# Patient Record
Sex: Female | Born: 1955 | Race: White | Hispanic: No | Marital: Single | Smoking: Current some day smoker
Health system: Southern US, Community
[De-identification: ages and names within clinical notes are randomized; demographics above are authoritative.]

## PROBLEM LIST (undated history)

## (undated) DIAGNOSIS — G4730 Sleep apnea, unspecified: Secondary | ICD-10-CM

## (undated) DIAGNOSIS — G709 Myoneural disorder, unspecified: Secondary | ICD-10-CM

## (undated) DIAGNOSIS — R42 Dizziness and giddiness: Secondary | ICD-10-CM

## (undated) DIAGNOSIS — I10 Essential (primary) hypertension: Secondary | ICD-10-CM

## (undated) DIAGNOSIS — F32A Depression, unspecified: Secondary | ICD-10-CM

## (undated) DIAGNOSIS — T4145XA Adverse effect of unspecified anesthetic, initial encounter: Secondary | ICD-10-CM

## (undated) DIAGNOSIS — G2581 Restless legs syndrome: Secondary | ICD-10-CM

## (undated) DIAGNOSIS — E119 Type 2 diabetes mellitus without complications: Secondary | ICD-10-CM

## (undated) DIAGNOSIS — T8859XA Other complications of anesthesia, initial encounter: Secondary | ICD-10-CM

## (undated) DIAGNOSIS — F329 Major depressive disorder, single episode, unspecified: Secondary | ICD-10-CM

## (undated) DIAGNOSIS — M1990 Unspecified osteoarthritis, unspecified site: Secondary | ICD-10-CM

## (undated) HISTORY — PX: DILATION AND CURETTAGE OF UTERUS: SHX78

## (undated) HISTORY — PX: TONSILLECTOMY: SUR1361

## (undated) HISTORY — PX: BREAST EXCISIONAL BIOPSY: SUR124

## (undated) HISTORY — DX: Myoneural disorder, unspecified: G70.9

## (undated) HISTORY — DX: Dizziness and giddiness: R42

---

## 2014-05-17 HISTORY — PX: OTHER SURGICAL HISTORY: SHX169

## 2015-09-15 ENCOUNTER — Other Ambulatory Visit (HOSPITAL_COMMUNITY): Payer: Self-pay | Admitting: Orthopedic Surgery

## 2015-09-15 DIAGNOSIS — M25561 Pain in right knee: Secondary | ICD-10-CM

## 2015-09-19 ENCOUNTER — Encounter (HOSPITAL_COMMUNITY)
Admission: RE | Admit: 2015-09-19 | Discharge: 2015-09-19 | Disposition: A | Discharge status: Home / Self Care | Payer: Worker's Compensation | Source: Ambulatory Visit | Attending: Orthopedic Surgery | Admitting: Orthopedic Surgery

## 2015-09-19 DIAGNOSIS — M25561 Pain in right knee: Secondary | ICD-10-CM

## 2015-09-19 MED ORDER — TECHNETIUM TC 99M MEDRONATE IV KIT
26.5000 | PACK | Freq: Once | INTRAVENOUS | Status: AC | PRN
  Administered 2015-09-19: 26.5 via INTRAVENOUS

## 2016-11-26 ENCOUNTER — Other Ambulatory Visit (HOSPITAL_COMMUNITY): Payer: Self-pay | Admitting: Emergency Medicine

## 2016-11-26 NOTE — Progress Notes (Signed)
LOV Dr Eloise Harman 09-14-16 on chart/epic

## 2016-11-26 NOTE — Patient Instructions (Addendum)
Fallen A Jarboe  11/26/2016   Your procedure is scheduled on: 12-06-16  Report to [facility] Main  Entrance   Report to admitting at 515AM   Call this number if you have problems the morning of surgery  [phone]   Remember: ONLY 1 PERSON MAY GO WITH YOU TO SHORT STAY TO GET  READY MORNING OF YOUR SURGERY.   Do not eat food or drink liquids After Midnight.      Take these medicines the morning of surgery with A SIP OF WATER: amlodipine(norvasc), citalopram(celexa), gabapentin     Remember to bring your CPAP mask and tubing to the hospital . The device will be provided for you!                               You may not have any metal on your body including hair pins and              piercings  Do not wear jewelry, make-up, lotions, powders or perfumes, deodorant             Do not wear nail polish.  Do not shave  48 hours prior to surgery.           Do not bring valuables to the hospital. [place] IS NOT             RESPONSIBLE   FOR VALUABLES.  Contacts, dentures or bridgework may not be worn into surgery.  Leave suitcase in the car. After surgery it may be brought to your room.                Please read over the following fact sheets you were given: _____________________________________________________________________   _____________________________________________________________________  How to Manage Your Diabetes Before and After Surgery  Why is it important to control my blood sugar before and after surgery? . Improving blood sugar levels before and after surgery helps healing and can limit problems. . A way of improving blood sugar control is eating a healthy diet by: o  Eating less sugar and carbohydrates o  Increasing activity/exercise o  Talking with your doctor about reaching your blood sugar goals . High blood sugars (greater than 180 mg/dL) can raise your risk of infections and slow your recovery, so you will need to focus on  controlling your diabetes during the weeks before surgery. . Make sure that the doctor who takes care of your diabetes knows about your planned surgery including the date and location.  How do I manage my blood sugar before surgery? . Check your blood sugar at least 4 times a day, starting 2 days before surgery, to make sure that the level is not too high or low. o Check your blood sugar the morning of your surgery when you wake up and every 2 hours until you get to the Short Stay unit. . If your blood sugar is less than 70 mg/dL, you will need to treat for low blood sugar: o Do not take insulin. o Treat a low blood sugar (less than 70 mg/dL) with  cup of clear juice (cranberry or apple), 4 glucose tablets, OR glucose gel. o Recheck blood sugar in 15 minutes after treatment (to make sure it is greater than 70 mg/dL). If your blood sugar is not greater than 70 mg/dL on recheck, call [phone] for  further instructions. . Report your blood sugar to the short stay nurse when you get to Short Stay.  . If you are admitted to the hospital after surgery: o Your blood sugar will be checked by the staff and you will probably be given insulin after surgery (instead of oral diabetes medicines) to make sure you have good blood sugar levels. o The goal for blood sugar control after surgery is 80-180 mg/dL.   WHAT DO I DO ABOUT MY DIABETES MEDICATION?   . THE NIGHT BEFORE SURGERY, take only 5 units of TRESIBA insulin at bedtime.       Patient Signature:  Date:   Nurse Signature:  Date:   Reviewed and Endorsed by [facility] Patient Education Committee, August 2015  [facility] - Preparing for Surgery Before surgery, you can play an important role.  Because skin is not sterile, your skin needs to be as free of germs as possible.  You can reduce the number of germs on your skin by washing with CHG (chlorahexidine gluconate) soap before surgery.  CHG is an antiseptic cleaner which kills germs and  bonds with the skin to continue killing germs even after washing. Please DO NOT use if you have an allergy to CHG or antibacterial soaps.  If your skin becomes reddened/irritated stop using the CHG and inform your nurse when you arrive at Short Stay. Do not shave (including legs and underarms) for at least 48 hours prior to the first CHG shower.  You may shave your face/neck. Please follow these instructions carefully:  1.  Shower with CHG Soap the night before surgery and the  morning of Surgery.  2.  If you choose to wash your hair, wash your hair first as usual with your  normal  shampoo.  3.  After you shampoo, rinse your hair and body thoroughly to remove the  shampoo.                           4.  Use CHG as you would any other liquid soap.  You can apply chg directly  to the skin and wash                       Gently with a scrungie or clean washcloth.  5.  Apply the CHG Soap to your body ONLY FROM THE NECK DOWN.   Do not use on face/ open                           Wound or open sores. Avoid contact with eyes, ears mouth and genitals (private parts).                       Wash face,  Genitals (private parts) with your normal soap.             6.  Wash thoroughly, paying special attention to the area where your surgery  will be performed.  7.  Thoroughly rinse your body with warm water from the neck down.  8.  DO NOT shower/wash with your normal soap after using and rinsing off  the CHG Soap.                9.  Pat yourself dry with a clean towel.            10.  Wear clean pajamas.  11.  Place clean sheets on your bed the night of your first shower and do not  sleep with pets. Day of Surgery : Do not apply any lotions/deodorants the morning of surgery.  Please wear clean clothes to the hospital/surgery center.  FAILURE TO FOLLOW THESE INSTRUCTIONS MAY RESULT IN THE CANCELLATION OF YOUR SURGERY PATIENT SIGNATURE_________________________________  NURSE  SIGNATURE__________________________________  ________________________________________________________________________   Adam Phenix  An incentive spirometer is a tool that can help keep your lungs clear and active. This tool measures how well you are filling your lungs with each breath. Taking long deep breaths may help reverse or decrease the chance of developing breathing (pulmonary) problems (especially infection) following:  A long period of time when you are unable to move or be active. BEFORE THE PROCEDURE   If the spirometer includes an indicator to show your [place] effort, your nurse or respiratory therapist will set it to a desired goal.  If possible, sit up straight or lean slightly forward. Try not to slouch.  Hold the incentive spirometer in an upright position. INSTRUCTIONS FOR USE  1. Sit on the edge of your bed if possible, or sit up as far as you can in bed or on a chair. 2. Hold the incentive spirometer in an upright position. 3. Breathe out normally. 4. Place the mouthpiece in your mouth and seal your lips tightly around it. 5. Breathe in slowly and as deeply as possible, raising the piston or the ball toward the top of the column. 6. Hold your breath for 3-5 seconds or for as long as possible. Allow the piston or ball to fall to the bottom of the column. 7. Remove the mouthpiece from your mouth and breathe out normally. 8. Rest for a few seconds and repeat Steps 1 through 7 at least 10 times every 1-2 hours when you are awake. Take your time and take a few normal breaths between deep breaths. 9. The spirometer may include an indicator to show your [place] effort. Use the indicator as a goal to work toward during each repetition. 10. After each set of 10 deep breaths, practice coughing to be sure your lungs are clear. If you have an incision (the cut made at the time of surgery), support your incision when coughing by placing a pillow or rolled up towels firmly  against it. Once you are able to get out of bed, walk around indoors and cough well. You may stop using the incentive spirometer when instructed by your caregiver.  RISKS AND COMPLICATIONS  Take your time so you do not get dizzy or light-headed.  If you are in pain, you may need to take or ask for pain medication before doing incentive spirometry. It is harder to take a deep breath if you are having pain. AFTER USE  Rest and breathe slowly and easily.  It can be helpful to keep track of a log of your progress. Your caregiver can provide you with a simple table to help with this. If you are using the spirometer at home, follow these instructions: [place] IF:   You are having difficultly using the spirometer.  You have trouble using the spirometer as often as instructed.  Your pain medication is not giving enough relief while using the spirometer.  You develop fever of 100.5 F (38.1 C) or higher. SEEK IMMEDIATE MEDICAL CARE IF:   You cough up bloody sputum that had not been present before.  You develop fever of 102 F (38.9 C)  or greater.  You develop worsening pain at or near the incision site. MAKE SURE YOU:   Understand these instructions.  Will watch your condition.  Will get help right away if you are not doing well or get worse. Document Released: 09/13/2006 Document Revised: 07/26/2011 Document Reviewed: 11/14/2006 Exit[employer] Patient Information 2014 Exit[employer], [place].   ________________________________________________________________________  WHAT IS A BLOOD TRANSFUSION? Blood Transfusion Information  A transfusion is the replacement of blood or some of its parts. Blood is made up of multiple cells which provide different functions.  Red blood cells carry oxygen and are used for blood loss replacement.  White blood cells fight against infection.  Platelets control bleeding.  Plasma helps clot blood.  Other blood products are available for  specialized needs, such as hemophilia or other clotting disorders. BEFORE THE TRANSFUSION  Who gives blood for transfusions?   Healthy volunteers who are fully evaluated to make sure their blood is safe. This is blood bank blood. Transfusion therapy is the safest it has ever been in the practice of medicine. Before blood is taken from a donor, a complete history is taken to make sure that person has no history of diseases nor engages in risky social behavior (examples are intravenous drug use or sexual activity with multiple partners). The donor's travel history is screened to minimize risk of transmitting infections, such as malaria. The donated blood is tested for signs of infectious diseases, such as HIV and hepatitis. The blood is then tested to be sure it is compatible with you in order to minimize the chance of a transfusion reaction. If you or a relative donates blood, this is often done in anticipation of surgery and is not appropriate for emergency situations. It takes many days to process the donated blood. RISKS AND COMPLICATIONS Although transfusion therapy is very safe and saves many lives, the main dangers of transfusion include:   Getting an infectious disease.  Developing a transfusion reaction. This is an allergic reaction to something in the blood you were given. Every precaution is taken to prevent this. The decision to have a blood transfusion has been considered carefully by your caregiver before blood is given. Blood is not given unless the benefits outweigh the risks. AFTER THE TRANSFUSION  Right after receiving a blood transfusion, you will usually feel much better and more energetic. This is especially true if your red blood cells have gotten low (anemic). The transfusion raises the level of the red blood cells which carry oxygen, and this usually causes an energy increase.  The nurse administering the transfusion will monitor you carefully for complications. HOME CARE  INSTRUCTIONS  No special instructions are needed after a transfusion. You may find your energy is better. Speak with your caregiver about any limitations on activity for underlying diseases you may have. SEEK MEDICAL CARE IF:   Your condition is not improving after your transfusion.  You develop redness or irritation at the intravenous (IV) site. SEEK IMMEDIATE MEDICAL CARE IF:  Any of the following symptoms occur over the next 12 hours:  Shaking chills.  You have a temperature by mouth above 102 F (38.9 C), not controlled by medicine.  Chest, back, or muscle pain.  People around you feel you are not acting correctly or are confused.  Shortness of breath or difficulty breathing.  Dizziness and fainting.  You get a rash or develop hives.  You have a decrease in urine output.  Your urine turns a dark color or changes to pink, red,  or brown. Any of the following symptoms occur over the next 10 days:  You have a temperature by mouth above 102 F (38.9 C), not controlled by medicine.  Shortness of breath.  Weakness after normal activity.  The white part of the eye turns yellow (jaundice).  You have a decrease in the amount of urine or are urinating less often.  Your urine turns a dark color or changes to pink, red, or brown. Document Released: 04/30/2000 Document Revised: 07/26/2011 Document Reviewed: 12/18/2007 [facility] Patient Information 2014 [place], [place].  _______________________________________________________________________

## 2016-11-29 ENCOUNTER — Ambulatory Visit (HOSPITAL_COMMUNITY)
Admission: RE | Admit: 2016-11-29 | Discharge: 2016-11-29 | Disposition: A | Discharge status: Home / Self Care | Payer: Worker's Compensation | Source: Ambulatory Visit | Attending: Anesthesiology | Admitting: Anesthesiology

## 2016-11-29 ENCOUNTER — Encounter (HOSPITAL_COMMUNITY)
Admission: RE | Admit: 2016-11-29 | Discharge: 2016-11-29 | Disposition: A | Discharge status: Home / Self Care | Payer: Worker's Compensation | Source: Ambulatory Visit | Attending: Orthopedic Surgery | Admitting: Orthopedic Surgery

## 2016-11-29 ENCOUNTER — Encounter (HOSPITAL_COMMUNITY): Payer: Self-pay

## 2016-11-29 ENCOUNTER — Encounter (INDEPENDENT_AMBULATORY_CARE_PROVIDER_SITE_OTHER): Payer: Self-pay

## 2016-11-29 DIAGNOSIS — I10 Essential (primary) hypertension: Secondary | ICD-10-CM | POA: Diagnosis not present

## 2016-11-29 DIAGNOSIS — Z209 Contact with and (suspected) exposure to unspecified communicable disease: Secondary | ICD-10-CM

## 2016-11-29 DIAGNOSIS — E119 Type 2 diabetes mellitus without complications: Secondary | ICD-10-CM | POA: Diagnosis not present

## 2016-11-29 DIAGNOSIS — T8484XA Pain due to internal orthopedic prosthetic devices, implants and grafts, initial encounter: Secondary | ICD-10-CM | POA: Insufficient documentation

## 2016-11-29 DIAGNOSIS — F329 Major depressive disorder, single episode, unspecified: Secondary | ICD-10-CM | POA: Insufficient documentation

## 2016-11-29 DIAGNOSIS — Y831 Surgical operation with implant of artificial internal device as the cause of abnormal reaction of the patient, or of later complication, without mention of misadventure at the time of the procedure: Secondary | ICD-10-CM | POA: Diagnosis not present

## 2016-11-29 DIAGNOSIS — Z79899 Other long term (current) drug therapy: Secondary | ICD-10-CM | POA: Insufficient documentation

## 2016-11-29 DIAGNOSIS — G2581 Restless legs syndrome: Secondary | ICD-10-CM | POA: Insufficient documentation

## 2016-11-29 HISTORY — DX: Sleep apnea, unspecified: G47.30

## 2016-11-29 HISTORY — DX: Other complications of anesthesia, initial encounter: T88.59XA

## 2016-11-29 HISTORY — DX: Restless legs syndrome: G25.81

## 2016-11-29 HISTORY — DX: Unspecified osteoarthritis, unspecified site: M19.90

## 2016-11-29 HISTORY — DX: Major depressive disorder, single episode, unspecified: F32.9

## 2016-11-29 HISTORY — DX: Essential (primary) hypertension: I10

## 2016-11-29 HISTORY — DX: Type 2 diabetes mellitus without complications: E11.9

## 2016-11-29 HISTORY — DX: Depression, unspecified: F32.A

## 2016-11-29 HISTORY — DX: Adverse effect of unspecified anesthetic, initial encounter: T41.45XA

## 2016-11-29 LAB — CBC
HCT: 44 % (ref 36.0–46.0)
Hemoglobin: 15.1 g/dL — ABNORMAL HIGH (ref 12.0–15.0)
MCH: 30.8 pg (ref 26.0–34.0)
MCHC: 34.3 g/dL (ref 30.0–36.0)
MCV: 89.8 fL (ref 78.0–100.0)
PLATELETS: 160 10*3/uL (ref 150–400)
RBC: 4.9 MIL/uL (ref 3.87–5.11)
RDW: 13.3 % (ref 11.5–15.5)
WBC: 5.8 10*3/uL (ref 4.0–10.5)

## 2016-11-29 LAB — BASIC METABOLIC PANEL
Anion gap: 7 (ref 5–15)
BUN: 11 mg/dL (ref 6–20)
CALCIUM: 9.5 mg/dL (ref 8.9–10.3)
CO2: 27 mmol/L (ref 22–32)
CREATININE: 0.8 mg/dL (ref 0.44–1.00)
Chloride: 106 mmol/L (ref 101–111)
GFR calc non Af Amer: >60 mL/min (ref >60)
Glucose, Bld: 116 mg/dL — ABNORMAL HIGH (ref 65–99)
Potassium: 4.4 mmol/L (ref 3.5–5.1)
SODIUM: 140 mmol/L (ref 135–145)

## 2016-11-29 LAB — GLUCOSE, CAPILLARY: GLUCOSE-CAPILLARY: 121 mg/dL — AB (ref 65–99)

## 2016-11-29 LAB — SURGICAL PCR SCREEN
MRSA, PCR: NEGATIVE
Staphylococcus aureus: NEGATIVE

## 2016-11-29 LAB — ABO/RH: ABO/RH(D): A POS

## 2016-11-30 LAB — HEMOGLOBIN A1C
Hgb A1c MFr Bld: 6.7 % — ABNORMAL HIGH (ref 4.8–5.6)
Mean Plasma Glucose: 146 mg/dL

## 2016-12-02 NOTE — Progress Notes (Signed)
RN called patient to update about surgery time change. Patient did not answer, RN LVMM directing patient to call back for updates on upcoming procedure .

## 2016-12-02 NOTE — Progress Notes (Signed)
Patient called back and RN informed her of new time of surgery at 715am and that she needed to arrive to 1st floor short stay area no later than 515am to check in and that she would need to stop all oral food and drink after midnight prior to her surgery day . Patient verbalized understanding. Patient also informed RN that since her pre-op visit she had seen her PCP for c/o sore throat and runny nose. Strep result negative and PCP suggests upper nasal congestion is related to CPAP mask and instructed her to start on mucinex as needed, patient wondering if this is an okay medication for surgery. RN informed she may take mucinex as needed day of surgery as instructed by her PCP with only a small sip of water. Pt agreeable to this .

## 2016-12-02 NOTE — H&P (Signed)
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  Subjective:  Chief Complaint:  Right knee pain S/P UKR  HPI: Kelli Ruiz, 61 y.o. 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 The indications for the revision of the total knee arthroplasty are progressive or substantial perporsthetic bone loss. Onset of symptoms was gradual starting 2+ years ago with gradually worsening course since that time.  Prior procedures on the right knee(s) include unicompartmental arthroplasty.  Patient currently rates pain in the right knee(s) at 7 out of 10 with activity. There is worsening of pain with activity and weight bearing, pain that interferes with activities of daily living, pain with passive range of motion, crepitus and joint swelling.  Patient has evidence of periarticular osteophytes and joint space narrowing by imaging studies. This condition presents safety issues increasing the risk of falls.  There is no current active infection.   Risks, benefits and expectations were discussed with the patient.  Risks including but not limited to the risk of anesthesia, blood clots, nerve damage, blood vessel damage, failure of the prosthesis, infection and up to and including death.  Patient understand the risks, benefits and expectations and wishes to proceed with surgery.   PCP: Brayton Layman, MD  D/C Plans:       Home   Post-op Meds:       No Rx given   Tranexamic Acid:      To be given - IV   Decadron:      Is to be given  FYI:     ASA  Norco  CPAP  DM - Insulin only at night if BGL over 120  DME:    Rx given for - RW and  3-n-1  PT:   OPPT Rx given    Past Medical History:  Diagnosis Date  . Arthritis   . Complication of anesthesia    " they had a little bit of a hard time waking me after they took a nerve out of my leg"   . Depression   . Diabetes mellitus without complication (HCC)   . Hypertension   . RLS (restless legs syndrome)   . Sleep apnea     Past Surgical History:  Procedure Laterality Date  . DILATION AND CURETTAGE OF UTERUS  12- 2014 or 2015  . TONSILLECTOMY    . unicompartmental knee  2016    No prescriptions prior to admission.   No Known Allergies   Social History  Substance Use Topics  . Smoking status: Current Every Day Smoker    Years: 20.00    Types: Cigarettes  . Smokeless tobacco: Never Used  . Alcohol use No       Review of Systems  Constitutional: Negative.   HENT: Negative.   Eyes: Negative.   Respiratory: Negative.   Cardiovascular: Negative.   Gastrointestinal: Negative.   Genitourinary: Negative.   Musculoskeletal: Positive for joint pain.  Skin: Negative.   Neurological: Negative.   Endo/Heme/Allergies: Negative.   Psychiatric/Behavioral: Positive for depression.     Objective:  Physical Exam  Constitutional: She is oriented to person, place, and time. She appears well-developed.  HENT:  Head: Normocephalic.  Eyes: Pupils are  equal, round, and reactive to light.  Neck: Neck supple. No JVD present. No tracheal deviation present. No thyromegaly present.  Cardiovascular: Normal rate, regular rhythm and intact distal pulses.   Respiratory: Effort normal and breath sounds normal. No respiratory distress. She has no wheezes.  GI: Soft. There is no tenderness. There is no guarding.  Musculoskeletal:       Right knee: She exhibits decreased range of motion, laceration (healed previous incision) and bony tenderness. She exhibits no ecchymosis, no deformity and no erythema. Tenderness found.  Lymphadenopathy:    She has no cervical adenopathy.   Neurological: She is alert and oriented to person, place, and time.  Skin: Skin is warm and dry.  Psychiatric: She has a normal mood and affect.      Labs:  Estimated body mass index is 31.3 kg/m as calculated from the following:   Height as of 11/29/16: 5' 4.5" (1.638 m).   Weight as of 11/29/16: 84 kg (185 lb 3.2 oz).  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  The bone quality appears to be good for age and reported activity level. There is previous UKR.  Assessment/Plan:  Right knee with failed previous arthroplasty.   The patient history, physical examination, clinical judgment of the provider and imaging studies are consistent with failure of the right knee(s), previous unicompartmental  knee arthroplasty. Revision total knee arthroplasty is deemed medically necessary. The treatment options including medical management, injection therapy, arthroscopy and revision arthroplasty were discussed at length. The risks and benefits of revision total knee arthroplasty were presented and reviewed. The risks due to aseptic loosening, infection, stiffness, patella tracking problems, thromboembolic complications and other imponderables were discussed. The patient acknowledged the explanation, agreed to proceed with the plan and consent was signed. Patient is being admitted for inpatient treatment for surgery, pain control, PT, OT, prophylactic antibiotics, VTE prophylaxis, progressive ambulation and ADL's and discharge planning.The patient is planning to be discharged home.    Anastasio Auerbach Aaliah Jorgenson   PA-C  12/02/2016, 2:02 PM

## 2016-12-04 ENCOUNTER — Encounter (HOSPITAL_COMMUNITY): Payer: Self-pay | Admitting: Certified Registered Nurse Anesthetist

## 2016-12-06 ENCOUNTER — Inpatient Hospital Stay (HOSPITAL_COMMUNITY)
Admission: RE | Admit: 2016-12-06 | Payer: Worker's Compensation | Source: Ambulatory Visit | Admitting: Orthopedic Surgery

## 2016-12-06 ENCOUNTER — Encounter (HOSPITAL_COMMUNITY): Admission: RE | Payer: Self-pay | Source: Ambulatory Visit

## 2016-12-06 LAB — TYPE AND SCREEN
ABO/RH(D): A POS
Antibody Screen: NEGATIVE

## 2016-12-06 SURGERY — CONVERSION, ARTHROPLASTY, KNEE, PARTIAL, TO TOTAL KNEE ARTHROPLASTY
Anesthesia: Spinal | Laterality: Right

## 2016-12-06 MED ORDER — CEFAZOLIN SODIUM-DEXTROSE 2-4 GM/100ML-% IV SOLN: 2.0000 g | INTRAVENOUS | Status: DC

## 2016-12-06 MED ORDER — CHLORHEXIDINE GLUCONATE 4 % EX LIQD: 60.0000 mL | Freq: Once | CUTANEOUS | Status: DC

## 2017-01-12 NOTE — Patient Instructions (Addendum)
Kelli Ruiz  01/12/2017   Your procedure is scheduled on:01-18-17    Report to [facility] Main  Entrance Report to admitting at 9:00 AM   Call this number if you have problems the morning of surgery  [phone]   Remember: ONLY 1 PERSON MAY GO WITH YOU TO SHORT STAY TO GET  READY MORNING OF YOUR SURGERY.  Do not eat food or drink liquids :After Midnight.     Take these medicines the morning of surgery with A SIP OF WATER: Amlodopine (Norvasc), Citalopram (Celexa) and Gabapentin (Neurontin)  DO NOT TAKE ANY DIABETIC MEDICATIONS DAY OF YOUR SURGERY                               You may not have any metal on your body including hair pins and              piercings  Do not wear jewelry, make-up, lotions, powders or perfumes, deodorant             Do not wear nail polish.  Do not shave  48 hours prior to surgery.                 Do not bring valuables to the hospital. [facility] IS NOT             RESPONSIBLE   FOR VALUABLES.  Contacts, dentures or bridgework may not be worn into surgery.  Leave suitcase in the car. After surgery it may be brought to your room.   Please bring your mask and tubing on the day of your procedure                Please read over the following fact sheets you were given: _____________________________________________________________________  How to Manage Your Diabetes Before and After Surgery  Why is it important to control my blood sugar before and after surgery? . Improving blood sugar levels before and after surgery helps healing and can limit problems. . A way of improving blood sugar control is eating a healthy diet by: o  Eating less sugar and carbohydrates o  Increasing activity/exercise o  Talking with your doctor about reaching your blood sugar goals . High blood sugars (greater than 180 mg/dL) can raise your risk of infections and slow your recovery, so you will need to focus on controlling your diabetes during the  weeks before surgery. . Make sure that the doctor who takes care of your diabetes knows about your planned surgery including the date and location.  How do I manage my blood sugar before surgery? . Check your blood sugar at least 4 times a day, starting 2 days before surgery, to make sure that the level is not too high or low. o Check your blood sugar the morning of your surgery when you wake up and every 2 hours until you get to the Short Stay unit. . If your blood sugar is less than 70 mg/dL, you will need to treat for low blood sugar: o Do not take insulin. o Treat a low blood sugar (less than 70 mg/dL) with  cup of clear juice (cranberry or apple), 4 glucose tablets, OR glucose gel. o Recheck blood sugar in 15 minutes after treatment (to make sure it is greater than 70 mg/dL). If your blood sugar is not greater than  70 mg/dL on recheck, call [phone] for further instructions. . Report your blood sugar to the short stay nurse when you get to Short Stay.  . If you are admitted to the hospital after surgery: o Your blood sugar will be checked by the staff and you will probably be given insulin after surgery (instead of oral diabetes medicines) to make sure you have good blood sugar levels. o The goal for blood sugar control after surgery is 80-180 mg/dL.   WHAT DO I DO ABOUT MY DIABETES MEDICATION?  Marland Kitchen Do not take oral diabetes medicines (pills) the morning of surgery.  . THE NIGHT BEFORE SURGERY, take 5 units of your Tresiba (Degludec) insulin.        Patient Signature:  Date:   Nurse Signature:  Date:   Reviewed and Endorsed by [facility] Patient Education Committee, August 2015           [facility] - Preparing for Surgery Before surgery, you can play an important role.  Because skin is not sterile, your skin needs to be as free of germs as possible.  You can reduce the number of germs on your skin by washing with CHG (chlorahexidine gluconate) soap before surgery.  CHG is  an antiseptic cleaner which kills germs and bonds with the skin to continue killing germs even after washing. Please DO NOT use if you have an allergy to CHG or antibacterial soaps.  If your skin becomes reddened/irritated stop using the CHG and inform your nurse when you arrive at Short Stay. Do not shave (including legs and underarms) for at least 48 hours prior to the first CHG shower.  You may shave your face/neck. Please follow these instructions carefully:  1.  Shower with CHG Soap the night before surgery and the  morning of Surgery.  2.  If you choose to wash your hair, wash your hair first as usual with your  normal  shampoo.  3.  After you shampoo, rinse your hair and body thoroughly to remove the  shampoo.                           4.  Use CHG as you would any other liquid soap.  You can apply chg directly  to the skin and wash                       Gently with a scrungie or clean washcloth.  5.  Apply the CHG Soap to your body ONLY FROM THE NECK DOWN.   Do not use on face/ open                           Wound or open sores. Avoid contact with eyes, ears mouth and genitals (private parts).                       Wash face,  Genitals (private parts) with your normal soap.             6.  Wash thoroughly, paying special attention to the area where your surgery  will be performed.  7.  Thoroughly rinse your body with warm water from the neck down.  8.  DO NOT shower/wash with your normal soap after using and rinsing off  the CHG Soap.  9.  Pat yourself dry with a clean towel.            10.  Wear clean pajamas.            11.  Place clean sheets on your bed the night of your first shower and do not  sleep with pets. Day of Surgery : Do not apply any lotions/deodorants the morning of surgery.  Please wear clean clothes to the hospital/surgery center.  FAILURE TO FOLLOW THESE INSTRUCTIONS MAY RESULT IN THE CANCELLATION OF YOUR SURGERY PATIENT  SIGNATURE_________________________________  NURSE SIGNATURE__________________________________  ________________________________________________________________________   Adam Phenix  An incentive spirometer is a tool that can help keep your lungs clear and active. This tool measures how well you are filling your lungs with each breath. Taking long deep breaths may help reverse or decrease the chance of developing breathing (pulmonary) problems (especially infection) following:  A long period of time when you are unable to move or be active. BEFORE THE PROCEDURE   If the spirometer includes an indicator to show your best effort, your nurse or respiratory therapist will set it to a desired goal.  If possible, sit up straight or lean slightly forward. Try not to slouch.  Hold the incentive spirometer in an upright position. INSTRUCTIONS FOR USE  1. Sit on the edge of your bed if possible, or sit up as far as you can in bed or on a chair. 2. Hold the incentive spirometer in an upright position. 3. Breathe out normally. 4. Place the mouthpiece in your mouth and seal your lips tightly around it. 5. Breathe in slowly and as deeply as possible, raising the piston or the ball toward the top of the column. 6. Hold your breath for 3-5 seconds or for as long as possible. Allow the piston or ball to fall to the bottom of the column. 7. Remove the mouthpiece from your mouth and breathe out normally. 8. Rest for a few seconds and repeat Steps 1 through 7 at least 10 times every 1-2 hours when you are awake. Take your time and take a few normal breaths between deep breaths. 9. The spirometer may include an indicator to show your best effort. Use the indicator as a goal to work toward during each repetition. 10. After each set of 10 deep breaths, practice coughing to be sure your lungs are clear. If you have an incision (the cut made at the time of surgery), support your incision when coughing  by placing a pillow or rolled up towels firmly against it. Once you are able to get out of bed, walk around indoors and cough well. You may stop using the incentive spirometer when instructed by your caregiver.  RISKS AND COMPLICATIONS  Take your time so you do not get dizzy or light-headed.  If you are in pain, you may need to take or ask for pain medication before doing incentive spirometry. It is harder to take a deep breath if you are having pain. AFTER USE  Rest and breathe slowly and easily.  It can be helpful to keep track of a log of your progress. Your caregiver can provide you with a simple table to help with this. If you are using the spirometer at home, follow these instructions: [place] IF:   You are having difficultly using the spirometer.  You have trouble using the spirometer as often as instructed.  Your pain medication is not giving enough relief while using the spirometer.  You  develop fever of 100.5 F (38.1 C) or higher. SEEK IMMEDIATE MEDICAL CARE IF:   You cough up bloody sputum that had not been present before.  You develop fever of 102 F (38.9 C) or greater.  You develop worsening pain at or near the incision site. MAKE SURE YOU:   Understand these instructions.  Will watch your condition.  Will get help right away if you are not doing well or get worse. Document Released: 09/13/2006 Document Revised: 07/26/2011 Document Reviewed: 11/14/2006 Exit[employer] Patient Information 2014 Exit[employer], [place].   ________________________________________________________________________  WHAT IS A BLOOD TRANSFUSION? Blood Transfusion Information  A transfusion is the replacement of blood or some of its parts. Blood is made up of multiple cells which provide different functions.  Red blood cells carry oxygen and are used for blood loss replacement.  White blood cells fight against infection.  Platelets control bleeding.  Plasma helps clot  blood.  Other blood products are available for specialized needs, such as hemophilia or other clotting disorders. BEFORE THE TRANSFUSION  Who gives blood for transfusions?   Healthy volunteers who are fully evaluated to make sure their blood is safe. This is blood bank blood. Transfusion therapy is the safest it has ever been in the practice of medicine. Before blood is taken from a donor, a complete history is taken to make sure that person has no history of diseases nor engages in risky social behavior (examples are intravenous drug use or sexual activity with multiple partners). The donor's travel history is screened to minimize risk of transmitting infections, such as malaria. The donated blood is tested for signs of infectious diseases, such as HIV and hepatitis. The blood is then tested to be sure it is compatible with you in order to minimize the chance of a transfusion reaction. If you or a relative donates blood, this is often done in anticipation of surgery and is not appropriate for emergency situations. It takes many days to process the donated blood. RISKS AND COMPLICATIONS Although transfusion therapy is very safe and saves many lives, the main dangers of transfusion include:   Getting an infectious disease.  Developing a transfusion reaction. This is an allergic reaction to something in the blood you were given. Every precaution is taken to prevent this. The decision to have a blood transfusion has been considered carefully by your caregiver before blood is given. Blood is not given unless the benefits outweigh the risks. AFTER THE TRANSFUSION  Right after receiving a blood transfusion, you will usually feel much better and more energetic. This is especially true if your red blood cells have gotten low (anemic). The transfusion raises the level of the red blood cells which carry oxygen, and this usually causes an energy increase.  The nurse administering the transfusion will monitor  you carefully for complications. HOME CARE INSTRUCTIONS  No special instructions are needed after a transfusion. You may find your energy is better. Speak with your caregiver about any limitations on activity for underlying diseases you may have. SEEK MEDICAL CARE IF:   Your condition is not improving after your transfusion.  You develop redness or irritation at the intravenous (IV) site. SEEK IMMEDIATE MEDICAL CARE IF:  Any of the following symptoms occur over the next 12 hours:  Shaking chills.  You have a temperature by mouth above 102 F (38.9 C), not controlled by medicine.  Chest, back, or muscle pain.  People around you feel you are not acting correctly or are confused.  Shortness of  breath or difficulty breathing.  Dizziness and fainting.  You get a rash or develop hives.  You have a decrease in urine output.  Your urine turns a dark color or changes to pink, red, or brown. Any of the following symptoms occur over the next 10 days:  You have a temperature by mouth above 102 F (38.9 C), not controlled by medicine.  Shortness of breath.  Weakness after normal activity.  The white part of the eye turns yellow (jaundice).  You have a decrease in the amount of urine or are urinating less often.  Your urine turns a dark color or changes to pink, red, or brown. Document Released: 04/30/2000 Document Revised: 07/26/2011 Document Reviewed: 12/18/2007 [facility] Patient Information 2014 [place], [place].  _______________________________________________________________________

## 2017-01-12 NOTE — Progress Notes (Signed)
11-29-16 (EPIC) EKG, CXR,  HGA1C 6.7

## 2017-01-13 ENCOUNTER — Encounter (HOSPITAL_COMMUNITY): Payer: Self-pay

## 2017-01-13 ENCOUNTER — Encounter (INDEPENDENT_AMBULATORY_CARE_PROVIDER_SITE_OTHER): Payer: Self-pay

## 2017-01-13 ENCOUNTER — Encounter (HOSPITAL_COMMUNITY)
Admission: RE | Admit: 2017-01-13 | Discharge: 2017-01-13 | Disposition: A | Discharge status: Home / Self Care | Payer: Worker's Compensation | Source: Ambulatory Visit | Attending: Orthopedic Surgery | Admitting: Orthopedic Surgery

## 2017-01-13 DIAGNOSIS — E119 Type 2 diabetes mellitus without complications: Secondary | ICD-10-CM | POA: Insufficient documentation

## 2017-01-13 DIAGNOSIS — Z01812 Encounter for preprocedural laboratory examination: Secondary | ICD-10-CM | POA: Insufficient documentation

## 2017-01-13 DIAGNOSIS — M1990 Unspecified osteoarthritis, unspecified site: Secondary | ICD-10-CM | POA: Insufficient documentation

## 2017-01-13 DIAGNOSIS — G4730 Sleep apnea, unspecified: Secondary | ICD-10-CM | POA: Insufficient documentation

## 2017-01-13 DIAGNOSIS — G2581 Restless legs syndrome: Secondary | ICD-10-CM | POA: Insufficient documentation

## 2017-01-13 DIAGNOSIS — T84092A Other mechanical complication of internal right knee prosthesis, initial encounter: Secondary | ICD-10-CM | POA: Diagnosis not present

## 2017-01-13 DIAGNOSIS — I10 Essential (primary) hypertension: Secondary | ICD-10-CM | POA: Insufficient documentation

## 2017-01-13 DIAGNOSIS — F329 Major depressive disorder, single episode, unspecified: Secondary | ICD-10-CM | POA: Insufficient documentation

## 2017-01-13 LAB — BASIC METABOLIC PANEL
Anion gap: 8 (ref 5–15)
BUN: 13 mg/dL (ref 6–20)
CALCIUM: 9.8 mg/dL (ref 8.9–10.3)
CO2: 28 mmol/L (ref 22–32)
CREATININE: 0.77 mg/dL (ref 0.44–1.00)
Chloride: 103 mmol/L (ref 101–111)
GFR calc Af Amer: >60 mL/min (ref >60)
GFR calc non Af Amer: >60 mL/min (ref >60)
GLUCOSE: 146 mg/dL — AB (ref 65–99)
Potassium: 4.3 mmol/L (ref 3.5–5.1)
Sodium: 139 mmol/L (ref 135–145)

## 2017-01-13 LAB — CBC
HCT: 45.2 % (ref 36.0–46.0)
HEMOGLOBIN: 15.4 g/dL — AB (ref 12.0–15.0)
MCH: 30.6 pg (ref 26.0–34.0)
MCHC: 34.1 g/dL (ref 30.0–36.0)
MCV: 89.7 fL (ref 78.0–100.0)
Platelets: 183 10*3/uL (ref 150–400)
RBC: 5.04 MIL/uL (ref 3.87–5.11)
RDW: 13.7 % (ref 11.5–15.5)
WBC: 9.3 10*3/uL (ref 4.0–10.5)

## 2017-01-13 LAB — GLUCOSE, CAPILLARY: Glucose-Capillary: 180 mg/dL — ABNORMAL HIGH (ref 65–99)

## 2017-01-13 LAB — SURGICAL PCR SCREEN
MRSA, PCR: NEGATIVE
Staphylococcus aureus: NEGATIVE

## 2017-01-17 NOTE — H&P (Signed)
TOTAL KNEE REVISION ADMISSION H&P  Patient is being admitted for conversion of right UKR to TKA.  Subjective:  Chief Complaint:   Right knee pain s/p UKR  HPI: Kelli Ruiz, 61 y.o. 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 The indications for the revision of the total knee arthroplasty are bearing surface wear leading to symptomatic synovitis. Onset of symptoms was gradual starting 3+ years ago with gradually worsening course since that time.  Prior procedures on the right knee(s) include unicompartmental arthroplasty in [place] in 2015.  Patient currently rates pain in the right knee(s) at 8 out of 10 with activity. There is night pain, worsening of pain with activity and weight bearing, pain that interferes with activities of daily living, pain with passive range of motion and joint swelling.  Patient has evidence of previous UKR by imaging studies. This condition presents safety issues increasing the risk of falls.  There is no current active infection.   Risks, benefits and expectations were discussed with the patient.  Risks including but not limited to the risk of anesthesia, blood clots, nerve damage, blood vessel damage, failure of the prosthesis, infection and up to and including death.  Patient understand the risks, benefits and expectations and wishes to proceed with surgery.   PCP: Brayton Layman, MD  D/C Plans:       Home  Post-op Meds:       No Rx given   Tranexamic Acid:      To be given - IV   Decadron:      Is to be given  FYI:     ASA  Norco  CPAP  DME:  Patient already has equipment  PT:   Rx given for OPPT    Past Medical History:  Diagnosis Date  . Arthritis   . Complication of  anesthesia    " they had a little bit of a hard time waking me after they took a nerve out of my leg"   . Depression   . Diabetes mellitus without complication (HCC)   . Hypertension   . RLS (restless legs syndrome)   . Sleep apnea     Past Surgical History:  Procedure Laterality Date  . DILATION AND CURETTAGE OF UTERUS  12- 2014 or 2015  . TONSILLECTOMY    . unicompartmental knee  2016    No prescriptions prior to admission.   No Known Allergies   Social History  Substance Use Topics  . Smoking status: Former Smoker    Years: 20.00    Types: Cigarettes    Quit date: 01/09/2017  . Smokeless tobacco: Never Used     Comment: Pt uses vapor  . Alcohol use No       Review of Systems  Constitutional: Negative.   HENT: Negative.   Eyes: Negative.   Respiratory: Negative.   Cardiovascular: Negative.   Gastrointestinal: Negative.   Genitourinary: Negative.   Musculoskeletal: Positive for joint pain.  Skin: Negative.   Neurological: Negative.   Endo/Heme/Allergies: Negative.   Psychiatric/Behavioral: Positive for depression.     Objective:  Physical Exam  Constitutional: She is oriented to person, place, and time. She appears well-developed.  HENT:  Head: Normocephalic.  Eyes: Pupils are equal, round, and reactive to light.  Neck:  Neck supple. No JVD present. No tracheal deviation present. No thyromegaly present.  Cardiovascular: Normal rate, regular rhythm and intact distal pulses.   Respiratory: Effort normal and breath sounds normal. No respiratory distress. She has no wheezes.  GI: Soft. There is no tenderness. There is no guarding.  Musculoskeletal:       Right knee: She exhibits decreased range of motion, swelling and bony tenderness. She exhibits no ecchymosis, no deformity, no laceration and no erythema. Tenderness found.  Lymphadenopathy:    She has no cervical adenopathy.  Neurological: She is alert and oriented to person, place, and time.  Skin: Skin is  warm and dry.  Psychiatric: She has a normal mood and affect.     Labs:  Estimated body mass index is 31.43 kg/m as calculated from the following:   Height as of 01/13/17: 5\' 4"  (1.626 m).   Weight as of 01/13/17: 83.1 kg (183 lb 2 oz).  Imaging Review Plain radiographs demonstrate previous UKR  of the right knee(s). The bone quality appears to be good for age and reported activity level.  Assessment/Plan:  Right knee with failed previous arthroplasty.   The patient history, physical examination, clinical judgment of the provider and imaging studies are consistent with failure of the right knee(s), previous total knee arthroplasty. Revision total knee arthroplasty is deemed medically necessary. The treatment options including medical management, injection therapy, arthroscopy and revision arthroplasty were discussed at length. The risks and benefits of revision total knee arthroplasty were presented and reviewed. The risks due to aseptic loosening, infection, stiffness, patella tracking problems, thromboembolic complications and other imponderables were discussed. The patient acknowledged the explanation, agreed to proceed with the plan and consent was signed. Patient is being admitted for inpatient treatment for surgery, pain control, PT, OT, prophylactic antibiotics, VTE prophylaxis, progressive ambulation and ADL's and discharge planning.The patient is planning to be discharged home.   01/15/17 [name]   PA-C  01/17/2017, 10:59 PM

## 2017-01-18 ENCOUNTER — Encounter (HOSPITAL_COMMUNITY): Payer: Self-pay | Admitting: *Deleted

## 2017-01-18 ENCOUNTER — Inpatient Hospital Stay (HOSPITAL_COMMUNITY): Payer: Worker's Compensation | Admitting: Anesthesiology

## 2017-01-18 ENCOUNTER — Inpatient Hospital Stay (HOSPITAL_COMMUNITY)
Admission: RE | Admit: 2017-01-18 | Discharge: 2017-01-19 | DRG: 468 | Disposition: A | Discharge status: Home / Self Care | Payer: Worker's Compensation | Attending: Orthopedic Surgery | Admitting: Orthopedic Surgery

## 2017-01-18 ENCOUNTER — Encounter (HOSPITAL_COMMUNITY)
Admission: RE | Disposition: A | Discharge status: Home / Self Care | Payer: Self-pay | Source: Home / Self Care | Attending: Orthopedic Surgery

## 2017-01-18 DIAGNOSIS — Z96651 Presence of right artificial knee joint: Secondary | ICD-10-CM

## 2017-01-18 DIAGNOSIS — M1991 Primary osteoarthritis, unspecified site: Secondary | ICD-10-CM | POA: Diagnosis present

## 2017-01-18 DIAGNOSIS — Z6831 Body mass index (BMI) 31.0-31.9, adult: Secondary | ICD-10-CM | POA: Diagnosis not present

## 2017-01-18 DIAGNOSIS — T84062A Wear of articular bearing surface of internal prosthetic right knee joint, initial encounter: Principal | ICD-10-CM | POA: Diagnosis present

## 2017-01-18 DIAGNOSIS — E669 Obesity, unspecified: Secondary | ICD-10-CM | POA: Diagnosis present

## 2017-01-18 DIAGNOSIS — F329 Major depressive disorder, single episode, unspecified: Secondary | ICD-10-CM | POA: Diagnosis present

## 2017-01-18 DIAGNOSIS — Y832 Surgical operation with anastomosis, bypass or graft as the cause of abnormal reaction of the patient, or of later complication, without mention of misadventure at the time of the procedure: Secondary | ICD-10-CM | POA: Diagnosis present

## 2017-01-18 DIAGNOSIS — E119 Type 2 diabetes mellitus without complications: Secondary | ICD-10-CM | POA: Diagnosis present

## 2017-01-18 DIAGNOSIS — I10 Essential (primary) hypertension: Secondary | ICD-10-CM | POA: Diagnosis present

## 2017-01-18 DIAGNOSIS — G2581 Restless legs syndrome: Secondary | ICD-10-CM | POA: Diagnosis present

## 2017-01-18 DIAGNOSIS — Z87891 Personal history of nicotine dependence: Secondary | ICD-10-CM | POA: Diagnosis not present

## 2017-01-18 DIAGNOSIS — J449 Chronic obstructive pulmonary disease, unspecified: Secondary | ICD-10-CM | POA: Diagnosis present

## 2017-01-18 DIAGNOSIS — G4730 Sleep apnea, unspecified: Secondary | ICD-10-CM | POA: Diagnosis present

## 2017-01-18 DIAGNOSIS — T8484XA Pain due to internal orthopedic prosthetic devices, implants and grafts, initial encounter: Secondary | ICD-10-CM | POA: Diagnosis present

## 2017-01-18 HISTORY — PX: CONVERSION TO TOTAL KNEE: SHX5785

## 2017-01-18 LAB — TYPE AND SCREEN
ABO/RH(D): A POS
Antibody Screen: NEGATIVE

## 2017-01-18 LAB — GLUCOSE, CAPILLARY
GLUCOSE-CAPILLARY: 140 mg/dL — AB (ref 65–99)
Glucose-Capillary: 118 mg/dL — ABNORMAL HIGH (ref 65–99)
Glucose-Capillary: 212 mg/dL — ABNORMAL HIGH (ref 65–99)
Glucose-Capillary: 236 mg/dL — ABNORMAL HIGH (ref 65–99)

## 2017-01-18 SURGERY — CONVERSION, ARTHROPLASTY, KNEE, PARTIAL, TO TOTAL KNEE ARTHROPLASTY
Anesthesia: Spinal | Site: Knee | Laterality: Right

## 2017-01-18 MED ORDER — BUPIVACAINE-EPINEPHRINE (PF) 0.25% -1:200000 IJ SOLN
INTRAMUSCULAR | Status: AC
  Filled 2017-01-18: qty 30

## 2017-01-18 MED ORDER — TRANEXAMIC ACID 1000 MG/10ML IV SOLN
1000.0000 mg | Freq: Once | INTRAVENOUS | Status: AC
  Administered 2017-01-18: 17:00:00 1000 mg via INTRAVENOUS
  Filled 2017-01-18: qty 1100

## 2017-01-18 MED ORDER — BUPIVACAINE IN DEXTROSE 0.75-8.25 % IT SOLN
INTRATHECAL | Status: DC | PRN
  Administered 2017-01-18: 1.6 mL via INTRATHECAL

## 2017-01-18 MED ORDER — CITALOPRAM HYDROBROMIDE 20 MG PO TABS
20.0000 mg | ORAL_TABLET | Freq: Every day | ORAL | Status: DC
  Administered 2017-01-19: 20 mg via ORAL
  Filled 2017-01-18: qty 1

## 2017-01-18 MED ORDER — ONDANSETRON HCL 4 MG/2ML IJ SOLN: 4.0000 mg | Freq: Four times a day (QID) | INTRAMUSCULAR | Status: DC | PRN

## 2017-01-18 MED ORDER — PROPOFOL 10 MG/ML IV BOLUS
INTRAVENOUS | Status: AC
  Filled 2017-01-18: qty 60

## 2017-01-18 MED ORDER — DIPHENHYDRAMINE HCL 25 MG PO CAPS: 25.0000 mg | ORAL_CAPSULE | Freq: Four times a day (QID) | ORAL | Status: DC | PRN

## 2017-01-18 MED ORDER — DEXAMETHASONE SODIUM PHOSPHATE 10 MG/ML IJ SOLN
10.0000 mg | Freq: Once | INTRAMUSCULAR | Status: AC
  Administered 2017-01-18: 10 mg via INTRAVENOUS

## 2017-01-18 MED ORDER — GABAPENTIN 300 MG PO CAPS
600.0000 mg | ORAL_CAPSULE | Freq: Two times a day (BID) | ORAL | Status: DC
  Administered 2017-01-18 – 2017-01-19 (×2): 600 mg via ORAL
  Filled 2017-01-18 (×2): qty 2

## 2017-01-18 MED ORDER — ONDANSETRON HCL 4 MG/2ML IJ SOLN
INTRAMUSCULAR | Status: AC
  Filled 2017-01-18: qty 2

## 2017-01-18 MED ORDER — KETOROLAC TROMETHAMINE 30 MG/ML IJ SOLN
INTRAMUSCULAR | Status: DC | PRN
  Administered 2017-01-18: 30 mg

## 2017-01-18 MED ORDER — AMLODIPINE BESYLATE 5 MG PO TABS
5.0000 mg | ORAL_TABLET | Freq: Every day | ORAL | Status: DC
  Administered 2017-01-19: 10:00:00 5 mg via ORAL
  Filled 2017-01-18: qty 1

## 2017-01-18 MED ORDER — LACTATED RINGERS IV SOLN
INTRAVENOUS | Status: DC
  Administered 2017-01-18: 10:00:00 via INTRAVENOUS

## 2017-01-18 MED ORDER — FENTANYL CITRATE (PF) 100 MCG/2ML IJ SOLN
100.0000 ug | Freq: Once | INTRAMUSCULAR | Status: AC
Start: 2017-01-18 — End: 2017-01-18
  Administered 2017-01-18: 100 ug via INTRAVENOUS

## 2017-01-18 MED ORDER — FENTANYL CITRATE (PF) 100 MCG/2ML IJ SOLN
INTRAMUSCULAR | Status: AC
  Administered 2017-01-18: 100 ug via INTRAVENOUS
  Filled 2017-01-18: qty 2

## 2017-01-18 MED ORDER — MEPERIDINE HCL 50 MG/ML IJ SOLN: 6.2500 mg | INTRAMUSCULAR | Status: DC | PRN

## 2017-01-18 MED ORDER — ONDANSETRON HCL 4 MG/2ML IJ SOLN
INTRAMUSCULAR | Status: DC | PRN
  Administered 2017-01-18: 4 mg via INTRAVENOUS

## 2017-01-18 MED ORDER — MIDAZOLAM HCL 2 MG/2ML IJ SOLN
INTRAMUSCULAR | Status: AC
  Administered 2017-01-18: 2 mg via INTRAVENOUS
  Filled 2017-01-18: qty 2

## 2017-01-18 MED ORDER — PROMETHAZINE HCL 25 MG/ML IJ SOLN: 6.2500 mg | INTRAMUSCULAR | Status: DC | PRN

## 2017-01-18 MED ORDER — CHLORHEXIDINE GLUCONATE 4 % EX LIQD: 60.0000 mL | Freq: Once | CUTANEOUS | Status: DC

## 2017-01-18 MED ORDER — FENTANYL CITRATE (PF) 100 MCG/2ML IJ SOLN
INTRAMUSCULAR | Status: AC
  Filled 2017-01-18: qty 2

## 2017-01-18 MED ORDER — STERILE WATER FOR IRRIGATION IR SOLN
Status: DC | PRN
  Administered 2017-01-18: 2000 mL

## 2017-01-18 MED ORDER — METHOCARBAMOL 1000 MG/10ML IJ SOLN
500.0000 mg | Freq: Four times a day (QID) | INTRAVENOUS | Status: DC | PRN
  Administered 2017-01-18: 500 mg via INTRAVENOUS
  Filled 2017-01-18: qty 550

## 2017-01-18 MED ORDER — INSULIN ASPART 100 UNIT/ML ~~LOC~~ SOLN
0.0000 [IU] | Freq: Three times a day (TID) | SUBCUTANEOUS | Status: DC
  Administered 2017-01-18: 5 [IU] via SUBCUTANEOUS

## 2017-01-18 MED ORDER — ASPIRIN 81 MG PO CHEW: 81.0000 mg | CHEWABLE_TABLET | Freq: Two times a day (BID) | ORAL | 0 refills | Status: AC

## 2017-01-18 MED ORDER — MENTHOL 3 MG MT LOZG: 1.0000 | LOZENGE | OROMUCOSAL | Status: DC | PRN

## 2017-01-18 MED ORDER — PHENOL 1.4 % MT LIQD: 1.0000 | OROMUCOSAL | Status: DC | PRN

## 2017-01-18 MED ORDER — HYDROCODONE-ACETAMINOPHEN 7.5-325 MG PO TABS
1.0000 | ORAL_TABLET | ORAL | Status: DC
  Administered 2017-01-18 (×3): 2 via ORAL
  Administered 2017-01-19: 09:00:00 1 via ORAL
  Administered 2017-01-19 (×2): 2 via ORAL
  Filled 2017-01-18 (×6): qty 2

## 2017-01-18 MED ORDER — PROPOFOL 500 MG/50ML IV EMUL
INTRAVENOUS | Status: DC | PRN
  Administered 2017-01-18: 50 ug/kg/min via INTRAVENOUS

## 2017-01-18 MED ORDER — HYDROCODONE-ACETAMINOPHEN 7.5-325 MG PO TABS: 1.0000 | ORAL_TABLET | ORAL | 0 refills | Status: DC | PRN

## 2017-01-18 MED ORDER — TRANEXAMIC ACID 1000 MG/10ML IV SOLN
1000.0000 mg | INTRAVENOUS | Status: AC
  Administered 2017-01-18: 1000 mg via INTRAVENOUS
  Filled 2017-01-18: qty 1100

## 2017-01-18 MED ORDER — POLYETHYLENE GLYCOL 3350 17 G PO PACK
17.0000 g | PACK | Freq: Two times a day (BID) | ORAL | Status: DC
  Administered 2017-01-18 – 2017-01-19 (×2): 17 g via ORAL
  Filled 2017-01-18 (×2): qty 1

## 2017-01-18 MED ORDER — SODIUM CHLORIDE 0.9 % IJ SOLN
INTRAMUSCULAR | Status: AC
  Filled 2017-01-18: qty 50

## 2017-01-18 MED ORDER — HYDROMORPHONE HCL-NACL 0.5-0.9 MG/ML-% IV SOSY: 0.2500 mg | PREFILLED_SYRINGE | INTRAVENOUS | Status: DC | PRN

## 2017-01-18 MED ORDER — 0.9 % SODIUM CHLORIDE (POUR BTL) OPTIME
TOPICAL | Status: DC | PRN
  Administered 2017-01-18: 1000 mL

## 2017-01-18 MED ORDER — MIDAZOLAM HCL 2 MG/2ML IJ SOLN
2.0000 mg | Freq: Once | INTRAMUSCULAR | Status: AC
  Administered 2017-01-18: 2 mg via INTRAVENOUS

## 2017-01-18 MED ORDER — CEFAZOLIN SODIUM-DEXTROSE 2-4 GM/100ML-% IV SOLN
INTRAVENOUS | Status: AC
  Filled 2017-01-18: qty 100

## 2017-01-18 MED ORDER — INSULIN DEGLUDEC 200 UNIT/ML ~~LOC~~ SOPN: 10.0000 [IU] | PEN_INJECTOR | Freq: Every day | SUBCUTANEOUS | Status: DC

## 2017-01-18 MED ORDER — INSULIN GLARGINE 100 UNIT/ML ~~LOC~~ SOLN
10.0000 [IU] | Freq: Every day | SUBCUTANEOUS | Status: DC
  Administered 2017-01-18: 22:00:00 10 [IU] via SUBCUTANEOUS
  Filled 2017-01-18 (×2): qty 0.1

## 2017-01-18 MED ORDER — METHOCARBAMOL 500 MG PO TABS
500.0000 mg | ORAL_TABLET | Freq: Four times a day (QID) | ORAL | Status: DC | PRN
  Administered 2017-01-19: 03:00:00 500 mg via ORAL
  Filled 2017-01-18: qty 1

## 2017-01-18 MED ORDER — SODIUM CHLORIDE 0.9 % IV SOLN
INTRAVENOUS | Status: DC
  Administered 2017-01-18 – 2017-01-19 (×2): via INTRAVENOUS

## 2017-01-18 MED ORDER — SODIUM CHLORIDE 0.9 % IR SOLN
Status: DC | PRN
  Administered 2017-01-18: 1000 mL

## 2017-01-18 MED ORDER — PHENYLEPHRINE HCL 10 MG/ML IJ SOLN: 30.0000 ug/min | INTRAVENOUS | Status: DC

## 2017-01-18 MED ORDER — DEXAMETHASONE SODIUM PHOSPHATE 10 MG/ML IJ SOLN
10.0000 mg | Freq: Once | INTRAMUSCULAR | Status: AC
Start: 2017-01-19 — End: 2017-01-19
  Administered 2017-01-19: 10 mg via INTRAVENOUS
  Filled 2017-01-18 (×2): qty 1

## 2017-01-18 MED ORDER — DOCUSATE SODIUM 100 MG PO CAPS: 100.0000 mg | ORAL_CAPSULE | Freq: Two times a day (BID) | ORAL | 0 refills | Status: DC

## 2017-01-18 MED ORDER — ASPIRIN 81 MG PO CHEW
81.0000 mg | CHEWABLE_TABLET | Freq: Two times a day (BID) | ORAL | Status: DC
  Administered 2017-01-18 – 2017-01-19 (×2): 81 mg via ORAL
  Filled 2017-01-18 (×2): qty 1

## 2017-01-18 MED ORDER — ALUM & MAG HYDROXIDE-SIMETH 200-200-20 MG/5ML PO SUSP: 15.0000 mL | ORAL | Status: DC | PRN

## 2017-01-18 MED ORDER — ROPIVACAINE HCL 7.5 MG/ML IJ SOLN
INTRAMUSCULAR | Status: DC | PRN
  Administered 2017-01-18: 20 mL via PERINEURAL

## 2017-01-18 MED ORDER — MIDAZOLAM HCL 2 MG/2ML IJ SOLN: 0.5000 mg | Freq: Once | INTRAMUSCULAR | Status: DC | PRN

## 2017-01-18 MED ORDER — BISACODYL 10 MG RE SUPP: 10.0000 mg | Freq: Every day | RECTAL | Status: DC | PRN

## 2017-01-18 MED ORDER — POLYETHYLENE GLYCOL 3350 17 G PO PACK: 17.0000 g | PACK | Freq: Two times a day (BID) | ORAL | 0 refills | Status: DC

## 2017-01-18 MED ORDER — DOCUSATE SODIUM 100 MG PO CAPS
100.0000 mg | ORAL_CAPSULE | Freq: Two times a day (BID) | ORAL | Status: DC
  Administered 2017-01-18 – 2017-01-19 (×2): 100 mg via ORAL
  Filled 2017-01-18 (×2): qty 1

## 2017-01-18 MED ORDER — METOCLOPRAMIDE HCL 5 MG/ML IJ SOLN: 5.0000 mg | Freq: Three times a day (TID) | INTRAMUSCULAR | Status: DC | PRN

## 2017-01-18 MED ORDER — SODIUM CHLORIDE 0.9 % IJ SOLN
INTRAMUSCULAR | Status: DC | PRN
  Administered 2017-01-18: 30 mL

## 2017-01-18 MED ORDER — FERROUS SULFATE 325 (65 FE) MG PO TABS: 325.0000 mg | ORAL_TABLET | Freq: Three times a day (TID) | ORAL | 3 refills | Status: DC

## 2017-01-18 MED ORDER — PHENYLEPHRINE HCL 10 MG/ML IJ SOLN
INTRAVENOUS | Status: DC | PRN
  Administered 2017-01-18: 40 ug/min via INTRAVENOUS

## 2017-01-18 MED ORDER — MAGNESIUM CITRATE PO SOLN: 1.0000 | Freq: Once | ORAL | Status: DC | PRN

## 2017-01-18 MED ORDER — CEFAZOLIN SODIUM-DEXTROSE 2-4 GM/100ML-% IV SOLN
2.0000 g | INTRAVENOUS | Status: AC
  Administered 2017-01-18: 2 g via INTRAVENOUS

## 2017-01-18 MED ORDER — BUPIVACAINE-EPINEPHRINE (PF) 0.25% -1:200000 IJ SOLN
INTRAMUSCULAR | Status: DC | PRN
Start: 2017-01-18 — End: 2017-01-18
  Administered 2017-01-18: 30 mL

## 2017-01-18 MED ORDER — DEXAMETHASONE SODIUM PHOSPHATE 10 MG/ML IJ SOLN
INTRAMUSCULAR | Status: AC
  Filled 2017-01-18: qty 1

## 2017-01-18 MED ORDER — FERROUS SULFATE 325 (65 FE) MG PO TABS
325.0000 mg | ORAL_TABLET | Freq: Three times a day (TID) | ORAL | Status: DC
  Administered 2017-01-18 – 2017-01-19 (×2): 325 mg via ORAL
  Filled 2017-01-18 (×2): qty 1

## 2017-01-18 MED ORDER — METHOCARBAMOL 500 MG PO TABS: 500.0000 mg | ORAL_TABLET | Freq: Four times a day (QID) | ORAL | 0 refills | Status: DC | PRN

## 2017-01-18 MED ORDER — FENTANYL CITRATE (PF) 100 MCG/2ML IJ SOLN
25.0000 ug | INTRAMUSCULAR | Status: DC | PRN
  Filled 2017-01-18: qty 2

## 2017-01-18 MED ORDER — CEFAZOLIN SODIUM-DEXTROSE 2-4 GM/100ML-% IV SOLN
2.0000 g | Freq: Four times a day (QID) | INTRAVENOUS | Status: AC
  Administered 2017-01-18 (×2): 2 g via INTRAVENOUS
  Filled 2017-01-18: qty 100

## 2017-01-18 MED ORDER — METOCLOPRAMIDE HCL 5 MG PO TABS: 5.0000 mg | ORAL_TABLET | Freq: Three times a day (TID) | ORAL | Status: DC | PRN

## 2017-01-18 MED ORDER — KETOROLAC TROMETHAMINE 30 MG/ML IJ SOLN
INTRAMUSCULAR | Status: AC
  Filled 2017-01-18: qty 1

## 2017-01-18 MED ORDER — ONDANSETRON HCL 4 MG PO TABS: 4.0000 mg | ORAL_TABLET | Freq: Four times a day (QID) | ORAL | Status: DC | PRN

## 2017-01-18 MED ORDER — CELECOXIB 200 MG PO CAPS
200.0000 mg | ORAL_CAPSULE | Freq: Two times a day (BID) | ORAL | Status: DC
  Administered 2017-01-18 – 2017-01-19 (×2): 200 mg via ORAL
  Filled 2017-01-18 (×2): qty 1

## 2017-01-18 SURGICAL SUPPLY — 74 items
ADH SKN CLS APL DERMABOND .7 (GAUZE/BANDAGES/DRESSINGS) ×2
BAG SPEC THK2 15X12 ZIP CLS (MISCELLANEOUS) ×2
BAG ZIPLOCK 12X15 (MISCELLANEOUS) ×5 IMPLANT
BANDAGE ACE 6X5 VEL STRL LF (GAUZE/BANDAGES/DRESSINGS) ×4 IMPLANT
BLADE SAW SGTL 11.0X1.19X90.0M (BLADE) IMPLANT
BLADE SAW SGTL 13.0X1.19X90.0M (BLADE) ×7 IMPLANT
BLADE SAW SGTL 18X1.27X75 (BLADE) ×1 IMPLANT
BLADE SAW SGTL 18X1.27X75MM (BLADE)
BONE CEMENT GENTAMICIN (Cement) ×8 IMPLANT
BOWL SMART MIX CTS (DISPOSABLE) ×4 IMPLANT
BRUSH FEMORAL CANAL (MISCELLANEOUS) ×1 IMPLANT
CAP KNEE TOTAL 3 SIGMA ×3 IMPLANT
CEMENT BONE GENTAMICIN 40 (Cement) ×2 IMPLANT
COVER SURGICAL LIGHT HANDLE (MISCELLANEOUS) ×4 IMPLANT
CUFF TOURN SGL QUICK 34 (TOURNIQUET CUFF) ×4
CUFF TRNQT CYL 34X4X40X1 (TOURNIQUET CUFF) ×2 IMPLANT
DECANTER SPIKE VIAL GLASS SM (MISCELLANEOUS) ×4 IMPLANT
DERMABOND ADVANCED (GAUZE/BANDAGES/DRESSINGS) ×2
DERMABOND ADVANCED .7 DNX12 (GAUZE/BANDAGES/DRESSINGS) ×2 IMPLANT
DRAPE ORTHO SPLIT 77X108 STRL (DRAPES)
DRAPE POUCH INSTRU U-SHP 10X18 (DRAPES) ×1 IMPLANT
DRAPE SURG 17X11 SM STRL (DRAPES) ×1 IMPLANT
DRAPE SURG ORHT 6 SPLT 77X108 (DRAPES) ×2 IMPLANT
DRAPE U-SHAPE 47X51 STRL (DRAPES) ×5 IMPLANT
DRESSING AQUACEL AG SP 3.5X10 (GAUZE/BANDAGES/DRESSINGS) ×2 IMPLANT
DRSG AQUACEL AG ADV 3.5X10 (GAUZE/BANDAGES/DRESSINGS) ×4 IMPLANT
DRSG AQUACEL AG SP 3.5X10 (GAUZE/BANDAGES/DRESSINGS) ×8
DRSG EMULSION OIL 3X16 NADH (GAUZE/BANDAGES/DRESSINGS) ×1 IMPLANT
DRSG MEPILEX BORDER 4X4 (GAUZE/BANDAGES/DRESSINGS) ×1 IMPLANT
DRSG MEPILEX BORDER 4X8 (GAUZE/BANDAGES/DRESSINGS) ×1 IMPLANT
DRSG TEGADERM 4X4.75 (GAUZE/BANDAGES/DRESSINGS) ×1 IMPLANT
DURAPREP 26ML APPLICATOR (WOUND CARE) ×9 IMPLANT
ELECT BLADE TIP CTD 4 INCH (ELECTRODE) ×1 IMPLANT
ELECT REM PT RETURN 15FT ADLT (MISCELLANEOUS) ×5 IMPLANT
FACESHIELD WRAPAROUND (MASK) IMPLANT
FACESHIELD WRAPAROUND OR TEAM (MASK) ×4 IMPLANT
GLOVE BIOGEL M 7.0 STRL (GLOVE) IMPLANT
GLOVE BIOGEL PI IND STRL 7.5 (GLOVE) ×3 IMPLANT
GLOVE BIOGEL PI IND STRL 8.5 (GLOVE) ×2 IMPLANT
GLOVE BIOGEL PI INDICATOR 7.5 (GLOVE) ×2
GLOVE BIOGEL PI INDICATOR 8.5 (GLOVE)
GLOVE ECLIPSE 8.0 STRL XLNG CF (GLOVE) ×1 IMPLANT
GLOVE ORTHO TXT STRL SZ7.5 (GLOVE) ×10 IMPLANT
GLOVE SURG ORTHO 8.0 STRL STRW (GLOVE) ×1 IMPLANT
GOWN STRL REUS W/TWL LRG LVL3 (GOWN DISPOSABLE) ×5 IMPLANT
GOWN STRL REUS W/TWL XL LVL3 (GOWN DISPOSABLE) ×4 IMPLANT
HANDPIECE INTERPULSE COAX TIP (DISPOSABLE) ×4
MANIFOLD NEPTUNE II (INSTRUMENTS) ×5 IMPLANT
NS IRRIG 1000ML POUR BTL (IV SOLUTION) ×7 IMPLANT
PACK TOTAL KNEE CUSTOM (KITS) ×4 IMPLANT
PADDING CAST COTTON 6X4 STRL (CAST SUPPLIES) ×1 IMPLANT
POSITIONER SURGICAL ARM (MISCELLANEOUS) ×5 IMPLANT
PRESSURIZER FEMORAL UNIV (MISCELLANEOUS) ×1 IMPLANT
SET HNDPC FAN SPRY TIP SCT (DISPOSABLE) ×3 IMPLANT
SET PAD KNEE POSITIONER (MISCELLANEOUS) ×4 IMPLANT
SPONGE LAP 18X18 X RAY DECT (DISPOSABLE) ×1 IMPLANT
SPONGE LAP 4X18 X RAY DECT (DISPOSABLE) ×1 IMPLANT
STAPLER VISISTAT 35W (STAPLE) ×1 IMPLANT
SUCTION FRAZIER HANDLE 10FR (MISCELLANEOUS)
SUCTION TUBE FRAZIER 10FR DISP (MISCELLANEOUS) ×1 IMPLANT
SUT MNCRL AB 3-0 PS2 18 (SUTURE) ×3 IMPLANT
SUT MNCRL AB 4-0 PS2 18 (SUTURE) ×1 IMPLANT
SUT VIC AB 1 CT1 36 (SUTURE) ×7 IMPLANT
SUT VIC AB 2-0 CT1 27 (SUTURE) ×12
SUT VIC AB 2-0 CT1 TAPERPNT 27 (SUTURE) ×9 IMPLANT
SUT VLOC 180 0 24IN GS25 (SUTURE) ×6 IMPLANT
TOWEL OR 17X26 10 PK STRL BLUE (TOWEL DISPOSABLE) ×2 IMPLANT
TOWER CARTRIDGE SMART MIX (DISPOSABLE) ×1 IMPLANT
TRAY FOLEY CATH 14FRSI W/METER (CATHETERS) ×3 IMPLANT
TRAY FOLEY W/METER SILVER 16FR (SET/KITS/TRAYS/PACK) ×2 IMPLANT
TRAY TIB SZ 2 REVISION (Knees) ×3 IMPLANT
WATER STERILE IRR 1500ML POUR (IV SOLUTION) ×5 IMPLANT
WRAP KNEE MAXI GEL POST OP (GAUZE/BANDAGES/DRESSINGS) ×4 IMPLANT
YANKAUER SUCT BULB TIP 10FT TU (MISCELLANEOUS) ×3 IMPLANT

## 2017-01-18 NOTE — Anesthesia Preprocedure Evaluation (Addendum)
Anesthesia Evaluation  Patient identified by MRN, date of birth, ID band Patient awake    Reviewed: Allergy & Precautions, NPO status , Patient's Chart, lab work & pertinent test results  History of Anesthesia Complications Negative for: history of anesthetic complications  Airway Mallampati: II  TM Distance: >3 FB Neck ROM: Full    Dental  (+) Dental Advisory Given   Pulmonary sleep apnea and Continuous Positive Airway Pressure Ventilation , COPD, Current Smoker,    breath sounds clear to auscultation       Cardiovascular hypertension, Pt. on medications (-) angina Rhythm:Regular Rate:Normal     Neuro/Psych Depression negative neurological ROS     GI/Hepatic negative GI ROS, Neg liver ROS,   Endo/Other  diabetes (glu 118), Insulin DependentMorbid obesity  Renal/GU negative Renal ROS     Musculoskeletal  (+) Arthritis , Osteoarthritis,    Abdominal (+) + obese,   Peds  Hematology   Anesthesia Other Findings   Reproductive/Obstetrics                            Anesthesia Physical Anesthesia Plan  ASA: III  Anesthesia Plan: Spinal   Post-op Pain Management:  Regional for Post-op pain   Induction:   PONV Risk Score and Plan: 2 and Ondansetron and Dexamethasone  Airway Management Planned: Natural Airway and Simple Face Mask  Additional Equipment:   Intra-op Plan:   Post-operative Plan:   Informed Consent: I have reviewed the patients History and Physical, chart, labs and discussed the procedure including the risks, benefits and alternatives for the proposed anesthesia with the patient or authorized representative who has indicated his/her understanding and acceptance.   Dental advisory given  Plan Discussed with: CRNA and Surgeon  Anesthesia Plan Comments: (Plan routine monitors, SAB with adductor canal block for post op analgesia  Sandford Craze, MD)        Anesthesia Quick  Evaluation

## 2017-01-18 NOTE — Anesthesia Procedure Notes (Signed)
Anesthesia Regional Block: Adductor canal block   Pre-Anesthetic Checklist: ,, timeout performed, Correct Patient, Correct Site, Correct Laterality, Correct Procedure, Correct Position, site marked, Risks and benefits discussed,  Surgical consent,  Pre-op evaluation,  At surgeon's request and post-op pain management  Laterality: Right and Lower  Prep: chloraprep       Needles:   Needle Type: Echogenic Needle     Needle Length: 9cm  Needle Gauge: 21     Additional Needles:   Procedures: ultrasound guided,,,,,,,,  Narrative:  Start time: 01/18/2017 10:30 AM End time: 01/18/2017 10:38 AM Injection made incrementally with aspirations every 5 mL.  Performed by: Personally  Anesthesiologist: Jean Rosenthal, Ardythe Klute  Additional Notes: Pt identified in Holding room.  Monitors applied. Working IV access confirmed. Sterile prep, drape R thigh.  #21 ga ECHOgenic needle into adductor canal with US guidance.  20cc 0.5% Ropivacaine injected incrementally after negative test dose.  Patient asymptomatic, VSS, no heme aspirated, tolerated well.  Sandford Craze, MD

## 2017-01-18 NOTE — Progress Notes (Signed)
AssistedDr. Carswell Jackson with right, ultrasound guided, adductor canal block. Side rails up, monitors on throughout procedure. See vital signs in flow sheet. Tolerated Procedure well.  

## 2017-01-18 NOTE — Anesthesia Procedure Notes (Addendum)
Spinal  Patient location during procedure: OR Start time: 01/18/2017 11:10 AM End time: 01/18/2017 11:14 AM Reason for block: at surgeon's request Staffing Resident/CRNA: Anne Fu Performed: resident/CRNA  Preanesthetic Checklist Completed: patient identified, site marked, surgical consent, pre-op evaluation, timeout performed, IV checked, risks and benefits discussed and monitors and equipment checked Spinal Block Patient position: sitting Prep: DuraPrep Patient monitoring: heart rate, continuous pulse ox and blood pressure Approach: midline Location: L2-3 Injection technique: single-shot Needle Needle type: Pencan  Needle gauge: 24 G Needle length: 9 cm Assessment Sensory level: T6 Additional Notes Expiration date of kit checked and confirmed. Patient tolerated procedure well, without complications. X 1 attempt with noted clear CSF return. Loss of motor and sensory on exam post injection.

## 2017-01-18 NOTE — Interval H&P Note (Signed)
History and Physical Interval Note:  01/18/2017 10:04 AM  Kelli Ruiz  has presented today for surgery, with the diagnosis of Failed right uni compartmental arthroplasty  The various methods of treatment have been discussed with the patient and family. After consideration of risks, benefits and other options for treatment, the patient has consented to  Procedure(s) with comments: 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 (Right) - 90 mins as a surgical intervention .  The patient's history has been reviewed, patient examined, no change in status, stable for surgery.  I have reviewed the patient's chart and labs.  Questions were answered to the patient's satisfaction.     [name]

## 2017-01-18 NOTE — Discharge Instructions (Signed)

## 2017-01-18 NOTE — Anesthesia Postprocedure Evaluation (Signed)
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)     Patient location during evaluation: PACU Anesthesia Type: Spinal Level of consciousness: awake and alert, patient cooperative and oriented Pain management: pain level controlled Vital Signs Assessment: post-procedure vital signs reviewed and stable Respiratory status: spontaneous breathing, nonlabored ventilation, patient connected to nasal cannula oxygen and respiratory function stable Cardiovascular status: stable and blood pressure returned to baseline Postop Assessment: patient able to bend at knees and no signs of nausea or vomiting Anesthetic complications: no    Last Vitals:  Vitals:   01/18/17 1430 01/18/17 1449  BP: (!) 108/55 (!) 109/56  Pulse:  75  Resp:  14  Temp: (!) 36.4 C 36.5 C  SpO2:  92%    Last Pain:  Vitals:   01/18/17 1326  TempSrc:   PainSc: 0-No pain                 Dannielle Baskins,E. Tosh Glaze

## 2017-01-18 NOTE — Transfer of Care (Signed)
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

## 2017-01-19 DIAGNOSIS — E669 Obesity, unspecified: Secondary | ICD-10-CM

## 2017-01-19 HISTORY — DX: Obesity, unspecified: E66.9

## 2017-01-19 LAB — CBC
HEMATOCRIT: 38 % (ref 36.0–46.0)
HEMOGLOBIN: 12.7 g/dL (ref 12.0–15.0)
MCH: 29.9 pg (ref 26.0–34.0)
MCHC: 33.4 g/dL (ref 30.0–36.0)
MCV: 89.4 fL (ref 78.0–100.0)
Platelets: 189 10*3/uL (ref 150–400)
RBC: 4.25 MIL/uL (ref 3.87–5.11)
RDW: 13.4 % (ref 11.5–15.5)
WBC: 14.6 10*3/uL — ABNORMAL HIGH (ref 4.0–10.5)

## 2017-01-19 LAB — BASIC METABOLIC PANEL
Anion gap: 6 (ref 5–15)
BUN: 10 mg/dL (ref 6–20)
CALCIUM: 8.4 mg/dL — AB (ref 8.9–10.3)
CO2: 26 mmol/L (ref 22–32)
CREATININE: 0.73 mg/dL (ref 0.44–1.00)
Chloride: 102 mmol/L (ref 101–111)
GFR calc non Af Amer: >60 mL/min (ref >60)
GLUCOSE: 219 mg/dL — AB (ref 65–99)
Potassium: 4 mmol/L (ref 3.5–5.1)
Sodium: 134 mmol/L — ABNORMAL LOW (ref 135–145)

## 2017-01-19 LAB — GLUCOSE, CAPILLARY
GLUCOSE-CAPILLARY: 112 mg/dL — AB (ref 65–99)
Glucose-Capillary: 181 mg/dL — ABNORMAL HIGH (ref 65–99)
Glucose-Capillary: 140 mg/dL — ABNORMAL HIGH (ref 65–99)

## 2017-01-19 NOTE — Care Management Note (Signed)
Case Management Note  Patient Details  Name: Kelli Ruiz MRN: [id number] Date of Birth: [birth year]  Subjective/Objective: 61 y/o f admitted w/R knee conversion. From home. Await PT/OT recc. Contacted workers comp adjuster-Ryan Fee-tel#[phone], fax#[phone]-once orders received for dme will fax-they will deliver dme to rm prior d/c using a service called(one call)                   Action/Plan:d/c plan home.   Expected Discharge Date:  01/19/17               Expected Discharge Plan:  Home/Self Care  In-House Referral:     Discharge planning Services  CM Consult  Post Acute Care Choice:    Choice offered to:     DME Arranged:    DME Agency:     HH Arranged:    HH Agency:     Status of Service:  In process, will continue to follow  If discussed at Long Length of Stay Meetings, dates discussed:    Additional Comments:  Lanier Clam, RN 01/19/2017, 9:51 AM

## 2017-01-19 NOTE — Brief Op Note (Signed)
01/18/2017  1:14 PM  PATIENT:  Kelli Ruiz  61 y.o. 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) - 90 mins with regional block  SURGEON:  Surgeon(s) and Role:    Durene Romans, MD - Primary  PHYSICIAN ASSISTANT: Skip Mayer, PA-C  ANESTHESIA:   regional and spinal  EBL:  Total I/O In: 240 [P.O.:240] Out: -   BLOOD ADMINISTERED:none  DRAINS: none   LOCAL MEDICATIONS USED:  MARCAINE     SPECIMEN:  No Specimen  DISPOSITION OF SPECIMEN:  N/A  COUNTS:  YES  TOURNIQUET:   Total Tourniquet Time Documented: Thigh (Right) - 36 minutes Total: Thigh (Right) - 36 minutes   DICTATION: .Other Dictation: Dictation Number [id number]  PLAN OF CARE: Admit to inpatient   PATIENT DISPOSITION:  PACU - hemodynamically stable.   Delay start of Pharmacological VTE agent (>24hrs) due to surgical blood loss or risk of bleeding: no

## 2017-01-19 NOTE — Evaluation (Signed)
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    Clinical Impression   This 61 y/o F presents with the above. At baseline Pt is independent with ADLs and functional mobility. Pt currently requires MinGuard for functional mobility using RW, MinA for standing bil UE tasks, and ModA for LB ADLs. Pt will have 24 hour friend/family assist after return home to assist with ADL completion PRN. Education provided and questions answered throughout session. Will continue to follow acutely to maximize Pt's safety and independence with ADLs and functional mobility prior to return home.     Follow Up Recommendations  No OT follow up;Supervision/Assistance - 24 hour    Equipment Recommendations  3 in 1 bedside commode           Precautions / Restrictions Precautions Precautions: Fall;Knee Precaution Comments: verbally reviewed knee precautions  Restrictions Weight Bearing Restrictions: No RLE Weight Bearing: Weight bearing as tolerated      Mobility Bed Mobility Overal bed mobility: Needs Assistance Bed Mobility: Supine to Sit     Supine to sit: HOB elevated;Min guard     General bed mobility comments: close guard for safety, use of handrails   Transfers Overall transfer level: Needs assistance Equipment used: Rolling walker (2 wheeled) Transfers: Sit to/from Stand Sit to Stand: Min assist         General transfer comment: assist to rise and steady, verbal cues for hand/LE placement     Balance Overall balance assessment: Needs assistance Sitting-balance support: Feet supported;No upper extremity supported Sitting balance-Leahy Scale: Good     Standing balance support: Bilateral upper extremity supported;During functional activity Standing balance-Leahy Scale:  Poor Standing balance comment: reliant on UE support during mobility, Min steady assist from therapist while Pt washes hands standing at sink                            ADL either performed or assessed with clinical judgement   ADL Overall ADL's : Needs assistance/impaired Eating/Feeding: Set up;Sitting   Grooming: Wash/dry hands;Minimal assistance;Standing   Upper Body Bathing: Min guard;Sitting   Lower Body Bathing: Minimal assistance;Sit to/from stand   Upper Body Dressing : Min guard;Sitting   Lower Body Dressing: Moderate assistance;Sit to/from stand   Toilet Transfer: Min guard;Ambulation;BSC;RW Toilet Transfer Details (indicate cue type and reason): BSC over toilet  Toileting- Clothing Manipulation and Hygiene: Min guard;Sit to/from Nurse, children's Details (indicate cue type and reason): educated on safety during tub transfer and use of 3:1 as shower chair; discussed option of sponge bathing initially until Pt feels she is able to safely step into tub Functional mobility during ADLs: Min guard;Rolling walker General ADL Comments: educated on compensatory techniques for completing ADLs                         Pertinent Vitals/Pain Pain Assessment: 0-10 Pain Score: 4  Pain Location: R LE  Pain Descriptors / Indicators: Aching;Sore Pain Intervention(s): Limited activity within patient's tolerance;Monitored during session;Premedicated before session;Ice applied          Extremity/Trunk Assessment Upper Extremity Assessment Upper Extremity Assessment: Overall WFL for tasks assessed   Lower Extremity Assessment Lower Extremity Assessment: Defer to PT evaluation       Communication  Communication Communication: No difficulties   Cognition Arousal/Alertness: Awake/alert Behavior During Therapy: WFL for tasks assessed/performed Overall Cognitive Status: Within Functional Limits for tasks assessed                                                       Home Living Family/patient expects to be discharged to:: Private residence Living Arrangements: Other relatives Available Help at Discharge: Family;Friend(s);Available 24 hours/day Type of Home: House             Bathroom Shower/Tub: Chief Strategy Officer: Standard     Home Equipment: None          Prior Functioning/Environment Level of Independence: Independent        Comments: reports occasionally needing assistance for donning socks/shoes         OT Problem List: Impaired balance (sitting and/or standing);Decreased strength;Decreased knowledge of use of DME or AE;Decreased activity tolerance      OT Treatment/Interventions: Self-care/ADL training;DME and/or AE instruction;Therapeutic activities;Balance training;Therapeutic exercise;Patient/family education    OT Goals(Current goals can be found in the care plan section) Acute Rehab OT Goals Patient Stated Goal: return home  OT Goal Formulation: With patient Time For Goal Achievement: 02/02/17 Potential to Achieve Goals: Good  OT Frequency: Min 2X/week                             AM-PAC PT "6 Clicks" Daily Activity     Outcome Measure Help from another person eating meals?: None Help from another person taking care of personal grooming?: A Little Help from another person toileting, which includes using toliet, bedpan, or urinal?: A Little Help from another person bathing (including washing, rinsing, drying)?: A Lot Help from another person to put on and taking off regular upper body clothing?: A Little Help from another person to put on and taking off regular lower body clothing?: A Lot 6 Click Score: 17   End of Session Equipment Utilized During Treatment: Gait belt;Rolling walker Nurse Communication: Mobility status  Activity Tolerance: Patient tolerated treatment well Patient left: in chair;with call bell/phone within reach;with chair alarm  set  OT Visit Diagnosis: Unsteadiness on feet (R26.81)                Time: 5883-2549 OT Time Calculation (min): 33 min Charges:  OT General Charges $OT Visit: 1 Visit OT Evaluation $OT Eval Low Complexity: 1 Low OT Treatments $Self Care/Home Management : 8-22 mins G-Codes:     Kelli Ruiz, OT Pager [phone] 01/19/2017   Kelli Ruiz 01/19/2017, 10:37 AM

## 2017-01-19 NOTE — Progress Notes (Signed)
     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  No events throughout the night.  Ready to be discharged home.   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 Follow up with OLIN,Hiba Garry D in 2 weeks.  Contact information:  [facility] [address], [address] [place] [address] [phone]    Obese (BMI 30-39.9) Estimated body mass index is 31.41 kg/m as calculated from the following:   Height as of this encounter: 5\' 4"  (1.626 m).   Weight as of this encounter: 83 kg (183 lb). Patient also counseled that weight may inhibit the healing process Patient counseled that losing weight will help with future health issues      . Janae Bonser   PAC  01/19/2017, 8:44 AM

## 2017-01-19 NOTE — Addendum Note (Signed)
Addendum  created 01/19/17 0618 by Elyn Peers, CRNA   Charge Capture section accepted

## 2017-01-19 NOTE — Care Management Note (Signed)
Case Management Note  Patient Details  Name: Kelli Ruiz MRN: [id number] Date of Birth: 17-Apr-1956  Subjective/Objective:  Faxed w/confirmation-dme orders-3n1,rw order-await delivery of dme to rm prior d/c. Patient to receive otpt PT/no OT needed. No further CM needs.                  Action/Plan:d/c home w/dme.   Expected Discharge Date:  01/19/17               Expected Discharge Plan:  OP Rehab  In-House Referral:     Discharge planning Services  CM Consult  Post Acute Care Choice:    Choice offered to:     DME Arranged:  3-N-1, Walker rolling DME Agency:  Other - Comment (Workers comp)  HH Arranged:    HH Agency:     Status of Service:  Completed, signed off  If discussed at [employer], dates discussed:    Additional Comments:  Lanier Clam, RN 01/19/2017, 11:36 AM

## 2017-01-19 NOTE — Evaluation (Signed)
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   Clinical Impression  Patient evaluated by Physical Therapy with no further acute PT needs identified. All education has been completed and the patient has no further questions.  Pt is mobilizing well with RW and practiced safe stair technique.  Pt eager to return home today.  Verbally discussed HEP and provided handout.  Pt plans to follow up with outpatient physical therapy.  PT is signing off. Thank you for this referral.     Follow Up Recommendations DC plan and follow up therapy as arranged by surgeon;Outpatient PT    Equipment Recommendations  Rolling walker with 5" wheels    Recommendations for Other Services       Precautions / Restrictions Precautions Precautions: Fall;Knee Precaution Comments: verbally reviewed knee precautions  Restrictions Weight Bearing Restrictions: No RLE Weight Bearing: Weight bearing as tolerated      Mobility  Bed Mobility Overal bed mobility: Needs Assistance Bed Mobility: Supine to Sit     Supine to sit: HOB elevated;Min guard     General bed mobility comments: pt up in recliner on arrival  Transfers Overall transfer level: Needs assistance Equipment used: Rolling walker (2 wheeled) Transfers: Sit to/from Stand Sit to Stand: Min guard         General transfer comment: able to recall correct UE and LE positioning from working with OT, min/guard for safety  Ambulation/Gait Ambulation/Gait assistance: Min guard Ambulation Distance (Feet): 120 Feet Assistive device: Rolling walker (2 wheeled) Gait Pattern/deviations: Step-through pattern;Decreased stance time - right;Antalgic     General Gait Details: verbal cues for RW positioning and heel strike  Stairs Stairs:  Yes Stairs assistance: Min guard Stair Management: Step to pattern;Forwards;Two rails Number of Stairs: 3 General stair comments: verbal cues for sequence and safety, performed total of 5 steps twice  Wheelchair Mobility    Modified Rankin (Stroke Patients Only)       Balance Overall balance assessment: Needs assistance Sitting-balance support: Feet supported;No upper extremity supported Sitting balance-Leahy Scale: Good     Standing balance support: Bilateral upper extremity supported;During functional activity Standing balance-Leahy Scale: Poor Standing balance comment: reliant on UE support during mobility, Min steady assist from therapist while Pt washes hands standing at sink                              Pertinent Vitals/Pain Pain Assessment: 0-10 Pain Score: 5  Pain Location: R knee Pain Descriptors / Indicators: Aching;Sore Pain Intervention(s): Limited activity within patient's tolerance;Monitored during session;Repositioned;Ice applied    Home Living Family/patient expects to be discharged to:: Private residence Living Arrangements: Other relatives Available Help at Discharge: Family;Friend(s);Available 24 hours/day Type of Home: House Home Access: Stairs to enter Entrance Stairs-Rails: Right;Left;Can reach both Entrance Stairs-Number of Steps: 3 Home Layout: One level Home Equipment: None      Prior Function Level of Independence: Independent         Comments: reports occasionally needing assistance for donning socks/shoes      Hand Dominance        Extremity/Trunk Assessment   Upper Extremity Assessment Upper Extremity Assessment: Overall WFL for tasks assessed    Lower Extremity Assessment Lower Extremity Assessment: RLE deficits/detail RLE Deficits / Details: unable to perform SLR,  good quad contraction       Communication   Communication: No difficulties  Cognition Arousal/Alertness: Awake/alert Behavior During Therapy:  WFL for tasks assessed/performed Overall Cognitive Status: Within Functional Limits for tasks assessed                                        General Comments      Exercises     Assessment/Plan    PT Assessment All further PT needs can be met in the next venue of care  PT Problem List Decreased strength;Decreased mobility;Decreased range of motion;Pain       PT Treatment Interventions      PT Goals (Current goals can be found in the Care Plan section)  Acute Rehab PT Goals Patient Stated Goal: return home  PT Goal Formulation: All assessment and education complete, DC therapy    Frequency     Barriers to discharge        Co-evaluation               AM-PAC PT "6 Clicks" Daily Activity  Outcome Measure Difficulty turning over in bed (including adjusting bedclothes, sheets and blankets)?: A Little Difficulty moving from lying on back to sitting on the side of the bed? : A Little Difficulty sitting down on and standing up from a chair with arms (e.g., wheelchair, bedside commode, etc,.)?: A Little Help needed moving to and from a bed to chair (including a wheelchair)?: A Little Help needed walking in hospital room?: A Little Help needed climbing 3-5 steps with a railing? : A Little 6 Click Score: 18    End of Session   Activity Tolerance: Patient tolerated treatment well Patient left: in chair;with call bell/phone within reach;with family/visitor present   PT Visit Diagnosis: Other abnormalities of gait and mobility (R26.89)    Time: 2703-5009 PT Time Calculation (min) (ACUTE ONLY): 29 min   Charges:   PT Evaluation $PT Eval Low Complexity: 1 Low PT Treatments $Gait Training: 8-22 mins   PT G Codes:        Carmelia Bake, PT, DPT 01/19/2017 Pager: [phone]  York Ram E 01/19/2017, 12:35 PM

## 2017-01-20 NOTE — Op Note (Signed)
NAMEISHIA, TENORIO                ACCOUNT NO.:  [id number]  MEDICAL RECORD NO.:  [id number]  LOCATION:                                 FACILITY:  PHYSICIAN:  Madlyn Frankel. Charlann Boxer, M.D.       DATE OF BIRTH:  DATE OF PROCEDURE: DATE OF DISCHARGE:                              OPERATIVE REPORT   PREOPERATIVE DIAGNOSIS:  Failed right partial knee arthroplasty, medial.  POSTOPERATIVE DIAGNOSIS:  Failed right partial knee arthroplasty, medial.  PROCEDURE:  Conversion of failed right partial knee arthroplasty to a total knee arthroplasty.  COMPONENTS USED:  DePuy Sigma knee system with a size 2 posterior stabilized femur, size 2 MBT revision tray, 10 mm PS insert and a 32 patellar button.  SURGEON:  Madlyn Frankel. Charlann Boxer, M.D.  ASSISTANT:  Skip Mayer, PA-C.  ANESTHESIA:  Regional plus spinal.  COMPLICATIONS:  None.  DRAINS:  None.  TOURNIQUET TIME:  About 35 minutes at 250 mmHg.  BLOOD LOSS:  Less than 100 mL.  INDICATION FOR PROCEDURE:  Ms. Vonbehren is a 61 year old female, who has been seen and evaluated through Worker's Comp, partial knee replacement was performed.  She was initially seen early in the period. Subsequently, bone scan was not valid or useful to evaluate for loosening.  She was felt to have medial based pain, for which, I did diagnostic workup and felt that she had post operative saphenous nerve entrapment. This was excised with some improvement; however, she had persistent chronic problems with inability to return back to work duties. Subsequently, she was seen with persistent pain and felt that she was now far enough out that a bone scan would be a more valid study at this point and was ordered.  This did indicate concerns for loosening, and a workup for infection was negative.  Given this persistence of her pain, the bone scan results, time-out for index surgery, revision surgery discussed temporizing her total knee arthroplasty and the benefits of removing the  partial knee arthroplasty. I felt that we have given her partial knee adequate time to heal with persistent pain and make sense.  There was some concerns about posterolateral overhang of the tibial component, which also could contribute the pain.  We reviewed the risks of infection, DVT, component failure, need for future surgeries, stressed the recovery after total knee arthroplasty, persistent pain despite this revision surgery. Consent was obtained for benefit of pain relief.  PROCEDURE IN DETAIL:  The patient was brought to the operative theater. Once adequate anesthesia, preoperative antibiotics, Ancef administered as well as 1 g of tranexamic acid and Decadron 10 mg, she was positioned supine with the right thigh tourniquet placed.  The right lower extremity was prepped and draped in usual sterile fashion.  Time-out was performed identifying the patient, planned procedure and extremity.  Leg was exsanguinated.  Tourniquet was elevated to 250 mmHg.  The midline old incision was identified and extended proximally and slightly distally for its exposure.  Soft tissue dissection was carried down. The median arthrotomy was made.  With the knee exposed and soft tissue, initial synovectomy performed.  Attention was first directed to the patella.  Precut measurement was noted to  be about 23 mm.  I resected down to 14 mm, used a 32 button to cover her patella.  Lug holes were drilled and a metal shim placed.  At this point, we attended to the femur.  Femoral canal was opened with a drill.  The distal cut was set at 3 degrees for 10 mm bone resection and resected the bone off the lateral distal femur and then partially of the distal aspect of the medial femur.  We then used an osteotome to remove the femoral component, which came off without significant bone loss or significant challenge.  I then finished up the cut on the medial side of the femur.  Following this resection, I attended  to the tibia.  An extramedullary guide was used to inset the 10-mm bone resection off the lateral proximal tibia.  This ended up being right at the level of her tibial component.  Thus, no wedges were needed.  I started the resection laterally, then medially, and then using osteotome and removed the tibial component again, which were removed without difficulty or bone loss.  Once the tibial component was removed, I finished off the cut to make certain I was satisfied with it.  Confirmed that it was perpendicular in the coronal plane with 2 degrees of slope.  I then sized the tibia to be a size 2.  The size 2 tibial tray.  We checked the extension gap to make certain it was really stable release at 10-mm insert and it was.  Once this was done, I returned back to tension the femur.  The femur was sized to be size 2.  The size 2 block was positioned using the rotation block.  The anterior, posterior and chamfer cuts were then made followed by the box cut.  Once this was done, the tibia was subluxated anterior and sized to be a size 2.  The size 2 was pinned into position.  The tibia was then drilled and keel punched for MBT revision tray.  The keeled component was then punched into the tibia.  At this point, did a trial reduction with the 2 femur, 2 MBT revision tray and the 10 mm PS insert. The knee came into full extension with good flexion.  There was slight high-flexion, contractures, ultimately went back to a 10-mm insert and used a 32 patellar button, which tracked through the trochlea without application of pressure.  Given these findings, all the final components were opened on the table.  The knee was irrigated with normal saline solution.  The synovial capsular junction was injected with 30 mL of 0.25% Marcaine with epinephrine, 1 mL of Toradol and 30 mL of normal saline.  The final components were then cemented into place. The knee was brought to extension with a 10-mm  insert.  Once the cement had cured, excessive cement was removed throughout the knee.  The final 10-mm insert was selected and placed in the knee.  The knee was irrigated after the tourniquet was let down.  No significant hemostasis required.  Extensor mechanism was reapproximated using a combination of #1 Vicryl and 0 Stratafix suture.  The remainder of the wound was closed with 2-0 Vicryl and running 3-0 Monocryl.  The knee was cleaned, dried and dressed sterilely using surgical glue and an Aquacel dressing.  She was then brought to the recovery room in stable condition, tolerating the procedure well.  Findings were reviewed with the family.     Madlyn Frankel Charlann Boxer, M.D.  MDO/MEDQ  D:  01/19/2017  T:  01/20/2017  Job:  [id number]

## 2017-01-21 NOTE — Discharge Summary (Signed)
Physician Discharge Summary  Patient ID: Kelli Ruiz MRN: [id number] DOB/AGE: 1955-11-19 61 y.o.  Admit date: 01/18/2017 Discharge date: 01/19/2017   Procedures:  Procedure(s) (LRB): 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 (Right)  Attending Physician:  Dr. Durene Romans   Admission Diagnoses:   Right knee pain s/p UKR  Discharge Diagnoses:  Principal Problem:   S/P conversion right knee Active Problems:   Obese  Past Medical History:  Diagnosis Date  . Arthritis   . Complication of anesthesia    " they had a little bit of a hard time waking me after they took a nerve out of my leg"   . Depression   . Diabetes mellitus without complication (HCC)   . Hypertension   . RLS (restless legs syndrome)   . Sleep apnea     HPI:    Kelli Ruiz, 61 y.o. 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 The indications for the revision of the total knee arthroplasty are bearing surface wear leading to symptomatic synovitis. Onset of symptoms was gradual starting 3+ years ago with gradually worsening course since that time.  Prior procedures on the right knee(s) include unicompartmental arthroplasty in [place] in 2015. Patient currently rates pain in the right knee(s) at 8 out of 10 with activity. There is night pain, worsening of pain with activity and weight bearing, pain that interferes with activities of daily living, pain with passive range of motion and joint swelling.  Patient has evidence of previous UKR by imaging studies. This condition presents safety issues increasing the risk of falls.  There is no current active infection.   Risks, benefits and expectations were discussed with  the patient.  Risks including but not limited to the risk of anesthesia, blood clots, nerve damage, blood vessel damage, failure of the prosthesis, infection and up to and including death.  Patient understand the risks, benefits and expectations and wishes to proceed with surgery.   PCP: Brayton Layman, MD   Discharged Condition: good  Hospital Course:  Patient underwent the above stated procedure on 01/18/2017. Patient tolerated the procedure well and brought to the recovery room in good condition and subsequently to the floor.  POD #1 BP: 123/74 ; Pulse: 84 ; Temp: 98.4 F (36.9 C) ; Resp: 18 Patient reports pain as mild, pain controlled.  No events throughout the night.  Ready to be discharged home.  Dorsiflexion/plantar flexion intact, incision: dressing C/D/I, no cellulitis present and compartment soft.   LABS  Basename    HGB     12.7  HCT     38.0    Discharge Exam: General appearance: alert, cooperative and no distress Extremities: Homans sign is negative, no sign of DVT, no edema, redness or tenderness in the calves or thighs and no ulcers, gangrene or trophic changes  Disposition: Home with follow up in 2 weeks   Follow-up Information    Durene Romans, MD. Schedule an appointment as soon as possible for a visit in 2 week(s).   Specialty:  Orthopedic Surgery Contact information: [address] [place] [address] [phone]        [facility] Follow up.   Contact information: dme company per worker's comp [employer] comp adjuster tel#[phone]  Discharge Instructions    Call MD / Call 911    Complete by:  As directed    If you experience chest pain or shortness of breath, CALL 911 and be transported to the hospital emergency room.  If you develope a fever above 101 F, pus (white drainage) or increased drainage or redness at the wound, or calf pain, call your surgeon's office.   Change dressing    Complete by:  As  directed    Maintain surgical dressing until follow up in the clinic. If the edges start to pull up, may reinforce with tape. If the dressing is no longer working, may remove and cover with gauze and tape, but must keep the area dry and clean.  Call with any questions or concerns.   Constipation Prevention    Complete by:  As directed    Drink plenty of fluids.  Prune juice may be helpful.  You may use a stool softener, such as Colace (over the counter) 100 mg twice a day.  Use MiraLax (over the counter) for constipation as needed.   Diet - low sodium heart healthy    Complete by:  As directed    Discharge instructions    Complete by:  As directed    Maintain surgical dressing until follow up in the clinic. If the edges start to pull up, may reinforce with tape. If the dressing is no longer working, may remove and cover with gauze and tape, but must keep the area dry and clean.  Follow up in 2 weeks at [facility]. Call with any questions or concerns.   Increase activity slowly as tolerated    Complete by:  As directed    Weight bearing as tolerated with assist device (walker, cane, etc) as directed, use it as long as suggested by your surgeon or therapist, typically at least 4-6 weeks.   TED hose    Complete by:  As directed    Use stockings (TED hose) for 2 weeks on both leg(s).  You may remove them at night for sleeping.      Allergies as of 01/19/2017   No Known Allergies     Medication List    TAKE these medications   amLODipine 5 MG tablet Commonly known as:  NORVASC Take 5 mg by mouth daily.   aspirin 81 MG chewable tablet Commonly known as:  ASPIRIN CHILDRENS Chew 1 tablet (81 mg total) by mouth 2 (two) times daily.   citalopram 40 MG tablet Commonly known as:  CELEXA Take 20 mg by mouth daily.   docusate sodium 100 MG capsule Commonly known as:  COLACE Take 1 capsule (100 mg total) by mouth 2 (two) times daily.   ferrous sulfate 325 (65 FE) MG  tablet Commonly known as:  FERROUSUL Take 1 tablet (325 mg total) by mouth 3 (three) times daily with meals.   gabapentin 300 MG capsule Commonly known as:  NEURONTIN Take 600 mg by mouth 2 (two) times daily.   HYDROcodone-acetaminophen 7.5-325 MG tablet Commonly known as:  NORCO Take 1-2 tablets by mouth every 4 (four) hours as needed for moderate pain or severe pain.   methocarbamol 500 MG tablet Commonly known as:  ROBAXIN Take 1 tablet (500 mg total) by mouth every 6 (six) hours as needed for muscle spasms.   polyethylene glycol packet Commonly known as:  MIRALAX / GLYCOLAX Take 17 g by mouth 2 (two) times daily.   TRESIBA FLEXTOUCH 200 UNIT/ML Sopn Generic drug:  Insulin Degludec Inject 10 Units into the skin at bedtime. Only if blood sugar is over 120            Discharge Care Instructions        Start     Ordered   01/19/17 0000  Call MD / Call 911    Comments:  If you experience chest pain or shortness of breath, CALL 911 and be transported to the hospital emergency room.  If you develope a fever above 101 F, pus (white drainage) or increased drainage or redness at the wound, or calf pain, call your surgeon's office.   01/19/17 0846   01/19/17 0000  Discharge instructions    Comments:  Maintain surgical dressing until follow up in the clinic. If the edges start to pull up, may reinforce with tape. If the dressing is no longer working, may remove and cover with gauze and tape, but must keep the area dry and clean.  Follow up in 2 weeks at [facility]. Call with any questions or concerns.   01/19/17 0846   01/19/17 0000  Diet - low sodium heart healthy     01/19/17 0846   01/19/17 0000  Constipation Prevention    Comments:  Drink plenty of fluids.  Prune juice may be helpful.  You may use a stool softener, such as Colace (over the counter) 100 mg twice a day.  Use MiraLax (over the counter) for constipation as needed.   01/19/17 0846   01/19/17 0000   Increase activity slowly as tolerated    Comments:  Weight bearing as tolerated with assist device (walker, cane, etc) as directed, use it as long as suggested by your surgeon or therapist, typically at least 4-6 weeks.   01/19/17 0846   01/19/17 0000  TED hose    Comments:  Use stockings (TED hose) for 2 weeks on both leg(s).  You may remove them at night for sleeping.   01/19/17 0846   01/19/17 0000  Change dressing    Comments:  Maintain surgical dressing until follow up in the clinic. If the edges start to pull up, may reinforce with tape. If the dressing is no longer working, may remove and cover with gauze and tape, but must keep the area dry and clean.  Call with any questions or concerns.   01/19/17 0846   01/18/17 0000  aspirin (ASPIRIN CHILDRENS) 81 MG chewable tablet  2 times daily    Question:  Supervising Provider  Answer:  Durene Romans   01/18/17 1232   01/18/17 0000  docusate sodium (COLACE) 100 MG capsule  2 times daily    Question:  Supervising Provider  Answer:  Durene Romans   01/18/17 1232   01/18/17 0000  ferrous sulfate (FERROUSUL) 325 (65 FE) MG tablet  3 times daily with meals    Question:  Supervising Provider  Answer:  Durene Romans   01/18/17 1232   01/18/17 0000  polyethylene glycol (MIRALAX / GLYCOLAX) packet  2 times daily    Question:  Supervising Provider  Answer:  Charlann Boxer, Molli Hazard   01/18/17 1232   01/18/17 0000  methocarbamol (ROBAXIN) 500 MG tablet  Every 6 hours PRN    Question:  Supervising Provider  Answer:  Durene Romans   01/18/17 1232   01/18/17 0000  HYDROcodone-acetaminophen (NORCO) 7.5-325 MG tablet  Every 4 hours PRN    Question:  Supervising Provider  AnswerCharlann Boxer, Molli Hazard   01/18/17 1232  Signed: Anastasio Auerbach. Hasten Sweitzer   PA-C  01/21/2017, 9:16 AM

## 2017-09-06 ENCOUNTER — Encounter (HOSPITAL_COMMUNITY): Payer: Self-pay | Admitting: Orthopedic Surgery

## 2017-09-06 NOTE — OR Nursing (Signed)
Patient called to correct surgical procedure, originally posted as a conversion to total hip , per patient and procedure documentation; the procedure performed was a conversion to total knee.

## 2018-02-06 ENCOUNTER — Other Ambulatory Visit (HOSPITAL_COMMUNITY): Payer: Self-pay | Admitting: Orthopedic Surgery

## 2018-02-06 DIAGNOSIS — M7989 Other specified soft tissue disorders: Principal | ICD-10-CM

## 2018-02-06 DIAGNOSIS — M79604 Pain in right leg: Secondary | ICD-10-CM

## 2018-02-07 ENCOUNTER — Ambulatory Visit (HOSPITAL_COMMUNITY)
Admission: RE | Admit: 2018-02-07 | Discharge: 2018-02-07 | Disposition: A | Discharge status: Home / Self Care | Payer: Worker's Compensation | Source: Ambulatory Visit | Attending: Family Medicine | Admitting: Family Medicine

## 2018-02-07 DIAGNOSIS — M79604 Pain in right leg: Secondary | ICD-10-CM | POA: Insufficient documentation

## 2018-02-07 DIAGNOSIS — M7989 Other specified soft tissue disorders: Secondary | ICD-10-CM | POA: Diagnosis not present

## 2018-02-07 DIAGNOSIS — M79662 Pain in left lower leg: Secondary | ICD-10-CM | POA: Insufficient documentation

## 2018-02-07 DIAGNOSIS — M79661 Pain in right lower leg: Secondary | ICD-10-CM | POA: Insufficient documentation

## 2018-02-07 DIAGNOSIS — M79605 Pain in left leg: Secondary | ICD-10-CM | POA: Insufficient documentation

## 2018-02-07 NOTE — Progress Notes (Signed)
*  Preliminary Results* Right lower extremity venous duplex completed. Right lower extremity is negative for deep vein thrombosis. There is no evidence of right Baker's cyst.  02/07/2018 4:14 PM  Kelli Ruiz

## 2020-12-05 LAB — COLOGUARD
COLOGUARD: NEGATIVE
Cologuard: NEGATIVE

## 2021-02-20 ENCOUNTER — Other Ambulatory Visit: Payer: Self-pay

## 2021-02-20 ENCOUNTER — Ambulatory Visit
Admission: RE | Admit: 2021-02-20 | Discharge: 2021-02-20 | Disposition: A | Discharge status: Home / Self Care | Payer: Medicare Other | Source: Ambulatory Visit | Attending: Student | Admitting: Student

## 2021-02-20 ENCOUNTER — Other Ambulatory Visit: Payer: Self-pay | Admitting: Student

## 2021-02-20 DIAGNOSIS — M25552 Pain in left hip: Secondary | ICD-10-CM

## 2021-10-02 ENCOUNTER — Other Ambulatory Visit: Payer: Self-pay | Admitting: Student

## 2021-10-02 DIAGNOSIS — E2839 Other primary ovarian failure: Secondary | ICD-10-CM

## 2021-10-02 DIAGNOSIS — Z1231 Encounter for screening mammogram for malignant neoplasm of breast: Secondary | ICD-10-CM

## 2021-10-29 ENCOUNTER — Ambulatory Visit
Admission: RE | Admit: 2021-10-29 | Discharge: 2021-10-29 | Disposition: A | Discharge status: Home / Self Care | Payer: PRIVATE HEALTH INSURANCE | Source: Ambulatory Visit | Attending: Student | Admitting: Student

## 2021-10-29 DIAGNOSIS — Z1231 Encounter for screening mammogram for malignant neoplasm of breast: Secondary | ICD-10-CM

## 2021-10-29 DIAGNOSIS — E2839 Other primary ovarian failure: Secondary | ICD-10-CM

## 2021-11-14 ENCOUNTER — Other Ambulatory Visit: Payer: Self-pay

## 2021-11-14 ENCOUNTER — Emergency Department (HOSPITAL_COMMUNITY)
Admission: EM | Admit: 2021-11-14 | Discharge: 2021-11-14 | Disposition: A | Discharge status: Home / Self Care | Payer: Medicare Other | Attending: Emergency Medicine | Admitting: Emergency Medicine

## 2021-11-14 ENCOUNTER — Encounter (HOSPITAL_COMMUNITY): Payer: Self-pay | Admitting: *Deleted

## 2021-11-14 DIAGNOSIS — R251 Tremor, unspecified: Secondary | ICD-10-CM | POA: Diagnosis not present

## 2021-11-14 DIAGNOSIS — R2233 Localized swelling, mass and lump, upper limb, bilateral: Secondary | ICD-10-CM | POA: Insufficient documentation

## 2021-11-14 DIAGNOSIS — Z79899 Other long term (current) drug therapy: Secondary | ICD-10-CM | POA: Diagnosis not present

## 2021-11-14 DIAGNOSIS — M7989 Other specified soft tissue disorders: Secondary | ICD-10-CM | POA: Diagnosis not present

## 2021-11-14 DIAGNOSIS — Z794 Long term (current) use of insulin: Secondary | ICD-10-CM | POA: Diagnosis not present

## 2021-11-14 DIAGNOSIS — R2230 Localized swelling, mass and lump, unspecified upper limb: Secondary | ICD-10-CM | POA: Diagnosis not present

## 2021-11-14 LAB — COMPREHENSIVE METABOLIC PANEL
ALT: 25 U/L (ref 0–44)
AST: 28 U/L (ref 15–41)
Albumin: 4.2 g/dL (ref 3.5–5.0)
Alkaline Phosphatase: 60 U/L (ref 38–126)
Anion gap: 11 (ref 5–15)
BUN: 12 mg/dL (ref 8–23)
CO2: 23 mmol/L (ref 22–32)
Calcium: 9.9 mg/dL (ref 8.9–10.3)
Chloride: 105 mmol/L (ref 98–111)
Creatinine, Ser: 0.73 mg/dL (ref 0.44–1.00)
GFR, Estimated: >60 mL/min (ref >60)
Glucose, Bld: 165 mg/dL — ABNORMAL HIGH (ref 70–99)
Potassium: 4.5 mmol/L (ref 3.5–5.1)
Sodium: 139 mmol/L (ref 135–145)
Total Bilirubin: 0.9 mg/dL (ref 0.3–1.2)
Total Protein: 8 g/dL (ref 6.5–8.1)

## 2021-11-14 LAB — CBC
HCT: 44 % (ref 36.0–46.0)
Hemoglobin: 14.8 g/dL (ref 12.0–15.0)
MCH: 30.2 pg (ref 26.0–34.0)
MCHC: 33.6 g/dL (ref 30.0–36.0)
MCV: 89.8 fL (ref 80.0–100.0)
Platelets: 273 10*3/uL (ref 150–400)
RBC: 4.9 MIL/uL (ref 3.87–5.11)
RDW: 13.2 % (ref 11.5–15.5)
WBC: 7.6 10*3/uL (ref 4.0–10.5)
nRBC: 0 % (ref 0.0–0.2)

## 2021-11-14 NOTE — ED Triage Notes (Signed)
Pt has had pain and swelling in bil hands, has seen PCP without results of cause.

## 2021-11-14 NOTE — ED Provider Triage Note (Signed)
Emergency Medicine Provider Triage Evaluation Note  Kelli Ruiz , a 66 y.o. female  was evaluated in triage.  Pt complains of hand swelling and tremor in left hand.   Seems that she has been having symptoms for several months.  She came to the ER today because of frustration with continued symptoms   Seems to be having pain primarily across the MCPs of both hands.  Seems to have pain with movement worse pain at night.  No night sweats.  Seems she has been seen by PCP but her sx have worsened and now has night pain in hands. Seen may 18 and June 15.   Review of Systems  Positive: Hand pain  Negative:   Physical Exam  BP 101/68 (BP Location: Right Arm)   Pulse 88   Temp 97.8 F (36.6 C) (Oral)   Resp 18   SpO2 98%  Gen:   Awake, no distress   Resp:  Normal effort  MSK:   Moves extremities without difficulty  Other:  Faint pill-rolling tremor consistently in right hand.  Bilateral hands with discomfort with palpation of the MCP joints.   Medical Decision Making  Medically screening exam initiated at 11:07 AM.  Appropriate orders placed.  Kelli Ruiz was informed that the remainder of the evaluation will be completed by another provider, this initial triage assessment does not replace that evaluation, and the importance of remaining in the ED until their evaluation is complete.  Pt has already had hand xrays. Sx more concerning for RA.    Gailen Shelter, [place] 11/14/21 1134

## 2021-11-14 NOTE — ED Provider Notes (Addendum)
[facility] DEPT Provider Note   CSN: [id number] Arrival date & time: 11/14/21  1051     History  Chief Complaint  Patient presents with   Hand Problem    Kelli Ruiz is a 66 y.o. female.  HPI     66 year old female comes in with chief complaint of hand swelling and tremor.  Patient indicates that over the last 3 weeks she has noted increased swelling to both of her hands, left worse than right.  The swelling is bad enough where it is impacting her lifestyle including ability to grip things and open bottles etc. she has associated discomfort.  She saw her PCP, who ordered an x-ray and on repeat assessment states that they did not know what was causing her symptoms.  Additionally she is also noticing increased tremors, that are new and started somewhere surrounding the swelling.  There is no history of DVT, PE.  Patient denies any history of leg swelling, orthopnea.  She denies any associated fevers or chills.  Pain is primarily at 5 out of 10 range and she has been applying Voltaren gel, which brings the pain down to 2 out of 10.  She has also used a brace with minimal relief.  Home Medications Prior to Admission medications   Medication Sig Start Date End Date Taking? Authorizing Provider  amLODipine (NORVASC) 5 MG tablet Take 5 mg by mouth daily.    [provider]  citalopram (CELEXA) 40 MG tablet Take 20 mg by mouth daily.    [provider]  docusate sodium (COLACE) 100 MG capsule Take 1 capsule (100 mg total) by mouth 2 (two) times daily. 01/18/17   Danae Orleans, PA-C  ferrous sulfate (FERROUSUL) 325 (65 FE) MG tablet Take 1 tablet (325 mg total) by mouth 3 (three) times daily with meals. 01/18/17   Danae Orleans, PA-C  gabapentin (NEURONTIN) 300 MG capsule Take 600 mg by mouth 2 (two) times daily.     [provider]  HYDROcodone-acetaminophen (NORCO) 7.5-325 MG tablet Take 1-2 tablets by mouth every 4 (four) hours  as needed for moderate pain or severe pain. 01/18/17   Danae Orleans, PA-C  Insulin Degludec (TRESIBA FLEXTOUCH) 200 UNIT/ML SOPN Inject 10 Units into the skin at bedtime. Only if blood sugar is over 120    [provider]  methocarbamol (ROBAXIN) 500 MG tablet Take 1 tablet (500 mg total) by mouth every 6 (six) hours as needed for muscle spasms. 01/18/17   Danae Orleans, PA-C  polyethylene glycol (MIRALAX / GLYCOLAX) packet Take 17 g by mouth 2 (two) times daily. 01/18/17   Danae Orleans, PA-C      Allergies    Patient has no known allergies.    Review of Systems   Review of Systems  All other systems reviewed and are negative.   Physical Exam Updated Vital Signs BP 101/68 (BP Location: Right Arm)   Pulse 88   Temp 97.8 F (36.6 C) (Oral)   Resp 18   Ht 5\' 4"  (1.626 m)   SpO2 98%   BMI 31.41 kg/m  Physical Exam Vitals and nursing note reviewed.  Constitutional:      Appearance: She is well-developed.  HENT:     Head: Atraumatic.  Cardiovascular:     Rate and Rhythm: Normal rate.  Pulmonary:     Effort: Pulmonary effort is normal.  Musculoskeletal:        General: Swelling and tenderness present.     Cervical  back: Normal range of motion and neck supple.  Skin:    General: Skin is warm and dry.  Neurological:     Mental Status: She is alert and oriented to person, place, and time.     Comments: Patient primarily has a tremor when she is lifting her arm against gravity.  The tremor improves but does not resolve with activity such as finger-to-nose.  Tremor also improves with her holding an object but does not resolve completely.            ED Results / Procedures / Treatments   Labs (all labs ordered are listed, but only abnormal results are displayed) Labs Reviewed  COMPREHENSIVE METABOLIC PANEL - Abnormal; Notable for the following components:      Result Value   Glucose, Bld 165 (*)    All other components within normal limits  CBC     EKG None  Radiology No results found.  Procedures Procedures    Medications Ordered in ED Medications - No data to display  ED Course/ Medical Decision Making/ A&P                           Medical Decision Making  This patient presents to the ED with chief complaint(s) of bilateral hand swelling and tremor with pertinent past medical history of diabetes, CKD which further complicates the presenting complaint. The complaint involves an extensive differential diagnosis and also carries with it a high risk of complications and morbidity.    The differential diagnosis for the tremor includes physiologic tremor, essential tremor, drug-induced tremor, Parkinson or parkinsonian tremor.  My suspicion is that patient most likely has essential tremor based on the exam finding, she has been advised to follow-up with her PCP taken sister otherwise neurology.  For the hand swelling, it is bilateral, left worse than right.  There is no left-sided facial swelling.  Symptoms have been present for 3 weeks, the swelling is not worsening.  Differential diagnosis considered includes subclavian thrombosis/stenosis, DVT, cellulitis, rheumatologic arthritis, thoracic outlet syndrome, CHF, low albumin state and anasarca.  Given that the patient's symptoms are bilateral, stable, infection suspicion is quite low.  Same is true for DVT and conditions like subclavian thrombosis and thoracic outlet syndrome.  The etiology is likely more systemic.  Unfortunately, patient's PCP at [facility] have not been very resourceful in this matter.  I think patient probably will benefit by seeing a rheumatologist.  We have advised her to contact her PCP for either a rheumatology consultation or seek rheumatology appointment directly if the insurance allows..  We did discuss with the patient plan to get ultrasound DVT, however patient agrees with our sentiment that the likelihood of DVT in both extremities is quite  low, especially since the swelling is not progressing any further.  Therefore, ultrasound DVT will not be ordered emergently, she will return to the Er if the swelling is worsening.    Additional history obtained: Records reviewed Care Everywhere/External Records reviewed patient's medications and outside records from 2022  We will give patient outpatient neurology follow-up for the referral.  Again, she has been advised to see if she can see her PCP for it, if they are unable to help and her symptoms are debilitating then neurology follow-up is a good idea.  Final Clinical Impression(s) / ED Diagnoses Final diagnoses:  Tremor  Bilateral hand swelling    Rx / DC Orders ED Discharge Orders  None            Derwood Kaplan, MD 11/14/21 1324

## 2021-11-14 NOTE — Discharge Instructions (Signed)
We suspect that your tremor could be an essential tremor or a physiologic tremor, however if your symptoms continue and your primary care doctor cannot assist you, then consider following up with a neurologist for proper diagnosis.  Additionally, for your hand swelling we think you need to see a rheumatologist.  Please contact your insurance company or your primary care doctor to see if you can see a rheumatologist that is part of your network.

## 2021-11-23 ENCOUNTER — Encounter: Payer: Self-pay | Admitting: Family Medicine

## 2021-11-23 ENCOUNTER — Ambulatory Visit (INDEPENDENT_AMBULATORY_CARE_PROVIDER_SITE_OTHER): Payer: Medicare Other | Admitting: Family Medicine

## 2021-11-23 VITALS — BP 136/84 | HR 89 | Temp 97.1°F | Ht 63.5 in | Wt 163.0 lb

## 2021-11-23 DIAGNOSIS — M1711 Unilateral primary osteoarthritis, right knee: Secondary | ICD-10-CM

## 2021-11-23 DIAGNOSIS — Z794 Long term (current) use of insulin: Secondary | ICD-10-CM | POA: Diagnosis not present

## 2021-11-23 DIAGNOSIS — E785 Hyperlipidemia, unspecified: Secondary | ICD-10-CM

## 2021-11-23 DIAGNOSIS — E119 Type 2 diabetes mellitus without complications: Secondary | ICD-10-CM | POA: Insufficient documentation

## 2021-11-23 DIAGNOSIS — F3342 Major depressive disorder, recurrent, in full remission: Secondary | ICD-10-CM

## 2021-11-23 DIAGNOSIS — I152 Hypertension secondary to endocrine disorders: Secondary | ICD-10-CM

## 2021-11-23 DIAGNOSIS — E1165 Type 2 diabetes mellitus with hyperglycemia: Secondary | ICD-10-CM | POA: Diagnosis not present

## 2021-11-23 DIAGNOSIS — E1159 Type 2 diabetes mellitus with other circulatory complications: Secondary | ICD-10-CM

## 2021-11-23 DIAGNOSIS — E1169 Type 2 diabetes mellitus with other specified complication: Secondary | ICD-10-CM | POA: Diagnosis not present

## 2021-11-23 DIAGNOSIS — G4733 Obstructive sleep apnea (adult) (pediatric): Secondary | ICD-10-CM

## 2021-11-23 DIAGNOSIS — R251 Tremor, unspecified: Secondary | ICD-10-CM | POA: Diagnosis not present

## 2021-11-23 DIAGNOSIS — M7989 Other specified soft tissue disorders: Secondary | ICD-10-CM

## 2021-11-23 MED ORDER — BLOOD GLUCOSE METER KIT: PACK | 0 refills | Status: AC

## 2021-11-23 MED ORDER — ROSUVASTATIN CALCIUM 10 MG PO TABS: 10.0000 mg | ORAL_TABLET | Freq: Every day | ORAL | 3 refills | Status: DC

## 2021-11-23 NOTE — Assessment & Plan Note (Signed)
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

## 2021-11-23 NOTE — Assessment & Plan Note (Signed)
Asymptomatic Continue Tresiba 10 units nightly Glucometer sent

## 2021-11-23 NOTE — Assessment & Plan Note (Signed)
On rosuvastatin 10 mg

## 2021-11-23 NOTE — Assessment & Plan Note (Signed)
Sudden onset Treatment with rest and movement, differential includes DVT versus parkinsonian versus inflammatory etiology (is associated with recent onset hand swelling) Given worsening and affecting ADLs such as eating drinking Recommend referral to neurology

## 2021-11-23 NOTE — Progress Notes (Signed)
Kelli Ruiz is a 66 y.o. female who presents today for an office visit.  Assessment/Plan:   Problem List Items Addressed This Visit       Cardiovascular and Mediastinum   Hypertension associated with diabetes ([place]) - Primary    Well-controlled on amlodipine 5 mg      Relevant Medications   metFORMIN (GLUCOPHAGE) 500 MG tablet   amLODipine (NORVASC) 5 MG tablet   rosuvastatin (CRESTOR) 10 MG tablet     Respiratory   OSA (obstructive sleep apnea)     Endocrine   Type 2 diabetes mellitus with hyperglycemia (HCC)    Asymptomatic Continue Tresiba 10 units nightly Glucometer sent      Relevant Medications   metFORMIN (GLUCOPHAGE) 500 MG tablet   rosuvastatin (CRESTOR) 10 MG tablet   blood glucose meter kit and supplies   Hyperlipidemia associated with type 2 diabetes mellitus (HCC)    On rosuvastatin 10 mg      Relevant Medications   metFORMIN (GLUCOPHAGE) 500 MG tablet   amLODipine (NORVASC) 5 MG tablet   rosuvastatin (CRESTOR) 10 MG tablet     Other   Tremor    Sudden onset Treatment with rest and movement, differential includes DVT versus parkinsonian versus inflammatory etiology (is associated with recent onset hand swelling) Given worsening and affecting ADLs such as eating drinking Recommend referral to neurology       Relevant Orders   Ambulatory referral to Neurology   Swelling of both hands    Bilateral and tender Unlikely DVT given bilateral nature, recent lab work CMP for liver or renal dysfunction, and CBC without leukocytosis or anemia, decreasing the likelihood of infection Favors autoimmune disease RA, 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 female who has S/P conversion right knee; Obese; Type 2 diabetes mellitus with hyperglycemia ([place]); Hypertension associated with diabetes ([place]); Hyperlipidemia associated with type 2 diabetes mellitus ([place]); Tremor; OSA (obstructive sleep apnea); and Swelling of both hands on their problem list..   She  has a past medical history of Arthritis, Complication of anesthesia, Depression, Diabetes mellitus without complication ([place]), Hypertension, RLS (restless legs syndrome), and Sleep apnea..   She presents with chief complaint of Establish Care (Patient states that when she wakes up in the morning she has swelling in her hands and they shake as well. She states that theres a pain that shoots down to her fingernails. Patient scheduled for colorguard.) .   Patient here to establish care.  Patient over the past month has developed bilateral hand swelling and tremor.  Reports that the swelling and the tremor are worsening.  They occur at rest and with movement.  Patient did go to the emergency department on 07/05.  CBC and CMP were obtained.  There was no anemia or leukocytosis.  CMP significant for elevated blood glucose at 165, remainder of labs were normal including creatinine.  Patient reports that her previous PCP did get x-rays of the hands and reports that  they were "normal".   Patient history of diabetes.  She reports that her A1c has been around 7 over the past 3 months.  She has not been able to check home blood sugars due to lack of glucometer and strips.  Denies any symptoms of hypoglycemia including headache, feeling tired.  Denies polyuria polydipsia.  Patient has history of hypertension.  Is controlled on amlodipine 5 milligrams.  Patient has no chest pain, shortness of breath, lower extremity swelling, headache, blurry vision.  Patient has a history of elevated  cholesterol.  Rosuvastatin 10 mg.  Patient has history of depression.  This is controlled on citalopram.  Patient has history of chronic pain.  Predominantly in her right knee.  Pain is controlled with topical Voltaren gel and gabapentin.  Patient has OSA.  She sleeps with a CPAP.  She says that her CPAP machine is wearing out and she needs a new one.       Objective:  Physical Exam: BP 136/84 (BP Location: Left Arm, Patient Position: Sitting, Cuff Size: Large)   Pulse 89   Temp (!) 97.1 F (36.2 C) (Temporal)   Ht 5' 3.5" (1.613 m)   Wt 163 lb (73.9 kg)   SpO2 96%   BMI 28.42 kg/m    General: No acute distress. Awake and conversant.  Eyes: Normal conjunctiva, anicteric. Round symmetric pupils.  ENT: Hearing grossly intact. No nasal discharge.  Neck: Neck is supple. No masses or thyromegaly.  Respiratory: Respirations are non-labored. No auditory wheezing.  Skin: Warm. No rashes or ulcers.  Psych: Alert and oriented. Cooperative, Appropriate mood and affect, Normal judgment.  CV: No cyanosis or JVD MSK: Normal ambulation. No clubbing, patient has bilateral swelling in her hands, there tender to palpate, and warm to touch Neuro: Sensation and CN II-XII grossly normal.  Patient has resting tremor of bilateral arms and hands, and mildly of the head.  She also has tremor with movement extension and flexion moving arm, however this is less pronounced with movement.       Alesia Banda, MD, MS

## 2021-11-23 NOTE — Assessment & Plan Note (Signed)
Well controlled on amlodipine 5 mg.

## 2021-11-30 ENCOUNTER — Ambulatory Visit (INDEPENDENT_AMBULATORY_CARE_PROVIDER_SITE_OTHER): Payer: Medicare Other | Admitting: Family Medicine

## 2021-11-30 ENCOUNTER — Encounter: Payer: Self-pay | Admitting: Family Medicine

## 2021-11-30 VITALS — BP 138/84 | HR 83 | Temp 97.7°F | Wt 164.2 lb

## 2021-11-30 DIAGNOSIS — Z794 Long term (current) use of insulin: Secondary | ICD-10-CM | POA: Diagnosis not present

## 2021-11-30 DIAGNOSIS — E1165 Type 2 diabetes mellitus with hyperglycemia: Secondary | ICD-10-CM | POA: Diagnosis not present

## 2021-11-30 DIAGNOSIS — M7989 Other specified soft tissue disorders: Secondary | ICD-10-CM

## 2021-11-30 LAB — C-REACTIVE PROTEIN: CRP: <1 mg/dL (ref 0.5–20.0)

## 2021-11-30 LAB — SEDIMENTATION RATE: Sed Rate: 39 mm/hr — ABNORMAL HIGH (ref 0–30)

## 2021-11-30 LAB — URIC ACID: Uric Acid, Serum: 5.2 mg/dL (ref 2.4–7.0)

## 2021-11-30 LAB — HEMOGLOBIN A1C: Hgb A1c MFr Bld: 7.9 % — ABNORMAL HIGH (ref 4.6–6.5)

## 2021-11-30 MED ORDER — PREDNISONE 10 MG (21) PO TBPK: ORAL_TABLET | ORAL | 0 refills | Status: DC

## 2021-11-30 NOTE — Progress Notes (Signed)
.  rt

## 2021-11-30 NOTE — Progress Notes (Signed)
Assessment/Plan:   Problem List Items Addressed This Visit       Endocrine   Type 2 diabetes mellitus with hyperglycemia ([place]) - Primary    Home CBGs normal Asked patient to monitor them closely while on steroids Recheck hemoglobin A1c today      Relevant Orders   Hemoglobin A1C     Other   Swelling of both hands    Concern for inflammatory arthropathies including rheumatoid arthritis, psoriatic arthritis, SLE, gout X-rays of hands recently were negative for OA Prednisone course pack as prescribed ESR, CRP, uric acid, ANA with reflex, RF panel Follow-up 2 weeks      Relevant Medications   predniSONE (STERAPRED UNI-PAK 21 TAB) 10 MG (21) TBPK tablet   Other Relevant Orders   Rheumatoid Arthritis Profile   Sedimentation rate   C-reactive protein   Uric acid   ANA w/Reflex       Subjective:  HPI:  Kelli Ruiz is a 66 y.o. female who has S/P conversion right knee; Obese; Type 2 diabetes mellitus with hyperglycemia ([place]); Hypertension associated with diabetes ([place]); Hyperlipidemia associated with type 2 diabetes mellitus ([place]); Tremor; OSA (obstructive sleep apnea); and Swelling of both hands on their problem list..   She  has a past medical history of Arthritis, Complication of anesthesia, Depression, Diabetes mellitus without complication ([place]), Hypertension, RLS (restless legs syndrome), and Sleep apnea..   She presents with chief complaint of Follow-up (1 week follow up swelling and pain in hands. Pt states that she is still experiencing pain) .  DIABETES Type II, established problem, Stable Medications: None Blood Sugars per patient: Fasting: 130s  diet-low-carb   ROS: Denies Polyuria,Polydipsia, Denies  Hypoglycemia symptoms (palpitations,  anxiousness)   Patient reports hand swelling has gotten worse.  Tremor is also gotten worse.  Prior work-up showed negative hand x-rays.  CBC was normal and CMP had elevated glucose at 160s.  Patient denies any fevers or  chills or rash.    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     amLODipine (NORVASC) 5 MG tablet Take 1 tablet by mouth daily.     blood glucose meter kit and supplies Dispense based on patient and insurance preference. Use up to four times daily as directed. (FOR ICD-10 E10.9, E11.9). 1 each 0   citalopram (CELEXA) 40 MG tablet Take 20 mg by mouth daily.     docusate sodium (COLACE) 100 MG capsule Take 1 capsule (100 mg total) by mouth 2 (two) times daily. 10 capsule 0   ferrous sulfate (FERROUSUL) 325 (65 FE) MG tablet Take 1 tablet (325 mg total) by mouth 3 (three) times daily with meals.  3   gabapentin (NEURONTIN) 600 MG tablet Take 600 mg by mouth 2 (two) times daily.     HYDROcodone-acetaminophen ([place])  7.5-325 MG tablet Take 1-2 tablets by mouth every 4 (four) hours as needed for moderate pain or severe pain. 60 tablet 0   Insulin Degludec (TRESIBA FLEXTOUCH) 200 UNIT/ML SOPN Inject 10 Units into the skin at bedtime. Only if blood sugar is over 120     Insulin Pen Needle 32G X 4 MM MISC Use with insulin once daily  DX R73.03 and Z79.4     methocarbamol (ROBAXIN) 500 MG tablet Take 1 tablet (500 mg total) by mouth every 6 (six) hours as needed for muscle spasms. 40 tablet 0   polyethylene glycol (MIRALAX / GLYCOLAX) packet Take 17 g by mouth 2 (two) times daily.  14 each 0   rosuvastatin (CRESTOR) 10 MG tablet Take 1 tablet (10 mg total) by mouth at bedtime. 90 tablet 3   metFORMIN (GLUCOPHAGE) 500 MG tablet Take 500 mg by mouth 2 (two) times daily. (Patient not taking: Reported on 11/23/2021)     No facility-administered medications prior to visit.    No family history on file.  Social History   Socioeconomic History   Marital status: Single    Spouse name: Not on file   Number of children: Not on file   Years of education: Not on file   Highest education level: Not on file  Occupational History   Not on file  Tobacco Use   Smoking status: Former    Years: 20.00    Types: Cigarettes    Quit date: 01/09/2017    Years since quitting: 4.8   Smokeless tobacco: Never   Tobacco comments:    Pt uses vapor  Vaping Use   Vaping Use: Some days  Substance and Sexual Activity   Alcohol use: No   Drug use: No   Sexual activity: Not on file  Other Topics Concern   Not on file  Social History Narrative   Not on file   Social Determinants of Health   Financial Resource Strain: Not on file  Food Insecurity: Not on file  Transportation Needs: Not on file  Physical Activity: Not on file  Stress: Not on file  Social Connections: Not on file  Intimate Partner Violence: Not on file                                                                                                 Objective:  Physical Exam: BP 138/84 (BP Location: Left Arm, Patient Position: Sitting, Cuff Size: Large)   Pulse 83   Temp 97.7 F (36.5 C) (Temporal)   Wt 164 lb 3.2 oz (74.5 kg)   SpO2 94%   BMI 28.63 kg/m    General: No acute distress. Awake and conversant.  Eyes: Normal conjunctiva, anicteric. Round symmetric pupils.  ENT: Hearing grossly intact. No nasal discharge.  Neck: Neck is supple. No masses or thyromegaly.  Respiratory: Respirations are non-labored. No auditory wheezing.  Skin: Warm. No rashes or ulcers.  Psych: Alert and oriented. Cooperative,  Appropriate mood and affect, Normal judgment.  CV: No cyanosis or JVD MSK: Bilateral swelling and tenderness in hands and knuckles, decreased grip strength due  to pain Neuro: Sensation and CN II-XII grossly normal, bilateral resting tremor       Alesia Banda, MD, MS

## 2021-11-30 NOTE — Assessment & Plan Note (Signed)
Concern for inflammatory arthropathies including rheumatoid arthritis, psoriatic arthritis, SLE, gout X-rays of hands recently were negative for OA Prednisone course pack as prescribed ESR, CRP, uric acid, ANA with reflex, RF panel Follow-up 2 weeks

## 2021-11-30 NOTE — Assessment & Plan Note (Signed)
Home CBGs normal Asked patient to monitor them closely while on steroids Recheck hemoglobin A1c today

## 2021-11-30 NOTE — Patient Instructions (Signed)
Take prednisone pack as directed Check blood sugars daily, if elevated above 180, call office We are checking lab work for inflammatory arthritis

## 2021-12-02 ENCOUNTER — Telehealth: Payer: Self-pay | Admitting: Family Medicine

## 2021-12-02 LAB — RHEUMATOID ARTHRITIS PROFILE
Cyclic Citrullin Peptide Ab: 5 units (ref 0–19)
Rheumatoid fact SerPl-aCnc: <10 IU/mL (ref <14.0)

## 2021-12-02 LAB — ANA W/REFLEX: Anti Nuclear Antibody (ANA): NEGATIVE

## 2021-12-02 NOTE — Telephone Encounter (Signed)
Caller Name: Clinton Wahlberg  Call back phone #: [phone]  Reason for Call: Says sugar levels have elevated, just wanted to check in with Dr. Janee Morn.

## 2021-12-03 ENCOUNTER — Other Ambulatory Visit: Payer: Self-pay | Admitting: Family Medicine

## 2021-12-03 DIAGNOSIS — M1990 Unspecified osteoarthritis, unspecified site: Secondary | ICD-10-CM

## 2021-12-03 DIAGNOSIS — R700 Elevated erythrocyte sedimentation rate: Secondary | ICD-10-CM

## 2021-12-03 NOTE — Telephone Encounter (Signed)
Advised patient of annotation below and she verbalized understanding. Patient stated swelling has gotten better.

## 2021-12-03 NOTE — Telephone Encounter (Signed)
Spoke with patient and verified dob. She states that yesterday her sugar levels were 234,254 and dropped to 158 and she began to have the shakes. Please advise.

## 2021-12-04 ENCOUNTER — Telehealth: Payer: Self-pay | Admitting: Family Medicine

## 2021-12-04 DIAGNOSIS — E1165 Type 2 diabetes mellitus with hyperglycemia: Secondary | ICD-10-CM

## 2021-12-04 MED ORDER — LANCETS MISC: 1.0000 | Freq: Four times a day (QID) | 12 refills | Status: DC

## 2021-12-04 MED ORDER — GLUCOSE BLOOD VI STRP: ORAL_STRIP | 12 refills | Status: DC

## 2021-12-04 NOTE — Telephone Encounter (Signed)
Please advise, see below.   

## 2021-12-04 NOTE — Telephone Encounter (Signed)
Pt need new prescription sent about test strips/lancets.please send to pharmacy @  [id number] - [place], [place] - [address] AT [facility] OF [address] Phone:  [phone]     Pt stated that she is almost out of strips. Please call pt

## 2021-12-07 NOTE — Telephone Encounter (Signed)
Patient was able to pickup medication 

## 2021-12-14 ENCOUNTER — Ambulatory Visit (INDEPENDENT_AMBULATORY_CARE_PROVIDER_SITE_OTHER): Payer: Medicare Other | Admitting: Family Medicine

## 2021-12-14 ENCOUNTER — Encounter: Payer: Self-pay | Admitting: Family Medicine

## 2021-12-14 VITALS — BP 124/78 | HR 72 | Temp 97.3°F | Wt 163.0 lb

## 2021-12-14 DIAGNOSIS — Z794 Long term (current) use of insulin: Secondary | ICD-10-CM | POA: Diagnosis not present

## 2021-12-14 DIAGNOSIS — M7989 Other specified soft tissue disorders: Secondary | ICD-10-CM

## 2021-12-14 DIAGNOSIS — R251 Tremor, unspecified: Secondary | ICD-10-CM | POA: Diagnosis not present

## 2021-12-14 DIAGNOSIS — E1165 Type 2 diabetes mellitus with hyperglycemia: Secondary | ICD-10-CM

## 2021-12-14 LAB — HM DIABETES EYE EXAM

## 2021-12-14 MED ORDER — DAPAGLIFLOZIN PROPANEDIOL 5 MG PO TABS: 5.0000 mg | ORAL_TABLET | Freq: Every day | ORAL | 2 refills | Status: DC

## 2021-12-14 NOTE — Progress Notes (Signed)
Assessment/Plan:   Problem List Items Addressed This Visit       Endocrine   Type 2 diabetes mellitus with hyperglycemia (HCC)    Recent A1c elevated at 7.9 Trial Farxiga 5 mg Referral to CCM Follow-up 1 month      Relevant Medications   dapagliflozin propanediol (FARXIGA) 5 MG TABS tablet   Other Relevant Orders   CCM pharmacy monitoring   Ambulatory referral to diabetic education   AMB Referral to [facility]     Other   Tremor - Primary    Sudden onset, improved Predominantly at rest Did discuss referral to neurology, however patient would like to work on diabetes and ongoing hand swelling.   Continue monitor, if worsening, consider treatment for ET and recommend referral to neurology       Swelling of both hands    Recent lab work positive for ESR 39.  Remainder of work-up negative for rheumatoid, negative ANA, negative CRP, negative uric acid  Reports x-rays of hands recently were negative for OA from outside provider Continue to monitor, if improvement in diabetes, can consider rechallenge with low-dose steroids          Subjective:  HPI:  Kelli Ruiz is a 66 y.o. female who has S/P conversion right knee; Obese; Type 2 diabetes mellitus with hyperglycemia ([place]); Hypertension associated with diabetes ([place]); Hyperlipidemia associated with type 2 diabetes mellitus ([place]); Tremor; OSA (obstructive sleep apnea); and Swelling of both hands on their problem list..   She  has a past medical history of Arthritis, Complication of anesthesia, Depression, Diabetes mellitus without complication ([place]), Hypertension, RLS (restless legs syndrome), and Sleep apnea..   She presents with chief complaint of Follow-up (2 week follow up on swelling in hands. She states that swelling has gotten better. She states that she had to stop steroids due to sugars running high (Over 200)) .   DIABETES Type II, established problem, Medications: None,  Blood Sugars per  patient: Fasting: In the 130s to 150s, does report some symptoms of hypoglycemia which are improved before she is, but patient blood sugar does not reflect any in her at this time Diet-regular Mediterranean diet Regular Exercise-walks dog regularly Interim History-recently started on prednisone due to bilateral hand swelling, this was DC'd due to blood sugars being consistently in the 200s  ROS: Denies Polyuria,Polydipsia, reports some hypoglycemia symptoms shakiness,, feeling "bad", overall rare and reduce frequency  Bilateral hand pain swelling.  Associated tremor.  Reports improvement after steroids, although still present.  Reports tremor is improved. Patient with recently elevated ESR.  Has been referred to rheumatology.   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     amLODipine (NORVASC) 5 MG tablet Take 1 tablet by mouth daily.     blood glucose meter kit and  supplies Dispense based on patient and insurance preference. Use up to four times daily as directed. (FOR ICD-10 E10.9, E11.9). 1 each 0   citalopram (CELEXA) 40 MG tablet Take 20 mg by mouth daily.     docusate sodium (COLACE) 100 MG capsule Take 1 capsule (100 mg total) by mouth 2 (two) times daily. 10 capsule 0   ferrous sulfate (FERROUSUL) 325 (65 FE) MG tablet Take 1 tablet (325 mg total) by mouth 3  (three) times daily with meals.  3   gabapentin (NEURONTIN) 600 MG tablet Take 600 mg by mouth 2 (two) times daily.     glucose blood test strip Use as instructed 100 each 12   HYDROcodone-acetaminophen (NORCO) 7.5-325 MG tablet Take 1-2 tablets by mouth every 4 (four) hours as needed for moderate pain or severe pain. 60 tablet 0   Insulin Degludec (TRESIBA FLEXTOUCH) 200 UNIT/ML SOPN Inject 10 Units into the skin at bedtime. Only if blood sugar is over 120     Insulin Pen Needle 32G X 4 MM MISC Use with insulin once daily  DX R73.03 and Z79.4     Lancets MISC 1 each by Does not apply route in the morning, at noon, in the evening, and at bedtime. 100 each 12   metFORMIN (GLUCOPHAGE) 500 MG tablet Take 500 mg by mouth 2 (two) times daily.     methocarbamol (ROBAXIN) 500 MG tablet Take 1 tablet (500 mg total) by mouth every 6 (six) hours as needed for muscle spasms. 40 tablet 0   polyethylene glycol (MIRALAX / GLYCOLAX) packet Take 17 g by mouth 2 (two) times daily. 14 each 0   predniSONE (STERAPRED UNI-PAK 21 TAB) 10 MG (21) TBPK tablet Take as directed 1 each 0   rosuvastatin (CRESTOR) 10 MG tablet Take 1 tablet (10 mg total) by mouth at bedtime. 90 tablet 3   No facility-administered medications prior to visit.    No family history on file.  Social History   Socioeconomic History   Marital status: Single    Spouse name: Not on file   Number of children: Not on file   Years of education: Not on file   Highest education level: Not on file  Occupational History   Not on file  Tobacco Use   Smoking status: Former    Years: 20.00    Types: Cigarettes    Quit date: 01/09/2017    Years since quitting: 4.9   Smokeless tobacco: Never   Tobacco comments:    Pt uses vapor  Vaping Use   Vaping Use: Some days  Substance and Sexual Activity   Alcohol use: No   Drug use: No   Sexual activity: Not on file  Other Topics Concern   Not on file  Social History Narrative   Not on file    Social Determinants of Health   Financial Resource Strain: Not on file  Food Insecurity: Not on file  Transportation Needs: Not on file  Physical Activity: Not on file  Stress: Not on file  Social Connections: Not on file  Intimate Partner Violence: Not on file  Objective:  Physical Exam: BP 124/78 (BP Location: Left Arm, Patient Position: Sitting, Cuff Size: Large)   Pulse 72   Temp (!) 97.3 F (36.3 C) (Temporal)   Wt 163 lb (73.9 kg)   SpO2 95%   BMI 28.42 kg/m    Gen: NAD, resting comfortably CV: RRR with no murmurs appreciated Pulm: NWOB, CTAB with no crackles, wheezes, or rhonchi GI: Normal bowel sounds present. Soft, Nontender, Nondistended. MSK: Bilateral tender and swollen hands difficulty with grip due to pain Skin: warm, dry Neuro: grossly normal, moves all extremities Psych: Normal affect and thought content         Alesia Banda, MD, MS

## 2021-12-14 NOTE — Patient Instructions (Addendum)
Keep doing diet and exercise  Start Marcelline Deist We are referring to Chronic Care Management and referring to Diabetic education

## 2021-12-14 NOTE — Assessment & Plan Note (Signed)
Sudden onset, improved Predominantly at rest Did discuss referral to neurology, however patient would like to work on diabetes and ongoing hand swelling.   Continue monitor, if worsening, consider treatment for ET and recommend referral to neurology

## 2021-12-14 NOTE — Assessment & Plan Note (Signed)
Recent A1c elevated at 7.9 Trial Farxiga 5 mg Referral to CCM Follow-up 1 month

## 2021-12-14 NOTE — Assessment & Plan Note (Signed)
Recent lab work positive for ESR 39.  Remainder of work-up negative for rheumatoid, negative ANA, negative CRP, negative uric acid  Reports x-rays of hands recently were negative for OA from outside provider Continue to monitor, if improvement in diabetes, can consider rechallenge with low-dose steroids

## 2021-12-15 ENCOUNTER — Telehealth: Payer: Self-pay | Admitting: *Deleted

## 2021-12-15 NOTE — Chronic Care Management (AMB) (Signed)
  Chronic Care Management   Note  12/15/2021 Name: MARLAYSIA LENIG MRN: [id number] DOB: Mar 24, 1956  Juleen Starr Noblett is a 66 y.o. year old female who is a primary care patient of Bonnita Hollow, MD. I reached out to [employer] by phone today in response to a referral sent by Ms. Gabrille A Barbuto's PCP.  Ms. Bang was given information about Chronic Care Management services today including:  CCM service includes personalized support from designated clinical staff supervised by her physician, including individualized plan of care and coordination with other care providers 24/7 contact phone numbers for assistance for urgent and routine care needs. Service will only be billed when office clinical staff spend 20 minutes or more in a month to coordinate care. Only one practitioner may furnish and bill the service in a calendar month. The patient may stop CCM services at any time (effective at the end of the month) by phone call to the office staff. The patient is responsible for co-pay (up to 20% after annual deductible is met) if co-pay is required by the individual health plan.   Patient agreed to services and verbal consent obtained.   Follow up plan: Telephone appointment with care management team member scheduled for: 12/17/2021  Julian Hy, [place] Direct Dial: [phone]

## 2021-12-16 ENCOUNTER — Telehealth: Payer: Self-pay

## 2021-12-16 NOTE — Progress Notes (Signed)
  Chronic Care Management Pharmacy Assistant   Name: Kelli Ruiz  MRN: [id number] DOB: 11/30/1955  Chart Review for the clinical pharmacist for 12/17/2021 at 9:30 am.   Conditions to be addressed/monitored: HTN, HLD, and DMII  Primary concerns for visit include: Patient states she would like to discuss her new medication Farxgia. Patient states she would like to ask if the clinical pharmacist if he has any suggestion regarding to her hand swelling or what she can do to help with the discomfort. Patient states her PCP may restart her Tresiba , and would like to get the clinical pharmacist opinion , and if she does restart Tresiba if she can get patient assistance.    Recent office visits:  12/14/2021 Dr. Thompson MD (PCP) Trial Farxiga 5 mg, Referral to CCM, Ambulatory referral to diabetic education, Return in about 4 weeks  11/30/2021 Dr. Thompson MD (PCP) start prednisone 10 mg , Follow up 2 weeks 11/23/2021 Dr. Thompson MD (PCP) No Medication Changes note, Recommend referral to neurology,Return in about 1 week.  Recent consult visits:  No recent consult visit  Hospital visits:  Medication Reconciliation was completed by comparing discharge summary, patient's EMR and Pharmacy list, and upon discussion with patient.  Admitted to the hospital on 11/14/2021 due to Tremor . Discharge date was 11/14/2021. Discharged from [facility].    New?Medications Started at Hospital Discharge:?? -started None   Medication Changes at Hospital Discharge: -Changed None  Medications Discontinued at Hospital Discharge: -Stopped None  Medications that remain the same after Hospital Discharge:??  -All other medications will remain the same.    Questions for Clinical Pharmacist:   1.Are you able to connect with Patient? yes     2.Confirmed appointment date/time with patient/caregiver? Confirm appointment on 12/17/2021 at 9:30 am  with Alexandre Fleury CPP    3.Visit  type telephone   4.Patient/Caregiver instructed to bring medications to appointment. Patient is aware to bring all medications and supplements to appointment    5.What, if any, problems do you have getting your medications from the pharmacy?  Financial barriers, patient reports she does have some issues affording some of her medications. Patient states she was taking Tresiba in the past which was working for her, but she thinks it was stop due to cost.Patient states she is unsure how much her Farxgia is going to cost, but she  agreed to start the patient assistance process for her.   6.What is your top health concern to discuss at your upcoming visit?  Patient states she would like to discuss her new medication Farxgia. Patient states she would like to ask if the clinical pharmacist if he has any suggestion regarding to her hand swelling or what she can do to help with the discomfort. Patient states her PCP may restart her Tresiba , and would like to get the clinical pharmacist opinion , and if she does restart Tresiba if she can get patient assistance.   7.Have you seen any other providers since your last visit? No  Medications: Outpatient Encounter Medications as of 12/16/2021  Medication Sig Note   Accu-Chek FastClix Lancets MISC Use to check blood sugar 4 times daily DX code R73.03 and z79.4    amLODipine (NORVASC) 5 MG tablet Take 5 mg by mouth daily.    amLODipine (NORVASC) 5 MG tablet Take 1 tablet by mouth daily.    blood glucose meter kit and supplies Dispense based on patient and insurance preference. Use up to four   times daily as directed. (FOR ICD-10 E10.9, E11.9).    citalopram (CELEXA) 40 MG tablet Take 20 mg by mouth daily.    dapagliflozin propanediol (FARXIGA) 5 MG TABS tablet Take 1 tablet (5 mg total) by mouth daily before breakfast.    docusate sodium (COLACE) 100 MG capsule Take 1 capsule (100 mg total) by mouth 2 (two) times daily.    ferrous sulfate (FERROUSUL) 325 (65  FE) MG tablet Take 1 tablet (325 mg total) by mouth 3 (three) times daily with meals.    gabapentin (NEURONTIN) 600 MG tablet Take 600 mg by mouth 2 (two) times daily.    glucose blood test strip Use as instructed    HYDROcodone-acetaminophen (NORCO) 7.5-325 MG tablet Take 1-2 tablets by mouth every 4 (four) hours as needed for moderate pain or severe pain.    Insulin Degludec (TRESIBA FLEXTOUCH) 200 UNIT/ML SOPN Inject 10 Units into the skin at bedtime. Only if blood sugar is over 120 01/18/2017: Took 4 units ( normal is 10)   Insulin Pen Needle 32G X 4 MM MISC Use with insulin once daily  DX R73.03 and Z79.4    Lancets MISC 1 each by Does not apply route in the morning, at noon, in the evening, and at bedtime.    metFORMIN (GLUCOPHAGE) 500 MG tablet Take 500 mg by mouth 2 (two) times daily.    methocarbamol (ROBAXIN) 500 MG tablet Take 1 tablet (500 mg total) by mouth every 6 (six) hours as needed for muscle spasms.    polyethylene glycol (MIRALAX / GLYCOLAX) packet Take 17 g by mouth 2 (two) times daily.    predniSONE (STERAPRED UNI-PAK 21 TAB) 10 MG (21) TBPK tablet Take as directed    rosuvastatin (CRESTOR) 10 MG tablet Take 1 tablet (10 mg total) by mouth at bedtime.    No facility-administered encounter medications on file as of 12/16/2021.    Care Gaps: COVID-19 Vaccine Foot Exam Ophthalmology Exam Urine Microalbumin Hepatitis C Screening Influenza Vaccine  Star Rating Drugs: Farxiga 5 mg Not on fill report Tresiba 10 units Not on fill report Metformin 500 mg last filled on 10/01/2021 for 90 day supply at [facility]. Rosuvastatin 10 mg last filled on 11/23/2021 for 90 day supply at [facility].  Medication fills gaps: None ID  [place] Pharmacist Assistant [phone]

## 2021-12-17 ENCOUNTER — Ambulatory Visit (INDEPENDENT_AMBULATORY_CARE_PROVIDER_SITE_OTHER): Payer: Medicare Other

## 2021-12-17 DIAGNOSIS — I152 Hypertension secondary to endocrine disorders: Secondary | ICD-10-CM

## 2021-12-17 DIAGNOSIS — E1169 Type 2 diabetes mellitus with other specified complication: Secondary | ICD-10-CM

## 2021-12-17 DIAGNOSIS — E1165 Type 2 diabetes mellitus with hyperglycemia: Secondary | ICD-10-CM

## 2021-12-17 MED ORDER — ROSUVASTATIN CALCIUM 10 MG PO TABS: 10.0000 mg | ORAL_TABLET | Freq: Every day | ORAL | 3 refills | Status: DC

## 2021-12-17 MED ORDER — CITALOPRAM HYDROBROMIDE 40 MG PO TABS: 40.0000 mg | ORAL_TABLET | Freq: Every day | ORAL | 1 refills | Status: DC

## 2021-12-17 MED ORDER — GABAPENTIN 600 MG PO TABS: 600.0000 mg | ORAL_TABLET | Freq: Two times a day (BID) | ORAL | 1 refills | Status: DC

## 2021-12-17 MED ORDER — AMLODIPINE BESYLATE 5 MG PO TABS: 5.0000 mg | ORAL_TABLET | Freq: Every day | ORAL | 1 refills | Status: DC

## 2021-12-17 NOTE — Patient Instructions (Signed)
Visit Information It was great speaking with you today!  Please let me know if you have any questions about our visit.   Goals Addressed             This Visit's Progress    Monitor and Manage My Blood Sugar-Diabetes Type 2       Timeframe:  Long-Range Goal Priority:  High Start Date: 12/17/21                            Expected End Date: 12/18/22                      Follow Up within 30 days   - check blood sugar at prescribed times - check blood sugar if I feel it is too high or too low - take the blood sugar log to all doctor visits    Why is this important?   Checking your blood sugar at home helps to keep it from getting very high or very low.  Writing the results in a diary or log helps the doctor know how to care for you.  Your blood sugar log should have the time, date and the results.  Also, write down the amount of insulin or other medicine that you take.  Other information, like what you ate, exercise done and how you were feeling, will also be helpful.     Notes:         Patient Care Plan: General Pharmacy (Adult)     Problem Identified: Hypertension, Hyperlipidemia, Diabetes, Anxiety, Osteoarthritis, and Restless Legs Syndrome   Priority: High     Long-Range Goal: Patient-Specific Goal   Start Date: 12/17/2021  Expected End Date: 12/18/2022  This Visit's Progress: On track  Priority: High  Note:   Current Barriers:  Unable to independently afford treatment regimen Unable to achieve control of Diabetes   Pharmacist Clinical Goal(s):  Patient will verbalize ability to afford treatment regimen achieve control of diabetes as evidenced by A1c less than 7% through collaboration with PharmD and provider.   Interventions: 1:1 collaboration with [name], MD regarding development and update of comprehensive plan of care as evidenced by provider attestation and co-signature Inter-disciplinary care team collaboration (see longitudinal plan of  care) Comprehensive medication review performed; medication list updated in electronic medical record  Hypertension (BP goal <130/80) -Controlled -Current treatment: Amlodipine 5 mg daily: Appropriate, Effective, Safe, Accessible -Medications previously tried: NA  -Current home readings: Does not monitor routinely at home.  -Denies hypotensive/hypertensive symptoms -Recommended to continue current medication  Hyperlipidemia: (LDL goal < 70) -Controlled -Current treatment: Rosuvastatin 10 mg nightly: Appropriate, Effective, Safe, Accessible  -Medications previously tried: NA  -Recommended to continue current medication  Diabetes (A1c goal <7%) -Uncontrolled -Diagnosed in 30s  -Current medications: Farxiga 5 mg daily: Appropriate, Query Effective -Medications previously tried: Metformin (GI), Evaristo Bury  -Current home glucose readings fasting glucose: 158, typically low 130s,  -Reports hypoglycemia symptoms. "Fuzzy headed" lows in the 90s-100s and will treat with orange juice.  -Educated on Prevention and management of hypoglycemic episodes; discussed utilizing orange juice only if blood sugars are less than 70 and to have a high-protein, low carbohydrate snack when feeling relative hypoglycemia symptoms in the 90-100s.  -Cost barrier with testing supplies, will investigate to see what the issue is.  -INCREASE Farxiga to 10 mg daily once PAP approved.  Anxiety (Goal: Maintain remission of symptoms) -Controlled -Current treatment: Citalopram  40 mg daily: Appropriate, Effective, Query Safe -Medications previously tried/failed: NA -GAD7: NA -Could decrease citalopram to 20 mg daily to minimize risk of Qtc prolongation. Will defer in this patient in light of ongoing arthritis pain and other medication changes.  -Recommended to continue current medication  Chronic Pain (Goal: [place] pain) -Uncontrolled -Osteoarthritis in knees, neuropathy in legs, possible rheumatologic illness in  hands.  -Current treatment  Gabapentin 600 mg twice daily: Appropriate, Effective, Safe, Accessible  Also using for restless legs/anxiety  -Medications previously tried: NA   -Continues to struggle with swelling and stiffness in hands. Referred to rheumatology, but not scheduled until Feb 2024.  -May need another course of prednisone to help with pain and swelling, patient concerned due to effect on blood sugars.  -Recommended to continue current medication  Patient Goals/Self-Care Activities Patient will:  - check glucose daily before breakfast, document, and provide at future appointments check blood pressure weekly, document, and provide at future appointments  Follow Up Plan: Telephone follow up appointment with care management team member scheduled for:  01/21/2022 at 10:00 AM    Kelli Ruiz was given information about Chronic Care Management services today including:  CCM service includes personalized support from designated clinical staff supervised by her physician, including individualized plan of care and coordination with other care providers 24/7 contact phone numbers for assistance for urgent and routine care needs. Standard insurance, coinsurance, copays and deductibles apply for chronic care management only during months in which we provide at least 20 minutes of these services. Most insurances cover these services at 100%, however patients may be responsible for any copay, coinsurance and/or deductible if applicable. This service may help you avoid the need for more expensive face-to-face services. Only one practitioner may furnish and bill the service in a calendar month. The patient may stop CCM services at any time (effective at the end of the month) by phone call to the office staff.  Patient agreed to services and verbal consent obtained.   Patient verbalizes understanding of instructions and care plan provided today and agrees to view in MyChart. Active MyChart status and  patient understanding of how to access instructions and care plan via MyChart confirmed with patient.     [name], PharmD, Patsy [place], CPP Clinical Pharmacist Practitioner  [facility]  [phone]

## 2021-12-17 NOTE — Progress Notes (Signed)
Chronic Care Management Pharmacy Note  12/17/2021 Name:  Kelli Ruiz MRN:  [id number] DOB:  1955-12-12  Summary: Patient presents for initial CCM consult.   -Continues to struggle with swelling and stiffness in hands.   -Reports hypoglycemia symptoms. "Fuzzy headed" lows in the 90s-100s and will treat with orange juice. Discussed utilizing orange juice only if blood sugars are less than 70 and to have a high-protein, low carbohydrate snack when feeling relative hypoglycemia symptoms in the 90-100s.   -Cost barrier with diabetic testing supplies, will investigate to see what the issue is.   Recommendations/Changes made from today's visit: -INCREASE Farxiga to 10 mg daily once PAP approved.  -Recommend checking urine microalbumin, fasting lipid profile at PCP follow-up.    Plan: CPP follow-up 1 month.   Recommended Problem List Changes:  Add: Generalized Anxiety Disorder Primary osteoarthritis of right knee Restless Legs Syndrome  Peripheral Neuropathy  Remove: Obesity   Subjective: Kelli Ruiz is an 66 y.o. year old female who is a primary patient of Bonnita Hollow, MD.  The CCM team was consulted for assistance with disease management and care coordination needs.    Engaged with patient by telephone for initial visit in response to provider referral for pharmacy case management and/or care coordination services.   Consent to Services:  The patient was given the following information about Chronic Care Management services today, agreed to services, and gave verbal consent: 1. CCM service includes personalized support from designated clinical staff supervised by the primary care provider, including individualized plan of care and coordination with other care providers 2. 24/7 contact phone numbers for assistance for urgent and routine care needs. 3. Service will only be billed when office clinical staff spend 20 minutes or more in a month to coordinate care. 4. Only one  practitioner may furnish and bill the service in a calendar month. 5.The patient may stop CCM services at any time (effective at the end of the month) by phone call to the office staff. 6. The patient will be responsible for cost sharing (co-pay) of up to 20% of the service fee (after annual deductible is met). Patient agreed to services and consent obtained.  Patient Care Team: Bonnita Hollow, MD as PCP - General (Family Medicine) Germaine Pomfret, [facility] (Pharmacist)  Recent office visits: 12/14/2021 Dr. Grandville Silos MD (PCP) Trial [name] 5 mg, Referral to CCM, Ambulatory referral to diabetic education, Return in about 4 weeks  11/30/2021 Dr. Grandville Silos MD (PCP) start prednisone 10 mg , Follow up 2 weeks 11/23/2021 Dr. Grandville Silos MD (PCP) No Medication Changes note, Recommend referral to neurology,Return in about 1 week.  Recent consult visits: No recent consult visit  Hospital visits: Admitted to the ED on 11/14/2021 due to Tremor.   Objective:  Lab Results  Component Value Date   CREATININE 0.73 11/14/2021   BUN 12 11/14/2021   GFRNONAA >60 11/14/2021   GFRAA >60 01/19/2017   NA 139 11/14/2021   K 4.5 11/14/2021   CALCIUM 9.9 11/14/2021   CO2 23 11/14/2021   GLUCOSE 165 (H) 11/14/2021    Lab Results  Component Value Date/Time   HGBA1C 7.9 (H) 11/30/2021 11:30 AM   HGBA1C 6.7 (H) 11/29/2016 10:57 AM    Last diabetic Eye exam: No results found for: "HMDIABEYEEXA"  Last diabetic Foot exam: No results found for: "HMDIABFOOTEX"   No results found for: "CHOL", "HDL", "LDLCALC", "LDLDIRECT", "TRIG", "CHOLHDL"     Latest Ref Rng & Units 11/14/2021  11:49 AM  Hepatic Function  Total Protein 6.5 - 8.1 g/dL 8.0   Albumin 3.5 - 5.0 g/dL 4.2   AST 15 - 41 U/L 28   ALT 0 - 44 U/L 25   Alk Phosphatase 38 - 126 U/L 60   Total Bilirubin 0.3 - 1.2 mg/dL 0.9     No results found for: "TSH", "FREET4"     Latest Ref Rng & Units 11/14/2021   11:49 AM 01/19/2017    4:53 AM 01/13/2017     2:59 PM  CBC  WBC 4.0 - 10.5 K/uL 7.6  14.6  9.3   Hemoglobin 12.0 - 15.0 g/dL 14.8  12.7  15.4   Hematocrit 36.0 - 46.0 % 44.0  38.0  45.2   Platelets 150 - 400 K/uL 273  189  183     No results found for: "VD25OH"  Clinical ASCVD: No  The ASCVD Risk score (Arnett DK, et al., 2019) failed to calculate for the following reasons:   Cannot find a previous HDL lab       11/23/2021    2:15 PM  Depression screen PHQ 2/9  Decreased Interest 0  Down, Depressed, Hopeless 0  PHQ - 2 Score 0  Altered sleeping 0  Tired, decreased energy 0  Change in appetite 0  Feeling bad or failure about yourself  0  Trouble concentrating 0  Moving slowly or fidgety/restless 0  Suicidal thoughts 0  PHQ-9 Score 0  Difficult doing work/chores Not difficult at all    Social History   Tobacco Use  Smoking Status Former   Years: 20.00   Types: Cigarettes   Quit date: 01/09/2017   Years since quitting: 4.9  Smokeless Tobacco Never  Tobacco Comments   Pt uses vapor   BP Readings from Last 3 Encounters:  12/14/21 124/78  11/30/21 138/84  11/23/21 136/84   Pulse Readings from Last 3 Encounters:  12/14/21 72  11/30/21 83  11/23/21 89   Wt Readings from Last 3 Encounters:  12/14/21 163 lb (73.9 kg)  11/30/21 164 lb 3.2 oz (74.5 kg)  11/23/21 163 lb (73.9 kg)   BMI Readings from Last 3 Encounters:  12/14/21 28.42 kg/m  11/30/21 28.63 kg/m  11/23/21 28.42 kg/m    Assessment/Interventions: Review of patient past medical history, allergies, medications, health status, including review of consultants reports, laboratory and other test data, was performed as part of comprehensive evaluation and provision of chronic care management services.   SDOH:  (Social Determinants of Health) assessments and interventions performed: Yes SDOH Interventions    Flowsheet Row Most Recent Value  SDOH Interventions   Financial Strain Interventions Other (Comment)  [PAP]  Transportation Interventions  Intervention Not Indicated      SDOH Screenings   Alcohol Screen: Not on file  Depression (PHQ2-9): Low Risk  (11/23/2021)   Depression (PHQ2-9)    PHQ-2 Score: 0  Financial Resource Strain: High Risk (12/17/2021)   Overall Financial Resource Strain (CARDIA)    Difficulty of Paying Living Expenses: Hard  Food Insecurity: Not on file  Housing: Not on file  Physical Activity: Not on file  Social Connections: Not on file  Stress: Not on file  Tobacco Use: Medium Risk (12/14/2021)   Patient History    Smoking Tobacco Use: Former    Smokeless Tobacco Use: Never    Passive Exposure: Not on file  Transportation Needs: No Transportation Needs (12/17/2021)   PRAPARE - Hydrologist (Medical): No  Lack of Transportation (Non-Medical): No    CCM Care Plan  No Known Allergies  Medications Reviewed Today     Reviewed by Germaine Pomfret, [facility] (Pharmacist) on 12/17/21 at 7  Med List Status: <None>   Medication Order Taking? Sig Documenting Provider Last Dose Status Informant  Accu-Chek FastClix Lancets MISC [id number]  Use to check blood sugar 4 times daily DX code R73.03 and z79.4 [provider]  Active   amLODipine (NORVASC) 5 MG tablet [id number] Yes Take 1 tablet by mouth daily. [provider] Taking Active   blood glucose meter kit and supplies [id number]  Dispense based on patient and insurance preference. Use up to four times daily as directed. (FOR ICD-10 E10.9, E11.9). Bonnita Hollow, MD  Active   citalopram (CELEXA) 40 MG tablet [id number] Yes Take 40 mg by mouth daily. [provider] Taking Active Self  dapagliflozin propanediol (FARXIGA) 5 MG TABS tablet [id number] Yes Take 1 tablet (5 mg total) by mouth daily before breakfast. Bonnita Hollow, MD Taking Active   fluticasone [facility]) 50 MCG/ACT nasal spray [id number] Yes Place 2 sprays into both nostrils daily as needed for allergies or rhinitis. [provider] Taking Active   gabapentin (NEURONTIN) 600 MG tablet [id number] Yes Take 600 mg by mouth 2 (two) times daily. [provider] Taking Active   glucose blood test strip [id number]  Use as instructed Bonnita Hollow, MD  Active   ibuprofen (ADVIL) 200 MG tablet [id number] Yes Take 200 mg by mouth every 6 (six) hours as needed for mild pain or headache. [provider] Taking Active   Insulin Pen Needle 32G X 4 MM MISC [id number]  Use with insulin once daily  DX R73.03 and Z79.4 [provider]  Active   Lancets [place] [id number]  1 each by Does not apply route in the morning, at noon, in the evening, and at bedtime. Bonnita Hollow, MD  Active   rosuvastatin (CRESTOR) 10 MG tablet [id number] Yes Take 1 tablet (10 mg total) by mouth at bedtime. Bonnita Hollow, MD Taking Active   Turmeric 500 MG CAPS [id number] Yes Take 500 mg by mouth daily. [provider] Taking Active             Patient Active Problem List   Diagnosis Date Noted   Type 2 diabetes mellitus with hyperglycemia ([place]) 11/23/2021   Hypertension associated with diabetes ([place]) 11/23/2021   Hyperlipidemia associated with type 2 diabetes mellitus ([place]) 11/23/2021   Tremor 11/23/2021   OSA (obstructive sleep apnea) 11/23/2021   Swelling of both hands 11/23/2021   Obese 01/19/2017   S/P conversion right knee 01/18/2017   There is no immunization history on file for this patient.  Conditions to be addressed/monitored:  Hypertension, Hyperlipidemia, Diabetes, Anxiety, Osteoarthritis, and Restless Legs Syndrome   Care Plan : General Pharmacy (Adult)  Updates made by Germaine Pomfret, RPH since 12/17/2021 12:00 AM     Problem: Hypertension, Hyperlipidemia, Diabetes, Anxiety, Osteoarthritis, and Restless Legs Syndrome   Priority: High     Long-Range Goal: Patient-Specific Goal   Start Date: 12/17/2021  Expected End Date: 12/18/2022  This Visit's Progress: On track  Priority: High  Note:    Current Barriers:  Unable to independently afford treatment regimen Unable to achieve control of Diabetes   Pharmacist Clinical Goal(s):  Patient will verbalize ability to afford treatment regimen achieve control of diabetes as evidenced by A1c less than 7% through collaboration with PharmD and  provider.   Interventions: 1:1 collaboration with Bonnita Hollow, MD regarding development and update of comprehensive plan of care as evidenced by provider attestation and co-signature Inter-disciplinary care team collaboration (see longitudinal plan of care) Comprehensive medication review performed; medication list updated in electronic medical record  Hypertension (BP goal <130/80) -Controlled -Current treatment: Amlodipine 5 mg daily: Appropriate, Effective, Safe, Accessible -Medications previously tried: NA  -Current home readings: Does not monitor routinely at home.  -Denies hypotensive/hypertensive symptoms -Recommended to continue current medication  Hyperlipidemia: (LDL goal < 70) -Controlled -Current treatment: Rosuvastatin 10 mg nightly: Appropriate, Effective, Safe, Accessible  -Medications previously tried: NA  -Recommended to continue current medication  Diabetes (A1c goal <7%) -Uncontrolled -Diagnosed in 30s  -Current medications: Farxiga 5 mg daily: Appropriate, Query Effective -Medications previously tried: Metformin (GI), Tyler Aas  -Current home glucose readings fasting glucose: 158, typically low 130s,  -Reports hypoglycemia symptoms. "Fuzzy headed" lows in the 90s-100s and will treat with orange juice.  -Educated on Prevention and management of hypoglycemic episodes; discussed utilizing orange juice only if blood sugars are less than 70 and to have a high-protein, low carbohydrate snack when feeling relative hypoglycemia symptoms in the 90-100s.  -Cost barrier with testing supplies, will investigate to see what the issue is.  -INCREASE Farxiga to 10 mg daily  once PAP approved.  Anxiety (Goal: Maintain remission of symptoms) -Controlled -Current treatment: Citalopram 40 mg daily: Appropriate, Effective, Query Safe -Medications previously tried/failed: NA -GAD7: NA -Could decrease citalopram to 20 mg daily to minimize risk of Qtc prolongation. Will defer in this patient in light of ongoing arthritis pain and other medication changes.  -Recommended to continue current medication  Chronic Pain (Goal: [place] pain) -Uncontrolled -Osteoarthritis in knees, neuropathy in legs, possible rheumatologic illness in hands.  -Current treatment  Gabapentin 600 mg twice daily: Appropriate, Effective, Safe, Accessible  Also using for restless legs/anxiety  -Medications previously tried: NA   -Continues to struggle with swelling and stiffness in hands. Referred to rheumatology, but not scheduled until Feb 2024.  -May need another course of prednisone to help with pain and swelling, patient concerned due to effect on blood sugars.  -Recommended to continue current medication  Patient Goals/Self-Care Activities Patient will:  - check glucose daily before breakfast, document, and provide at future appointments check blood pressure weekly, document, and provide at future appointments  Follow Up Plan: Telephone follow up appointment with care management team member scheduled for:  01/21/2022 at 10:00 AM      Medication Assistance: Application for Farxiga  medication assistance program. in process.  Anticipated assistance start date TBD.  See plan of care for additional detail.  Compliance/Adherence/Medication fill history: Care Gaps: COVID-19 Vaccine Foot Exam Ophthalmology Exam Urine Microalbumin Hepatitis C Screening Influenza Vaccine  Star-Rating Drugs: Metformin 500 mg last filled on 10/01/2021 for 90 day supply at [facility]. Rosuvastatin 10 mg last filled on 11/23/2021 for 90 day supply at [facility].  Patient's preferred  pharmacy is:  [name] - [place], [place] - [address] [place] [address] Phone: [phone] Fax: [phone]  Uses pill box? No Pt endorses 1000% compliance  We discussed: Verbal consent obtained for [name] enhanced pharmacy services (medication synchronization, adherence packaging, delivery coordination). A medication sync plan was created to allow patient to get all medications delivered once every 30 to 90 days per patient preference. Patient understands they have freedom to choose pharmacy and clinical pharmacist will coordinate care  between all prescribers and [name]. Patient decided to: Utilize [name] for medication synchronization, packaging and delivery  Care Plan and Follow Up Patient Decision:  Patient agrees to Care Plan and Follow-up.  Plan: Telephone follow up appointment with care management team member scheduled for:  01/21/2022 at 10:00 AM  [name], PharmD, [place], CPP Clinical Pharmacist Practitioner  [facility]  [phone]

## 2021-12-21 ENCOUNTER — Telehealth: Payer: Self-pay

## 2021-12-21 DIAGNOSIS — Z794 Long term (current) use of insulin: Secondary | ICD-10-CM

## 2021-12-21 NOTE — Progress Notes (Signed)
Chronic Care Management Ruiz Assistant   Name: Kelli Ruiz  MRN: [id number] DOB: 02-Jul-1955  Reason for Encounter: Medication Review/Patient assistance application for Kelli Ruiz, on boarding with Kelli Ruiz Ruiz.    Recent office visits:  None ID  Recent consult visits:  None ID  Hospital visits:  None in previous 6 months  Medications: Outpatient Encounter Medications as of 12/21/2021  Medication Sig   Accu-Chek FastClix Lancets MISC Use to check blood sugar 4 times daily DX code R73.03 and z79.4   amLODipine (NORVASC) 5 MG tablet Take 1 tablet (5 mg total) by mouth daily.   blood glucose meter kit and supplies Dispense based on patient and insurance preference. Use up to four times daily as directed. (FOR ICD-10 E10.9, E11.9).   citalopram (CELEXA) 40 MG tablet Take 1 tablet (40 mg total) by mouth daily.   dapagliflozin propanediol (FARXIGA) 5 MG TABS tablet Take 1 tablet (5 mg total) by mouth daily before breakfast.   fluticasone (FLONASE) 50 MCG/ACT nasal spray Place 2 sprays into both nostrils daily as needed for allergies or rhinitis.   gabapentin (NEURONTIN) 600 MG tablet Take 1 tablet (600 mg total) by mouth 2 (two) times daily.   glucose blood test strip Use as instructed   ibuprofen (ADVIL) 200 MG tablet Take 200 mg by mouth every 6 (six) hours as needed for mild pain or headache.   Insulin Pen Needle 32G X 4 MM MISC Use with insulin once daily  DX R73.03 and Z79.4   Lancets MISC 1 each by Does not apply route in the morning, at noon, in the evening, and at bedtime.   rosuvastatin (CRESTOR) 10 MG tablet Take 1 tablet (10 mg total) by mouth at bedtime.   Turmeric 500 MG CAPS Take 500 mg by mouth daily.   No facility-administered encounter medications on file as of 12/21/2021.   Care Gaps: COVID-19 Vaccine Foot Exam Urine Microalbumin Hepatitis C Screening Influenza Vaccine   Star Rating Drugs: Farxiga 5 mg Not on fill report Tresiba 10 units Not on fill  report Metformin 500 mg last filled on 10/01/2021 for 90 day supply at [name]. Rosuvastatin 10 mg last filled on 11/23/2021 for 90 day supply at [name].   Medication fills gaps: None ID  Patient assistance Application: I received a task from Kelli Ruiz, CPP requesting that I start the application for patient assistance on the medication Farxiga.    Patient gave verbal consent to complete the online application for Kelli Ruiz via telephone.  Patient was approved with a start date of 12/21/2021 and with a end date of 12/31//2023.Notified Clinical pharmacist to send prescription to Kelli Ruiz as I will check in within one week to make sure they have received it and submitted it.   Onboarding Form:  I completed on board form for patient to utilize Kelli Ruiz Ruiz for medication synchronization, packaging and delivery. Form was sent to clinical pharmacist to review, and to submit to Kelli Ruiz Ruiz for processing.  I reach out to Kelli Ruiz to request a profile transfer on 12/21/2021 to go to Kelli Ruiz Ruiz.  Per Kelli Ruiz, profile transfer was sent while on the phone with me.  Financial Cost issue:  Per Clinical pharmacist, Patient reports significant financial cost for test strips and lancets. Can you please call Kelli Ruiz/Kelli Ruiz to find out if there is another brand that would be preferred or if the prescription needs to be changed.  I reach out to Kelli Ruiz as requested from the  Clinical pharmacist. Per Kelli Ruiz, patient needs to reach out to them as they are not allowed to give me information since I am not the member. Per Kelli Ruiz, patient needs to reach out to her insurance company.  Kelli Ruiz Pharmacist Assistant [phone]

## 2021-12-24 ENCOUNTER — Telehealth: Payer: Self-pay

## 2021-12-24 NOTE — Telephone Encounter (Signed)
Contacted patient regarding fax we received from [name]. Patient stated it was submitted at her last visit and she was approved for the assistance.   She also mentioned as an FYI her blood sugars have been running in the range of 100-120. She stated the diet and lifestyle change has helped.

## 2021-12-28 MED ORDER — DAPAGLIFLOZIN PROPANEDIOL 10 MG PO TABS: 10.0000 mg | ORAL_TABLET | Freq: Every day | ORAL | 3 refills | Status: DC

## 2021-12-28 NOTE — Addendum Note (Signed)
Addended by: [name] A on: 12/28/2021 02:53 PM   Modules accepted: Orders

## 2022-01-05 ENCOUNTER — Telehealth: Payer: Self-pay

## 2022-01-05 DIAGNOSIS — Z794 Long term (current) use of insulin: Secondary | ICD-10-CM

## 2022-01-05 MED ORDER — DAPAGLIFLOZIN PROPANEDIOL 10 MG PO TABS: 10.0000 mg | ORAL_TABLET | Freq: Every day | ORAL | 3 refills | Status: DC

## 2022-01-05 NOTE — Progress Notes (Signed)
    Chronic Care Management Pharmacy Assistant   Name: Kelli Ruiz  MRN: [id number] DOB: 02-21-1956  Reason for Encounter: Medication Review/Patient assistance update on Farxiga.    I reach out to [name] to confirm they have received the prescription and submitted it. Per [name], they have not receive any prescription for Farxiga.I Notified clinical pharmacist to send the prescription.  Everlean Cherry Clinical Pharmacist Assistant [phone]

## 2022-01-07 ENCOUNTER — Telehealth: Payer: Self-pay

## 2022-01-07 NOTE — Progress Notes (Signed)
Patient return my call and inform me she reach out to her Insurance to ask about if there is another brand that would be preferred or if the prescription needs to be changed.To help with lowering the cost with her test strips and lancets.Patient states they inform her she could do a three way call with me to make sure all questions are answer.  Patient and I did a three way call and reach out to [facility] to determine what would be helpful to lower her cost with her test strips and lancets.After several attempts of trying to speak with someone [facility] continue to transfer our call with no success.I inform patient I will reach out again tomorrow 01/08/2022.  01/11/2022:  I reach out to [facility] again which they inform me they would need to speak with the patient.  Patient states she had a visit with her PCP and he inform her he was going to reach out to the clinical pharmacist to see about getting her started with a [place] deceive.   [name] I reach out to [name] to confirm they have received and submitted patient prescription for Farxiga.   Per [name], they have receive patient prescription and will be shipped in 10-14 business days.  Patient Verbalized understanding. Everlean Cherry Clinical Pharmacist Assistant [phone]

## 2022-01-11 ENCOUNTER — Encounter: Payer: Self-pay | Admitting: Family Medicine

## 2022-01-11 ENCOUNTER — Ambulatory Visit (INDEPENDENT_AMBULATORY_CARE_PROVIDER_SITE_OTHER): Payer: Medicare Other | Admitting: Family Medicine

## 2022-01-11 VITALS — BP 136/82 | HR 92 | Temp 97.7°F | Wt 161.4 lb

## 2022-01-11 DIAGNOSIS — G4733 Obstructive sleep apnea (adult) (pediatric): Secondary | ICD-10-CM | POA: Diagnosis not present

## 2022-01-11 DIAGNOSIS — R251 Tremor, unspecified: Secondary | ICD-10-CM

## 2022-01-11 DIAGNOSIS — M7989 Other specified soft tissue disorders: Secondary | ICD-10-CM

## 2022-01-11 DIAGNOSIS — E1165 Type 2 diabetes mellitus with hyperglycemia: Secondary | ICD-10-CM

## 2022-01-11 NOTE — Progress Notes (Signed)
Assessment/Plan:   Problem List Items Addressed This Visit       Respiratory   OSA (obstructive sleep apnea) - Primary   Relevant Orders   Ambulatory referral to Sleep Studies     Endocrine   Type 2 diabetes mellitus with hyperglycemia ([place])    Improved Patient did stop taking [name], encourage patient to restart and discussed benefits of medication Patient acknowledges that she would do this Continue working on diet and exercise Follow-up in 3 months        Other   Tremor    Mild, subjectively improved      Swelling of both hands    Improved 11/2021 lab work positive for ESR 39.  Remainder of work-up negative for rheumatoid, negative ANA, negative CRP, negative uric acid  Reports x-rays of hands recently were negative for OA from outside provider Upcoming rheumatology appointment pending            Subjective:  HPI:  Everlie A Towson is a 66 y.o. female who has S/P conversion right knee; Type 2 diabetes mellitus with hyperglycemia ([place]); Hypertension associated with diabetes ([place]); Hyperlipidemia associated with type 2 diabetes mellitus ([place]); Tremor; OSA (obstructive sleep apnea); and Swelling of both hands on their problem list..   She  has a past medical history of Arthritis, Complication of anesthesia, Depression, Diabetes mellitus without complication ([place]), Hypertension, Obese (01/19/2017), RLS (restless legs syndrome), and Sleep apnea..   She presents with chief complaint of Follow-up (4 week follow up. Spot of concern on left ankle. Referral to dermatology. ) .   DIABETES Type II, established problem, Medications: None,  Blood Sugars per patient: Fasting: In the 140s Diet-regular Mediterranean diet Regular Exercise-walks dog regularly Interim History- says she recently stopped taking farxiga due to "anxiety, of side effects"   ROS: Denies Polyuria,Polydipsia, declines hypoglycemia symptoms headache, drowsiness, etc.   Bilateral hand pain swelling.   Associated tremor.  Reports improvement in swelling, tremor and pain, and pain in hands.   Sleep Apnea: Patient presents with possible obstructive sleep apnea. Patent has a several years history of symptoms, improved on CPAP.  Denies any daytime fatigue or apnea.  Reports that she has had a malfunction in her CPAP machine recently, and would like a new one, was informed by her insurance that she would need a repeat sleep study.    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 90 tablet 1   blood glucose meter kit and supplies Dispense based on patient and insurance preference. Use up to four times daily as directed. (FOR ICD-10 E10.9, E11.9). 1 each 0   citalopram (CELEXA) 40 MG tablet Take 1 tablet (40 mg total) by mouth daily. 90 tablet 1   dapagliflozin propanediol (FARXIGA) 10 MG TABS tablet Take 1 tablet (10 mg  total) by mouth daily before breakfast. Patient receives via [name] Patient Assistance 90 tablet 3   fluticasone (FLONASE) 50 MCG/ACT nasal spray Place 2 sprays into both nostrils daily as needed for allergies or rhinitis.     gabapentin (NEURONTIN) 600 MG tablet Take 1 tablet (600 mg total) by mouth 2 (two) times daily. 180 tablet 1   glucose blood test strip Use as instructed 100 each 12   ibuprofen (ADVIL) 200 MG tablet Take 200 mg by mouth every  6 (six) hours as needed for mild pain or headache.     Insulin Pen Needle 32G X 4 MM MISC Use with insulin once daily  DX R73.03 and Z79.4     Lancets MISC 1 each by Does not apply route in the morning, at noon, in the evening, and at bedtime. 100 each 12   rosuvastatin (CRESTOR) 10 MG tablet Take 1 tablet (10 mg total) by mouth at bedtime. 90 tablet 3   Turmeric 500 MG CAPS Take 500 mg by mouth daily.     No facility-administered medications prior to visit.    No family history on file.  Social History   Socioeconomic History   Marital status: Single    Spouse name: Not on file   Number of children: Not on file   Years of education: Not on file   Highest education level: Not on file  Occupational History   Not on file  Tobacco Use   Smoking status: Former    Years: 20.00    Types: Cigarettes    Quit date: 01/09/2017    Years since quitting: 5.0   Smokeless tobacco: Never   Tobacco comments:    Pt uses vapor  Vaping Use   Vaping Use: Some days  Substance and Sexual Activity   Alcohol use: No   Drug use: No   Sexual activity: Not on file  Other Topics Concern   Not on file  Social History Narrative   Not on file   Social Determinants of Health   Financial Resource Strain: High Risk (12/17/2021)   Overall Financial Resource Strain (CARDIA)    Difficulty of Paying Living Expenses: Hard  Food Insecurity: Not on file  Transportation Needs: No Transportation Needs (12/17/2021)   PRAPARE - Hydrologist (Medical): No    Lack of Transportation (Non-Medical): No  Physical Activity: Not on file  Stress: Not on file  Social Connections: Not on file  Intimate Partner Violence: Not on file                                                                                                 Objective:  Physical Exam: BP 136/82 (BP Location: Left Arm, Patient Position: Sitting, Cuff Size: Large)   Pulse 92   Temp 97.7 F (36.5 C) (Tympanic)   Wt 161  lb 6.4 oz (73.2 kg)   SpO2 97%   BMI 28.14 kg/m    Gen: NAD, resting comfortably CV: RRR with no murmurs appreciated Pulm: NWOB, CTAB with no crackles, wheezes, or rhonchi GI: Normal bowel sounds present.  Soft, Nontender, Nondistended. MSK: Bilateral tender and swollen hands difficulty with grip due to pain, although swelling seems improved, mild tremor in bilateral hands Skin: warm, dry Neuro: grossly normal, moves all extremities Psych: Normal affect and thought content         Alesia Banda, MD, MS

## 2022-01-11 NOTE — Patient Instructions (Signed)
We are referring for a sleep study.  We will reach out to [facility] to see if we can get continuous glucose monitoring.  Please continue to take [place].  Please follow up in 3 months for Diabetes, or sooner if you are having issues.

## 2022-01-11 NOTE — Assessment & Plan Note (Signed)
Improved 11/2021 lab work positive for ESR 39.  Remainder of work-up negative for rheumatoid, negative ANA, negative CRP, negative uric acid  Reports x-rays of hands recently were negative for OA from outside provider Upcoming rheumatology appointment pending

## 2022-01-11 NOTE — Assessment & Plan Note (Signed)
Mild, subjectively improved

## 2022-01-11 NOTE — Assessment & Plan Note (Signed)
Improved Patient did stop taking Marcelline Deist, encourage patient to restart and discussed benefits of medication Patient acknowledges that she would do this Continue working on diet and exercise Follow-up in 3 months

## 2022-01-14 DIAGNOSIS — E785 Hyperlipidemia, unspecified: Secondary | ICD-10-CM

## 2022-01-14 DIAGNOSIS — Z7984 Long term (current) use of oral hypoglycemic drugs: Secondary | ICD-10-CM

## 2022-01-14 DIAGNOSIS — I10 Essential (primary) hypertension: Secondary | ICD-10-CM

## 2022-01-14 DIAGNOSIS — E1159 Type 2 diabetes mellitus with other circulatory complications: Secondary | ICD-10-CM | POA: Diagnosis not present

## 2022-01-20 ENCOUNTER — Telehealth: Payer: Self-pay

## 2022-01-20 NOTE — Progress Notes (Signed)
Chronic Care Management APPOINTMENT REMINDER   Kelli Ruiz was reminded to have all medications, supplements and any blood glucose and blood pressure readings available for review with [name], Pharm. D, at her telephone visit on 01/21/2022 at 10:00 am.  Patient Confirm Appointment.  Everlean Cherry Clinical Pharmacist Assistant [phone]

## 2022-01-21 ENCOUNTER — Telehealth: Payer: Medicare Other

## 2022-01-21 ENCOUNTER — Telehealth: Payer: PRIVATE HEALTH INSURANCE

## 2022-01-21 ENCOUNTER — Ambulatory Visit (INDEPENDENT_AMBULATORY_CARE_PROVIDER_SITE_OTHER): Payer: Medicare Other

## 2022-01-21 DIAGNOSIS — E1159 Type 2 diabetes mellitus with other circulatory complications: Secondary | ICD-10-CM

## 2022-01-21 DIAGNOSIS — E1165 Type 2 diabetes mellitus with hyperglycemia: Secondary | ICD-10-CM

## 2022-01-21 NOTE — Progress Notes (Incomplete)
Chronic Care Management Pharmacy Note  01/21/2022 Name:  Kelli Ruiz MRN:  [id number] DOB:  1955-10-18  Summary: Patient presents for CCM follow-up.   Recommendations/Changes made from today's visit:   Plan: CPP follow-up 1 month.   Recommended Problem List Changes:  Add: Generalized Anxiety Disorder Primary osteoarthritis of right knee Restless Legs Syndrome  Peripheral Neuropathy  Remove: Obesity   Subjective: Kelli Ruiz is an 66 y.o. year old female who is a primary patient of Bonnita Hollow, MD.  The CCM team was consulted for assistance with disease management and care coordination needs.    Engaged with patient by telephone for initial visit in response to provider referral for pharmacy case management and/or care coordination services.   Consent to Services:  The patient was given the following information about Chronic Care Management services today, agreed to services, and gave verbal consent: 1. CCM service includes personalized support from designated clinical staff supervised by the primary care provider, including individualized plan of care and coordination with other care providers 2. 24/7 contact phone numbers for assistance for urgent and routine care needs. 3. Service will only be billed when office clinical staff spend 20 minutes or more in a month to coordinate care. 4. Only one practitioner may furnish and bill the service in a calendar month. 5.The patient may stop CCM services at any time (effective at the end of the month) by phone call to the office staff. 6. The patient will be responsible for cost sharing (co-pay) of up to 20% of the service fee (after annual deductible is met). Patient agreed to services and consent obtained.  Patient Care Team: Bonnita Hollow, MD as PCP - General (Family Medicine) Germaine Pomfret, [facility] (Pharmacist)  Recent office visits: 12/14/2021 Dr. Grandville Silos MD (PCP) Trial [name] 5 mg, Referral to CCM, Ambulatory  referral to diabetic education, Return in about 4 weeks  11/30/2021 Dr. Grandville Silos MD (PCP) start prednisone 10 mg , Follow up 2 weeks 11/23/2021 Dr. Grandville Silos MD (PCP) No Medication Changes note, Recommend referral to neurology,Return in about 1 week.  Recent consult visits: No recent consult visit  Hospital visits: Admitted to the ED on 11/14/2021 due to Tremor.   Objective:  Lab Results  Component Value Date   CREATININE 0.73 11/14/2021   BUN 12 11/14/2021   GFRNONAA >60 11/14/2021   GFRAA >60 01/19/2017   NA 139 11/14/2021   K 4.5 11/14/2021   CALCIUM 9.9 11/14/2021   CO2 23 11/14/2021   GLUCOSE 165 (H) 11/14/2021    Lab Results  Component Value Date/Time   HGBA1C 7.9 (H) 11/30/2021 11:30 AM   HGBA1C 6.7 (H) 11/29/2016 10:57 AM    Last diabetic Eye exam:  Lab Results  Component Value Date/Time   HMDIABEYEEXA No Retinopathy 12/14/2021 10:18 AM    Last diabetic Foot exam: No results found for: "HMDIABFOOTEX"   No results found for: "CHOL", "HDL", "LDLCALC", "LDLDIRECT", "TRIG", "CHOLHDL"     Latest Ref Rng & Units 11/14/2021   11:49 AM  Hepatic Function  Total Protein 6.5 - 8.1 g/dL 8.0   Albumin 3.5 - 5.0 g/dL 4.2   AST 15 - 41 U/L 28   ALT 0 - 44 U/L 25   Alk Phosphatase 38 - 126 U/L 60   Total Bilirubin 0.3 - 1.2 mg/dL 0.9     No results found for: "TSH", "FREET4"     Latest Ref Rng & Units 11/14/2021   11:49 AM 01/19/2017  4:53 AM 01/13/2017    2:59 PM  CBC  WBC 4.0 - 10.5 K/uL 7.6  14.6  9.3   Hemoglobin 12.0 - 15.0 g/dL 14.8  12.7  15.4   Hematocrit 36.0 - 46.0 % 44.0  38.0  45.2   Platelets 150 - 400 K/uL 273  189  183     No results found for: "VD25OH"  Clinical ASCVD: No  The ASCVD Risk score (Arnett DK, et al., 2019) failed to calculate for the following reasons:   Cannot find a previous HDL lab       11/23/2021    2:15 PM  Depression screen PHQ 2/9  Decreased Interest 0  Down, Depressed, Hopeless 0  PHQ - 2 Score 0  Altered sleeping 0   Tired, decreased energy 0  Change in appetite 0  Feeling bad or failure about yourself  0  Trouble concentrating 0  Moving slowly or fidgety/restless 0  Suicidal thoughts 0  PHQ-9 Score 0  Difficult doing work/chores Not difficult at all    Social History   Tobacco Use  Smoking Status Former   Years: 20.00   Types: Cigarettes   Quit date: 01/09/2017   Years since quitting: 5.0  Smokeless Tobacco Never  Tobacco Comments   Pt uses vapor   BP Readings from Last 3 Encounters:  01/11/22 136/82  12/14/21 124/78  11/30/21 138/84   Pulse Readings from Last 3 Encounters:  01/11/22 92  12/14/21 72  11/30/21 83   Wt Readings from Last 3 Encounters:  01/11/22 161 lb 6.4 oz (73.2 kg)  12/14/21 163 lb (73.9 kg)  11/30/21 164 lb 3.2 oz (74.5 kg)   BMI Readings from Last 3 Encounters:  01/11/22 28.14 kg/m  12/14/21 28.42 kg/m  11/30/21 28.63 kg/m    Assessment/Interventions: Review of patient past medical history, allergies, medications, health status, including review of consultants reports, laboratory and other test data, was performed as part of comprehensive evaluation and provision of chronic care management services.   SDOH:  (Social Determinants of Health) assessments and interventions performed: Yes SDOH Interventions    Flowsheet Row Chronic Care Management from 12/17/2021 in [place]  SDOH Interventions   Transportation Interventions Intervention Not Indicated  Financial Strain Interventions Other (Comment)  [PAP]      SDOH Screenings   Transportation Needs: No Transportation Needs (12/17/2021)  Depression (PHQ2-9): Low Risk  (11/23/2021)  Financial Resource Strain: High Risk (12/17/2021)  Tobacco Use: Medium Risk (01/11/2022)    CCM Care Plan  No Known Allergies  Medications Reviewed Today     Reviewed by Renaee Munda, CMA (Certified Medical Assistant) on 01/11/22 at [place] List Status: <None>   Medication Order Taking? Sig  Documenting Provider Last Dose Status Informant  Accu-Chek FastClix Lancets MISC [id number] Yes Use to check blood sugar 4 times daily DX code R73.03 and z79.4 [provider] Taking Active   amLODipine (NORVASC) 5 MG tablet [id number] Yes Take 1 tablet (5 mg total) by mouth daily. Bonnita Hollow, MD Taking Active   blood glucose meter kit and supplies [id number] Yes Dispense based on patient and insurance preference. Use up to four times daily as directed. (FOR ICD-10 E10.9, E11.9). Bonnita Hollow, MD Taking Active   citalopram (CELEXA) 40 MG tablet [id number] Yes Take 1 tablet (40 mg total) by mouth daily. Bonnita Hollow, MD Taking Active   dapagliflozin propanediol (FARXIGA) 10 MG TABS tablet [id number] Yes Take 1 tablet (10 mg total)  by mouth daily before breakfast. Patient receives via [name] Patient Assistance Bonnita Hollow, MD Taking Active   fluticasone [facility]) 50 MCG/ACT nasal spray [id number] Yes Place 2 sprays into both nostrils daily as needed for allergies or rhinitis. [provider] Taking Active   gabapentin (NEURONTIN) 600 MG tablet [id number] Yes Take 1 tablet (600 mg total) by mouth 2 (two) times daily. Bonnita Hollow, MD Taking Active   glucose blood test strip [id number] Yes Use as instructed Bonnita Hollow, MD Taking Active   ibuprofen (ADVIL) 200 MG tablet [id number] Yes Take 200 mg by mouth every 6 (six) hours as needed for mild pain or headache. [provider] Taking Active   Insulin Pen Needle 32G X 4 MM MISC [id number] Yes Use with insulin once daily  DX R73.03 and Z79.4 [provider] Taking Active   Lancets [place] [id number] Yes 1 each by Does not apply route in the morning, at noon, in the evening, and at bedtime. Bonnita Hollow, MD Taking Active   rosuvastatin (CRESTOR) 10 MG tablet [id number] Yes Take 1 tablet (10 mg total) by mouth at bedtime. Bonnita Hollow, MD Taking Active   Turmeric 500 MG CAPS [id number] Yes Take 500  mg by mouth daily. [provider] Taking Active             Patient Active Problem List   Diagnosis Date Noted   Type 2 diabetes mellitus with hyperglycemia ([place]) 11/23/2021   Hypertension associated with diabetes ([place]) 11/23/2021   Hyperlipidemia associated with type 2 diabetes mellitus ([place]) 11/23/2021   Tremor 11/23/2021   OSA (obstructive sleep apnea) 11/23/2021   Swelling of both hands 11/23/2021   S/P conversion right knee 01/18/2017   There is no immunization history on file for this patient.  Conditions to be addressed/monitored:  Hypertension, Hyperlipidemia, Diabetes, Anxiety, Osteoarthritis, and Restless Legs Syndrome   There are no care plans that you recently modified to display for this patient.     Medication Assistance: Application for Farxiga  medication assistance program. in process.  Anticipated assistance start date TBD.  See plan of care for additional detail.  Compliance/Adherence/Medication fill history: Care Gaps: COVID-19 Vaccine Foot Exam Ophthalmology Exam Urine Microalbumin Hepatitis C Screening Influenza Vaccine  Star-Rating Drugs: Metformin 500 mg last filled on 10/01/2021 for 90 day supply at [facility]. Rosuvastatin 10 mg last filled on 11/23/2021 for 90 day supply at [facility].  Patient's preferred pharmacy is:  [name] - [place], [place] - [address] [place] [address] Phone: [phone] Fax: [place], [place] [place] [address] Phone: [phone] Fax: [phone]  Uses pill box? No Pt endorses 1000% compliance  We discussed: Verbal consent obtained for [name] enhanced pharmacy services (medication synchronization, adherence packaging, delivery coordination). A medication sync plan was created to allow patient to get all medications delivered once every 30 to  90 days per patient preference. Patient understands they have freedom to choose pharmacy and clinical pharmacist will coordinate care between all prescribers and [name]. Patient decided to: Utilize [name] for medication synchronization, packaging and delivery  Care Plan and Follow Up Patient Decision:  Patient agrees to Care Plan and Follow-up.  Plan: Telephone follow up appointment with care management team member scheduled for:  01/21/2022 at 10:00 AM  [name], PharmD, BCACP, [place] Clinical Pharmacist Practitioner  [facility] [place]  Current Barriers:  Unable to independently afford treatment regimen Unable to achieve control of Diabetes   Pharmacist Clinical Goal(s):  Patient will verbalize ability to afford treatment regimen achieve control of diabetes as evidenced by A1c less than 7% through collaboration with PharmD and provider.   Interventions: 1:1 collaboration with Bonnita Hollow, MD regarding development and update of comprehensive plan of care as evidenced by provider attestation and co-signature Inter-disciplinary care team collaboration (see longitudinal plan of care) Comprehensive medication review performed; medication list updated in electronic medical record  Hypertension (BP goal <130/80) -Controlled -Current treatment: Amlodipine 5 mg daily: Appropriate, Effective, Safe, Accessible -Medications previously tried: NA  -Current home readings: Does not monitor routinely at home.  -Denies hypotensive/hypertensive symptoms -Recommended to continue current medication  Hyperlipidemia: (LDL goal < 70) -Controlled -Current treatment: Rosuvastatin 10 mg nightly: Appropriate, Effective, Safe, Accessible  -Medications previously tried: NA  -Recommended to continue current medication  Diabetes (A1c goal <7%) -Uncontrolled -Diagnosed in 30s  -Current medications: Farxiga 5 mg daily: Appropriate, Query  Effective -Medications previously tried: Metformin (GI), Tyler Aas  -Adherence conerns with [name], forgetting to take in the morning.  -Current home glucose readings: checking 4 times daily  30-day average Averaging 140s  Lowest in recall 80-90s  -Reports hypoglycemia symptoms. "Fuzzy headed" lows in the 90s-100s and will treat with orange juice.  -Educated on Prevention and management of hypoglycemic episodes; discussed utilizing orange juice only if blood sugars are less than 70 and to have a high-protein, low carbohydrate snack when feeling relative hypoglycemia symptoms in the 90-100s.   Anxiety (Goal: Maintain remission of symptoms) -Controlled -Current treatment: Citalopram 40 mg daily: Appropriate, Effective, Query Safe -Medications previously tried/failed: NA -GAD7: NA -Could decrease citalopram to 20 mg daily to minimize risk of Qtc prolongation. Will defer in this patient in light of ongoing arthritis pain and other medication changes.  -Recommended to continue current medication  Chronic Pain (Goal: [place] pain) -Uncontrolled -Osteoarthritis in knees, neuropathy in legs, possible rheumatologic illness in hands.  -Current treatment  Gabapentin 600 mg twice daily: Appropriate, Effective, Safe, Accessible  Also using for restless legs/anxiety  -Medications previously tried: NA   -Continues to struggle with swelling and stiffness in hands. Referred to rheumatology, but not scheduled until Feb 2024.  -May need another course of prednisone to help with pain and swelling, patient concerned due to effect on blood sugars.  -Recommended to continue current medication  Patient Goals/Self-Care Activities Patient will:  - check glucose daily before breakfast, document, and provide at future appointments check blood pressure weekly, document, and provide at future appointments  Follow Up Plan: ***

## 2022-01-25 ENCOUNTER — Ambulatory Visit (INDEPENDENT_AMBULATORY_CARE_PROVIDER_SITE_OTHER): Payer: Medicare Other | Admitting: Neurology

## 2022-01-25 ENCOUNTER — Encounter: Payer: Self-pay | Admitting: Neurology

## 2022-01-25 VITALS — BP 124/82 | HR 86 | Ht 64.0 in | Wt 159.0 lb

## 2022-01-25 DIAGNOSIS — G2581 Restless legs syndrome: Secondary | ICD-10-CM

## 2022-01-25 DIAGNOSIS — G4719 Other hypersomnia: Secondary | ICD-10-CM | POA: Diagnosis not present

## 2022-01-25 DIAGNOSIS — G478 Other sleep disorders: Secondary | ICD-10-CM | POA: Insufficient documentation

## 2022-01-25 DIAGNOSIS — Z9989 Dependence on other enabling machines and devices: Secondary | ICD-10-CM

## 2022-01-25 DIAGNOSIS — G4733 Obstructive sleep apnea (adult) (pediatric): Secondary | ICD-10-CM | POA: Diagnosis not present

## 2022-01-25 NOTE — Patient Instructions (Signed)
Sleep Apnea Sleep apnea affects breathing during sleep. It causes breathing to stop for 10 seconds or more, or to become shallow. People with sleep apnea usually snore loudly. It can also increase the risk of: Heart attack. Stroke. Being very overweight (obese). Diabetes. Heart failure. Irregular heartbeat. High blood pressure. The goal of treatment is to help you breathe normally again. What are the causes?  The most common cause of this condition is a collapsed or blocked airway. There are three kinds of sleep apnea: Obstructive sleep apnea. This is caused by a blocked or collapsed airway. Central sleep apnea. This happens when the brain does not send the right signals to the muscles that control breathing. Mixed sleep apnea. This is a combination of obstructive and central sleep apnea. What increases the risk? Being overweight. Smoking. Having a small airway. Being older. Being female. Drinking alcohol. Taking medicines to calm yourself (sedatives or tranquilizers). Having family members with the condition. Having a tongue or tonsils that are larger than normal. What are the signs or symptoms? Trouble staying asleep. Loud snoring. Headaches in the morning. Waking up gasping. Dry mouth or sore throat in the morning. Being sleepy or tired during the day. If you are sleepy or tired during the day, you may also: Not be able to focus your mind (concentrate). Forget things. Get angry a lot and have mood swings. Feel sad (depressed). Have changes in your personality. Have less interest in sex, if you are female. Be unable to have an erection, if you are female. How is this treated?  Sleeping on your side. Using a medicine to get rid of mucus in your nose (decongestant). Avoiding the use of alcohol, medicines to help you relax, or certain pain medicines (narcotics). Losing weight, if needed. Changing your diet. Quitting smoking. Using a machine to open your airway while you  sleep, such as: An oral appliance. This is a mouthpiece that shifts your lower jaw forward. A CPAP device. This device blows air through a mask when you breathe out (exhale). An EPAP device. This has valves that you put in each nostril. A BIPAP device. This device blows air through a mask when you breathe in (inhale) and breathe out. Having surgery if other treatments do not work. Follow these instructions at home: Lifestyle Make changes that your doctor recommends. Eat a healthy diet. Lose weight if needed. Avoid alcohol, medicines to help you relax, and some pain medicines. Do not smoke or use any products that contain nicotine or tobacco. If you need help quitting, ask your doctor. General instructions Take over-the-counter and prescription medicines only as told by your doctor. If you were given a machine to use while you sleep, use it only as told by your doctor. If you are having surgery, make sure to tell your doctor you have sleep apnea. You may need to bring your device with you. Keep all follow-up visits. Contact a doctor if: The machine that you were given to use during sleep bothers you or does not seem to be working. You do not get better. You get worse. Get help right away if: Your chest hurts. You have trouble breathing in enough air. You have an uncomfortable feeling in your back, arms, or stomach. You have trouble talking. One side of your body feels weak. A part of your face is hanging down. These symptoms may be an emergency. Get help right away. Call your local emergency services (911 in the [place] Do not wait to see if the   symptoms will go away. Do not drive yourself to the hospital. Summary This condition affects breathing during sleep. The most common cause is a collapsed or blocked airway. The goal of treatment is to help you breathe normally while you sleep. This information is not intended to replace advice given to you by your health care provider. Make  sure you discuss any questions you have with your health care provider. Document Revised: 12/10/2020 Document Reviewed: 04/11/2020 Elsevier Patient Education  2023 Elsevier Inc. Quality Sleep Information, Adult Quality sleep is important for your mental and physical health. It also improves your quality of life. Quality sleep means you: Are asleep for most of the time you are in bed. Fall asleep within 30 minutes. Wake up no more than once a night. Are awake for no longer than 20 minutes if you do wake up during the night. Most adults need 7-8 hours of quality sleep each night. How can poor sleep affect me? If you do not get enough quality sleep, you may have: Mood swings. Daytime sleepiness. Decreased alertness, reaction time, and concentration. Sleep disorders, such as insomnia and sleep apnea. Difficulty with: Solving problems. Coping with stress. Paying attention. These issues may affect your performance and productivity at work, school, and home. Lack of sleep may also put you at higher risk for accidents, suicide, and risky behaviors. If you do not get quality sleep, you may also be at higher risk for several health problems, including: Infections. Type 2 diabetes. Heart disease. High blood pressure. Obesity. Worsening of long-term conditions, like arthritis, kidney disease, depression, Parkinson's disease, and epilepsy. What actions can I take to get more quality sleep? Sleep schedule and routine Stick to a sleep schedule. Go to sleep and wake up at about the same time each day. Do not try to sleep less on weekdays and make up for lost sleep on weekends. This does not work. Limit naps during the day to 30 minutes or less. Do not take naps in the late afternoon. Make time to relax before bed. Reading, listening to music, or taking a hot bath promotes quality sleep. Make your bedroom a place that promotes quality sleep. Keep your bedroom dark, quiet, and at a comfortable room  temperature. Make sure your bed is comfortable. Avoid using electronic devices that give off bright blue light for 30 minutes before bedtime. Your brain perceives bright blue light as sunlight. This includes television, phones, and computers. If you are lying awake in bed for longer than 20 minutes, get up and do a relaxing activity until you feel sleepy. Lifestyle     Try to get at least 30 minutes of exercise on most days. Do not exercise 2-3 hours before going to bed. Do not use any products that contain nicotine or tobacco. These products include cigarettes, chewing tobacco, and vaping devices, such as e-cigarettes. If you need help quitting, ask your health care provider. Do not drink caffeinated beverages for at least 8 hours before going to bed. Coffee, tea, and some sodas contain caffeine. Do not drink alcohol or eat large meals close to bedtime. Try to get at least 30 minutes of sunlight every day. Morning sunlight is best. Medical concerns Work with your health care provider to treat medical conditions that may affect sleeping, such as: Nasal obstruction. Snoring. Sleep apnea and other sleep disorders. Talk to your health care provider if you think any of your prescription medicines may cause you to have difficulty falling or staying asleep. If you have   sleep problems, talk with a sleep consultant. If you think you have a sleep disorder, talk with your health care provider about getting evaluated by a specialist. Where to find more information Sleep Foundation: sleepfoundation.org American Academy of Sleep Medicine: aasm.org Centers for Disease Control and Prevention (CDC): cdc.gov Contact a health care provider if: You have trouble getting to sleep or staying asleep. You often wake up very early in the morning and cannot get back to sleep. You have daytime sleepiness. You have daytime sleep attacks of suddenly falling asleep and sudden muscle weakness (narcolepsy). You have a  tingling sensation in your legs with a strong urge to move your legs (restless legs syndrome). You stop breathing briefly during sleep (sleep apnea). You think you have a sleep disorder or are taking a medicine that is affecting your quality of sleep. Summary Most adults need 7-8 hours of quality sleep each night. Getting enough quality sleep is important for your mental and physical health. Make your bedroom a place that promotes quality sleep, and avoid things that may cause you to have poor sleep, such as alcohol, caffeine, smoking, or large meals. Talk to your health care provider if you have trouble falling asleep or staying asleep. This information is not intended to replace advice given to you by your health care provider. Make sure you discuss any questions you have with your health care provider. Document Revised: 08/26/2021 Document Reviewed: 08/26/2021 Elsevier Patient Education  2023 Elsevier Inc.  

## 2022-01-25 NOTE — Progress Notes (Signed)
SLEEP MEDICINE CLINIC    Provider:  Larey Seat, MD  Primary Care Physician:  [facility], Corena Pilgrim, MD [place] [address]     Referring Provider: Bonnita Hollow, [place],  [place] [id number]           Chief Complaint according to patient   Patient presents with:     New Patient (Initial Visit)           HISTORY OF PRESENT ILLNESS:  Kelli Ruiz is a 66 y.o. year old White or Caucasian female patient seen here as a referral on 01/25/2022 from her PCP  for a transfer of OSA / CPAP care. .  Chief concern according to patient :  Pt states that she was dg with osa in 1990. She has not had a PSG in 7-9 years and feels that there have been changes in her sleep. She is needing a new provider and to establish care for treatment of OSA. She is using a RSMED S 39 - and had a last sleep study with Dr Laney Pastor, in [place].     Kelli Ruiz  has a past medical history of Arthritis, Complication of anesthesia, Depression, Diabetes mellitus without complication ([place]), Hypertension, Obese (01/19/2017), RLS (restless legs syndrome), and Sleep apnea.     Sleep relevant medical history: see above     Family medical /sleep history: nephew with OSA.    Social history:  Patient is retired from [employer] , had a fall in 07-2013, since then she  had 5 knee surgeries, was not able to return.   she lives in a household with brother in Sports coach and sister- dog and cat-  The patient used to work in shifts( day/ Presenter, broadcasting,) Tobacco use- vaping -quit all in 2022- ETOH use : none,  Caffeine intake in form of Coffee( in AM ) Soda( /) Tea ( hot) and energy drinks. Regular exercise in form of PT, dog walking.        Sleep habits are as follows: The patient's dinner time is between 6-7 PM. The patient goes to bed at 10 PM and does read, falls asleep at 11.30-12. She continues to sleep for 2 hours, wakes for  bathroom breaks,  from pain , and by her dog- the first time at 2-3 AM.  The preferred sleep position is supine, with the support of 2 pillows.  Dreams are reportedly rare.  7.30  AM is the usual rise time. The patient wakes up around 7 Am , the dog wakes her  She reports not feeling refreshed or restored in AM, with symptoms such as dry mouth, morning headaches, and residual fatigue.  Naps are taken frequently, in PM- lasting from 45 to 120 minutes and are more refreshing but is affecting  nocturnal sleep.   I was able to get a download for the compliance for the compliance for the compliance for Ms. the residual AHI on a 12 cm set CPAP was 1.9/h average user time 8 hours 9 minutes each night and her compliance was 97%.  This is a VPAP adapt [place] that has not been produced in over 7 years.   Review of Systems: Out of a complete 14 system review, the patient complains of only the following symptoms, and all other reviewed systems are negative.:  Fatigue, sleepiness , snoring, fragmented sleep, Insomnia  - dog in the bedroom.  How likely are you to doze in the following situations: 0 = not likely, 1 = slight chance, 2 = moderate chance, 3 = high chance   Sitting and Reading? Watching Television? Sitting inactive in a public place (theater or meeting)? As a passenger in a car for an hour without a break? Lying down in the afternoon when circumstances permit? Sitting and talking to someone? Sitting quietly after lunch without alcohol? In a car, while stopped for a few minutes in traffic?   Total = 12/ 24 points   FSS endorsed at 44/ 63 points.  GDS at 2/ 15 for  a depression treated patient with anxiety, and RLS?   Social History   Socioeconomic History   Marital status: Single    Spouse name: Not on file   Number of children: Not on file   Years of education: Not on file   Highest education level: Not on file  Occupational History   Not on file  Tobacco Use   Smoking status: Former     Years: 20.00    Types: Cigarettes    Quit date: 01/09/2017    Years since quitting: 5.0   Smokeless tobacco: Never   Tobacco comments:    Pt uses vapor  Vaping Use   Vaping Use: Some days  Substance and Sexual Activity   Alcohol use: No   Drug use: No   Sexual activity: Not on file  Other Topics Concern   Not on file  Social History Narrative   Not on file   Social Determinants of Health   Financial Resource Strain: High Risk (12/17/2021)   Overall Financial Resource Strain (CARDIA)    Difficulty of Paying Living Expenses: Hard  Food Insecurity: Not on file  Transportation Needs: No Transportation Needs (12/17/2021)   PRAPARE - Hydrologist (Medical): No    Lack of Transportation (Non-Medical): No  Physical Activity: Not on file  Stress: Not on file  Social Connections: Not on file    No family history on file.  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 90 tablet 1    blood glucose meter kit and supplies Dispense based on patient and insurance preference. Use up to four times daily as directed. (FOR ICD-10 E10.9, E11.9). 1 each 0   citalopram (CELEXA) 40 MG  tablet Take 1 tablet (40 mg total) by mouth daily. 90 tablet 1   dapagliflozin propanediol (FARXIGA) 10 MG TABS tablet Take 1 tablet (10 mg total) by mouth daily before breakfast. Patient receives via [name] Patient Assistance 90 tablet 3   fluticasone (FLONASE) 50 MCG/ACT nasal spray Place 2 sprays into both nostrils daily as needed for allergies or rhinitis.     gabapentin (NEURONTIN) 600 MG tablet Take 1 tablet (600 mg total) by mouth 2 (two) times daily. 180 tablet 1   glucose blood test strip Use as instructed 100 each 12   ibuprofen (ADVIL) 200 MG tablet Take 200 mg by mouth every 6 (six) hours as needed for mild pain or headache.     Insulin Pen Needle 32G X 4 MM MISC Use with insulin once daily  DX R73.03 and Z79.4     Lancets MISC 1 each by Does not apply route in the morning, at noon, in the evening, and at bedtime. 100 each 12   rosuvastatin (CRESTOR) 10 MG tablet Take 1 tablet (10 mg total) by mouth at bedtime. 90 tablet 3   Turmeric 500 MG CAPS Take 500 mg by mouth daily.     No current facility-administered medications on file prior to visit.    No Known Allergies  Physical exam:  Today's Vitals   01/25/22 1503  BP: 124/82  Pulse: 86  Weight: 159 lb (72.1 kg)  Height: 5' 4"  (1.626 m)   Body mass index is 27.29 kg/m.   Wt Readings from Last 3 Encounters:  01/25/22 159 lb (72.1 kg)  01/11/22 161 lb 6.4 oz (73.2 kg)  12/14/21 163 lb (73.9 kg)     Ht Readings from Last 3 Encounters:  01/25/22 5' 4"  (1.626 m)  11/23/21 5' 3.5" (1.613 m)  11/14/21 5' 4"  (1.626 m)      General: The patient is awake, alert and appears not in acute distress. The patient is well groomed. Head: Normocephalic, atraumatic. Neck is supple.  Mallampati 2,  neck circumference:18 inches .  Nasal airflow  patent.  Retrognathia is  seen.  Dental status: biological  Cardiovascular:  Regular rate and cardiac rhythm by pulse,  without distended neck veins. Respiratory: Lungs are clear to auscultation.  Skin:  Without evidence of ankle edema, or rash. Trunk: The patient's posture is erect.   Neurologic exam : The patient is awake and alert, oriented to place and time.   Memory subjective described as intact.  Attention span & concentration ability appears normal.  Speech is fluent,  without  dysarthria, dysphonia or aphasia.  Mood and affect are appropriate.   Cranial nerves: no loss of smell or taste reported   Titubation, mild tremor and quivering of the lips.  Pupils are equal and briskly reactive to light. Funduscopic exam deferred. .  Extraocular movements in vertical and horizontal planes were intact and without nystagmus. No Diplopia. Visual fields by finger perimetry are intact. Hearing was intact to soft voice and finger rubbing.    Facial sensation intact to fine touch.  Facial motor strength is symmetric and tongue and uvula move midline.  Neck ROM : rotation, tilt and flexion extension were normal for age and shoulder shrug was symmetrical.    Motor exam:  Symmetric bulk, tone and ROM.   Normal tone without cog wheeling, symmetric grip strength .   Sensory:  Fine touch, pinprick and vibration were tested  and  normal.  Proprioception tested in the upper extremities was normal.  Coordination: Rapid alternating movements in the fingers/hands were of normal speed.  The Finger-to-nose maneuver was intact without evidence of ataxia, dysmetria or tremor.   Gait and station: Patient could rise unassisted from a seated position, walked without assistive device.  She has a limp-  Stance is of normal width/ base.  Toe and heel walk were deferred.  Deep tendon reflexes: in the upper extremities are symmetric and intact.  Babinski response was deferred      After spending a total  time of  45  minutes face to face and additional time for physical and neurologic examination, review of laboratory studies,  personal review of imaging studies, reports and results of other testing and review of referral information / records as far as provided in visit, I have established the following assessments:  There were a lot of medical changes , she gained and lost weight , she had 5 knee surgeries and was on pain medication. DM has progressed.  The patient reports hypoglycemic episodes in the morning, interrupted sleep at night, and she has trouble getting supplies now without a medical prescription for her old CPAP.  She was finally able to  quit smoking an vaping.   1) OSA -  needs a new baseline status.  2) CPAP dependent- needs a new device auto CPAP .  3)  mask preferred is a nasal pillow, PILAIRO f and P. oral air leak is high- chin strap .  4) sleep hygiene- set a bedtime and rise time, allow for 8 hours of sleep time.  5) nasal septal deviation with 'hump' sensitive bridge of her nose- mat need gecko skin.    My Plan is to proceed with: HST, B New CPAP, new pillairo nasal pillow, gecko skin.    I would like to thank Bonnita Hollow, MD for allowing me to meet with and to take care of this pleasant patient.   In short, Kelli Ruiz is presenting with need for a new sleep test and new machine.  I plan to follow up either personally or through our NP within circa  2-4 months.   CC: I will share my notes with PCP.   Electronically signed by: Larey Seat, MD 01/25/2022 3:08 PM  [facility] Board certified by The [employer] of Sleep Medicine and Diplomate of the [employer] of Sleep Medicine. Board certified In Neurology through the [place], Fellow of the [employer] of Neurology. Medical Director of [facility].

## 2022-01-25 NOTE — Patient Instructions (Signed)
Visit Information It was great speaking with you today!  Please let me know if you have any questions about our visit.   Goals Addressed             This Visit's Progress    Monitor and Manage My Blood Sugar-Diabetes Type 2   On track    Timeframe:  Long-Range Goal Priority:  High Start Date: 12/17/21                            Expected End Date: 12/18/22                      Follow Up within 30 days   - check blood sugar at prescribed times - check blood sugar if I feel it is too high or too low - take the blood sugar log to all doctor visits    Why is this important?   Checking your blood sugar at home helps to keep it from getting very high or very low.  Writing the results in a diary or log helps the doctor know how to care for you.  Your blood sugar log should have the time, date and the results.  Also, write down the amount of insulin or other medicine that you take.  Other information, like what you ate, exercise done and how you were feeling, will also be helpful.     Notes:         Patient Care Plan: General Pharmacy (Adult)     Problem Identified: Hypertension, Hyperlipidemia, Diabetes, Anxiety, Osteoarthritis, and Restless Legs Syndrome   Priority: High     Long-Range Goal: Patient-Specific Goal   Start Date: 12/17/2021  Expected End Date: 12/18/2022  This Visit's Progress: On track  Recent Progress: On track  Priority: High  Note:   Current Barriers:  Unable to independently afford treatment regimen Unable to achieve control of Diabetes   Pharmacist Clinical Goal(s):  Patient will verbalize ability to afford treatment regimen achieve control of diabetes as evidenced by A1c less than 7% through collaboration with PharmD and provider.   Interventions: 1:1 collaboration with [name], MD regarding development and update of comprehensive plan of care as evidenced by provider attestation and co-signature Inter-disciplinary care team collaboration  (see longitudinal plan of care) Comprehensive medication review performed; medication list updated in electronic medical record  Hypertension (BP goal <130/80) -Controlled -Current treatment: Amlodipine 5 mg daily: Appropriate, Effective, Safe, Accessible -Medications previously tried: NA  -Current home readings: Does not monitor routinely at home.  -Denies hypotensive/hypertensive symptoms -Recommended to continue current medication  Hyperlipidemia: (LDL goal < 70) -Controlled -Current treatment: Rosuvastatin 10 mg nightly: Appropriate, Effective, Safe, Accessible  -Medications previously tried: NA  -Recommended to continue current medication  Diabetes (A1c goal <7%) -Uncontrolled -Diagnosed in 30s  -Current medications: Farxiga 5 mg daily: Appropriate, Query Effective -Medications previously tried: Metformin (GI), Evaristo Bury  -Adherence conerns with Marcelline Deist, forgetting to take in the morning.  -Current home glucose readings: checking 4 times daily  30-day average Averaging 140s  Lowest in recall 80-90s  -Reports hypoglycemia symptoms. "Fuzzy headed" lows in the 90s-100s and will treat with orange juice.  -Educated on Prevention and management of hypoglycemic episodes; discussed utilizing orange juice only if blood sugars are less than 70 and to have a high-protein, low carbohydrate snack when feeling relative hypoglycemia symptoms in the 90-100s.   Anxiety (Goal: Maintain remission of symptoms) -Controlled -Current  treatment: Citalopram 40 mg daily: Appropriate, Effective, Query Safe -Medications previously tried/failed: NA -GAD7: NA -Could decrease citalopram to 20 mg daily to minimize risk of Qtc prolongation. Will defer in this patient in light of ongoing arthritis pain and other medication changes.  -Recommended to continue current medication  Chronic Pain (Goal: Minimiez pain) -Uncontrolled -Osteoarthritis in knees, neuropathy in legs, possible rheumatologic illness in  hands.  -Current treatment  Gabapentin 600 mg twice daily: Appropriate, Effective, Safe, Accessible  Also using for restless legs/anxiety  -Medications previously tried: NA   -Continues to struggle with swelling and stiffness in hands. Referred to rheumatology, but not scheduled until Feb 2024.  -May need another course of prednisone to help with pain and swelling, patient concerned due to effect on blood sugars.  -Recommended to continue current medication  Patient Goals/Self-Care Activities Patient will:  - check glucose daily before breakfast, document, and provide at future appointments check blood pressure weekly, document, and provide at future appointments  Follow Up Plan: Telephone follow up appointment with care management team member scheduled for:  04/29/2022 at 10:00 AM    Patient agreed to services and verbal consent obtained.   Patient verbalizes understanding of instructions and care plan provided today and agrees to view in MyChart. Active MyChart status and patient understanding of how to access instructions and care plan via MyChart confirmed with patient.     [name], PharmD, Patsy Baltimore, CPP Clinical Pharmacist Practitioner  [facility]  [phone]

## 2022-01-27 ENCOUNTER — Telehealth: Payer: Self-pay | Admitting: Neurology

## 2022-01-27 NOTE — Telephone Encounter (Signed)
HST- [name] medicare no auth req.  Patient is scheduled at [facility] for 02/23/22 at 9:30 AM.  Mailed packet to the patient.

## 2022-02-04 ENCOUNTER — Encounter: Payer: Medicare Other | Attending: Family Medicine | Admitting: Dietician

## 2022-02-04 ENCOUNTER — Encounter: Payer: Self-pay | Admitting: Dietician

## 2022-02-04 DIAGNOSIS — E1165 Type 2 diabetes mellitus with hyperglycemia: Secondary | ICD-10-CM | POA: Insufficient documentation

## 2022-02-04 NOTE — Patient Instructions (Addendum)
Consider organizing your medication in a pill box.  Resources: Calorie King app Diabetes food hub  Read labels before ordering take out   Consider making a soup or stew each week.  This makes a great lunch. Add vegetables to lunch and breakfast if desired.  You are doing a great job with adding more vegetables to dinner.  Consider a trial of no dairy to see if this improves your inflammation

## 2022-02-04 NOTE — Progress Notes (Signed)
Diabetes Self-Management Education  Visit Type: First/Initial  Appt. Start Time: 1400 Appt. End Time: 6301  02/04/2022  [name], identified by name and date of birth, is a 66 y.o. female with a diagnosis of Diabetes: Type 2.   ASSESSMENT Patient is here today alone.  Patient would like to learn more about how to control her blood glucose. She tests once per day:  130 at HS usually Struggles to remember to take the [name] on time. States that she does not always want to eat  History includes:  Type 2 diabetes, hypoglycemia (when younger), arthritis with significant hand swelling (to see a rheumatologist soon), OSA - uses C-pap, HTN, peripheral neuropathy, restless leg syndrome, Generalized anxiety disorder Medications include:  Farxiga, turmeric, Prednisone this summer which caused hyperglycemia Labs noted to include:  A1C 7.9% 11/30/2021   Weight hx: 64" 160 lbs weight gain since 2016 secondary to 5 knee surgeries 170 lbs 2023  Patient lives with her sister and brother-in-law.  She does most of the cooking but they may help on the weekends. 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 No fried foods No butter No red meat Aims to follow a Mediterranean diet and cooks from a cookbook.  Height 5\' 4"  (1.626 m), weight 160 lb (72.6 kg). Body mass index is 27.46 kg/m.   Diabetes Self-Management Education - 02/04/22 1421       Visit Information   Visit Type First/Initial      Initial Visit   Diabetes Type Type 2    Date Diagnosed 2014    Are you currently following a meal plan? Yes    What type of meal plan do you follow? Mediterranean    Are you taking your medications as prescribed? Yes      Health Coping   How would you rate your overall health? Fair      Psychosocial Assessment   Patient Belief/Attitude about Diabetes Other (comment)   barriers   What is the hardest part about  your diabetes right now, causing you the most concern, or is the most worrisome to you about your diabetes?   Being active;Checking blood sugar;Taking/obtaining medications    Self-care barriers None    Self-management support Doctor's office;Family    Other persons present Patient    Patient Concerns Nutrition/Meal planning;Glycemic Control;Problem Solving    Special Needs None    Preferred Learning Style No preference indicated    Learning Readiness Ready    How often do you need to have someone help you when you read instructions, pamphlets, or other written materials from your doctor or pharmacy? 1 - Never    What is the last grade level you completed in school? 12      Pre-Education Assessment   Patient understands the diabetes disease and treatment process. Needs Instruction    Patient understands incorporating nutritional management into lifestyle. Needs Instruction    Patient undertands incorporating physical activity into lifestyle. Needs Instruction    Patient understands using medications safely. Needs Instruction    Patient understands monitoring blood glucose, interpreting and using results Needs Instruction    Patient understands prevention, detection, and treatment of acute complications. Needs Instruction    Patient understands prevention, detection, and treatment of chronic complications. Needs Instruction    Patient understands how to develop strategies to address psychosocial issues. Needs Instruction    Patient understands how to  develop strategies to promote health/change behavior. Needs Instruction      Complications   Last HgB A1C per patient/outside source 7.9 %   11/30/2021   How often do you check your blood sugar? 1-2 times/day    Postprandial Blood glucose range (mg/dL) [phone]    Number of hypoglycemic episodes per month 0    Number of hyperglycemic episodes ( >200mg /dL): Never    Can you tell when your blood sugar is high? No    Have you had a dilated eye  exam in the past 12 months? Yes    Have you had a dental exam in the past 12 months? No    Are you checking your feet? Yes    How many days per week are you checking your feet? 2      Dietary Intake   Breakfast boiled egg, 1/2 plain bagel OR berries with oatmeal    Snack (morning) fruit and greek yogurt    Lunch tuna with no sugar sweet pickles and ritz crackers    Snack (afternoon) greek yogurt and occasional fruit    Dinner [employer] OR chick Fil-A salad OR chicken, sweet potato, and vegetabes OR [place] burger without bun, sweet potato fries OR recipe from a Mediterranean cookbook    Snack (evening) nothing after 9 pm, occasional yogurt or Nature's granola bars    Beverage(s) water, G2 gatorade, black coffee, occasional small can soda (diet or regular), OJ when blood glucose is low      Activity / Exercise   Activity / Exercise Type Light (walking / raking leaves)    How many days per week do you exercise? 7    How many minutes per day do you exercise? 15    Total minutes per week of exercise 105      Patient Education   Previous Diabetes Education No    Disease Pathophysiology Definition of diabetes, type 1 and 2, and the diagnosis of diabetes;Factors that contribute to the development of diabetes    Healthy Eating Role of diet in the treatment of diabetes and the relationship between the three main macronutrients and blood glucose level;Food label reading, portion sizes and measuring food.;Plate Method;Meal options for control of blood glucose level and chronic complications.;Information on hints to eating out and maintain blood glucose control.    Being Active Role of exercise on diabetes management, blood pressure control and cardiac health.;Helped patient identify appropriate exercises in relation to his/her diabetes, diabetes complications and other health issue.    Medications Reviewed patients medication for diabetes, action, purpose, timing of dose and side effects.    Monitoring  Yearly dilated eye exam;Daily foot exams;Identified appropriate SMBG and/or A1C goals.    Acute complications Taught prevention, symptoms, and  treatment of hypoglycemia - the 15 rule.    Chronic complications Relationship between chronic complications and blood glucose control    Diabetes Stress and Support Identified and addressed patients feelings and concerns about diabetes;Worked with patient to identify barriers to care and solutions;Role of stress on diabetes      Individualized Goals (developed by patient)   Nutrition General guidelines for healthy choices and portions discussed    Physical Activity 30 minutes per day;Exercise 5-7 days per week    Medications take my medication as prescribed    Monitoring  Test my blood glucose as discussed    Problem Solving Eating Pattern    Reducing Risk treat hypoglycemia with 15 grams of carbs if blood glucose less than /dL;do foot  checks daily      Post-Education Assessment   Patient understands the diabetes disease and treatment process. Comprehends key points    Patient understands incorporating nutritional management into lifestyle. Comprehends key points    Patient undertands incorporating physical activity into lifestyle. Comprehends key points    Patient understands using medications safely. Comphrehends key points    Patient understands monitoring blood glucose, interpreting and using results Comprehends key points    Patient understands prevention, detection, and treatment of acute complications. Comprehends key points    Patient understands prevention, detection, and treatment of chronic complications. Comprehends key points    Patient understands how to develop strategies to address psychosocial issues. Comprehends key points    Patient understands how to develop strategies to promote health/change behavior. Comprehends key points      Outcomes   Expected Outcomes Demonstrated interest in learning. Expect positive outcomes     Future DMSE 3-4 months    Program Status Not Completed             Individualized Plan for Diabetes Self-Management Training:   Learning Objective:  Patient will have a greater understanding of diabetes self-management. Patient education plan is to attend individual and/or group sessions per assessed needs and concerns.   Plan:   Patient Instructions  Consider organizing your medication in a pill box.  Resources: Calorie King app Diabetes food hub  Read labels before ordering take out   Consider making a soup or stew each week.  This makes a great lunch. Add vegetables to lunch and breakfast if desired.  You are doing a great job with adding more vegetables to dinner.  Consider a trial of no dairy to see if this improves your inflammation     Expected Outcomes:  Demonstrated interest in learning. Expect positive outcomes  Education material provided: ADA - How to Thrive: A Guide for Your Journey with Diabetes, Food label handouts, Meal plan card, Snack sheet, and Diabetes Resources  If problems or questions, patient to contact team via:  Phone  Future DSME appointment: 3-4 months

## 2022-02-12 ENCOUNTER — Ambulatory Visit: Payer: PRIVATE HEALTH INSURANCE | Admitting: Dietician

## 2022-02-13 DIAGNOSIS — E785 Hyperlipidemia, unspecified: Secondary | ICD-10-CM

## 2022-02-13 DIAGNOSIS — E1165 Type 2 diabetes mellitus with hyperglycemia: Secondary | ICD-10-CM | POA: Diagnosis not present

## 2022-02-13 DIAGNOSIS — Z7984 Long term (current) use of oral hypoglycemic drugs: Secondary | ICD-10-CM

## 2022-02-13 DIAGNOSIS — I10 Essential (primary) hypertension: Secondary | ICD-10-CM

## 2022-02-18 ENCOUNTER — Institutional Professional Consult (permissible substitution): Payer: Medicare Other | Admitting: Neurology

## 2022-02-23 ENCOUNTER — Ambulatory Visit: Payer: Medicare Other | Admitting: Neurology

## 2022-02-23 ENCOUNTER — Ambulatory Visit: Payer: PRIVATE HEALTH INSURANCE | Admitting: Dietician

## 2022-02-23 DIAGNOSIS — G4719 Other hypersomnia: Secondary | ICD-10-CM

## 2022-02-23 DIAGNOSIS — G4733 Obstructive sleep apnea (adult) (pediatric): Secondary | ICD-10-CM | POA: Diagnosis not present

## 2022-02-23 DIAGNOSIS — G478 Other sleep disorders: Secondary | ICD-10-CM

## 2022-02-25 ENCOUNTER — Telehealth: Payer: Self-pay | Admitting: Family Medicine

## 2022-02-25 NOTE — Telephone Encounter (Signed)
Left message for patient to call back and schedule Medicare Annual Wellness Visit (AWV).   Please offer to do virtually or by telephone.  Left office number and my jabber #[phone].  AWVI eligible as of 03/17/2021  Please schedule at anytime with Nurse Health Advisor.   

## 2022-02-25 NOTE — Progress Notes (Signed)
[facility]  Kelli Ruiz SLEEP TEST REPORT ( by Watch PAT)   STUDY DATE:  02-24-2022 DOB:  08-15-1955    ORDERING CLINICIAN: Melvyn Novas, MD  REFERRING CLINICIAN: [name], MD    CLINICAL INFORMATION/HISTORY: referral on 01/25/2022 by PCP  for a transfer of OSA / CPAP care.   Chief concern according to patient :  Kelli Ruiz was diagnosed with OSA in 1990. She has not had a PSG in 9 years and feels that there have been changes in her sleep quality. She is needing a new provider and to establish care for treatment of OSA. She is using a RSMED S 9 - and had a last sleep study with Dr Hazle Coca, in [place].  Kelli Ruiz has a past medical history of Arthritis, Complication of anesthesia, Depression, Diabetes mellitus without complication (HCC), Hypertension, Obesity(01/19/2017), RLS (restless legs syndrome), and Obstructive Sleep apnea on CPAP.     Epworth sleepiness score:12 /24.  FSS at 44/63 , GDS at 2/ 15 points , treated for depression/ anxiety   BMI: 27 kg/m   Neck Circumference: 18"   FINDINGS:   Sleep Summary:   Total Recording Time (hours, min):   11 hours 3 minutes with a total sleep time of 9 hours 54 minutes and 15.1% REM sleep time.                                   Respiratory Indices:   Calculated pAHI (per hour):   38.1/h                          REM pAHI:   58.4/h                                              NREM pAHI:   34.8/h                           Positional AHI: Supine sleep was associated with an AHI of 36.3/h sleep on the right side with an AHI of 53.7/h which is unusual.   The RDI in supine sleep was 88.2/h and sleep on the right side had an RDI of 10/h. Snoring reached a mean volume of 48 dB and was present for over half of the total sleep time.                                                 Oxygen Saturation Statistics:   O2 Saturation Range (%):   Between a nadir at 82% and a maximum of 98% with a mean  saturation at 91%.                                    O2 Saturation (minutes) <89%:    17.5 minutes O2 saturation in minutes<90%:    54 minutes       Pulse Rate Statistics:   Pulse Mean (bpm):    71  Pulse Range:   Pulse range between 50 and 101 bpm              IMPRESSION:  This HST confirms the presence of severe sleep apnea associated with oxygen desaturation, intermittent bradycardia, and loud snoring especially in supine sleep position.  Sleep on the right side did actually increase the patient's apneas and hypopneas.     RECOMMENDATION: This is REM sleep dependent severe apnea with an unusual positional distribution.  I recommend strongly to use an autotitration CPAP device will allow a setting between 5 and 20 cmH2O pressure 2 cm EPR, heated humidification and an interface of patient's choice and comfort.  The patient is currently using a PO nasal pillow mask, by [employer].  I will also ask for gecko skin to cover her nasal bridge as needed.    INTERPRETING PHYSICIAN:   Carmen Dohmeier, MD   Medical Director of [facility].                     

## 2022-03-01 ENCOUNTER — Telehealth: Payer: Self-pay

## 2022-03-01 ENCOUNTER — Telehealth: Payer: Self-pay | Admitting: Family Medicine

## 2022-03-01 DIAGNOSIS — E1165 Type 2 diabetes mellitus with hyperglycemia: Secondary | ICD-10-CM

## 2022-03-01 NOTE — Progress Notes (Signed)
Error

## 2022-03-01 NOTE — Progress Notes (Signed)
    Chronic Care Management Pharmacy Assistant   Name: Kelli Ruiz  MRN: [id number] DOB: 08-Mar-1956  Reason for Encounter: Medication Review/Medication Coordination Call.   Recent office visits:  None ID  Recent consult visits:  02/04/2022 Antonieta Iba RN (Nutrition and Diabetes Educations) No Medication Changes noted 01/25/2022 Dr. Brett Fairy MD (Neurology) No medication Changes noted.  Hospital visits:  None in previous 6 months  Medications: Outpatient Encounter Medications as of 03/01/2022  Medication Sig   Accu-Chek FastClix Lancets MISC Use to check blood sugar 4 times daily DX code R73.03 and z79.4   amLODipine (NORVASC) 5 MG tablet Take 1 tablet (5 mg total) by mouth daily.   blood glucose meter kit and supplies Dispense based on patient and insurance preference. Use up to four times daily as directed. (FOR ICD-10 E10.9, E11.9).   citalopram (CELEXA) 40 MG tablet Take 1 tablet (40 mg total) by mouth daily.   dapagliflozin propanediol (FARXIGA) 10 MG TABS tablet Take 1 tablet (10 mg total) by mouth daily before breakfast. Patient receives via [name] Patient Assistance   fluticasone (FLONASE) 50 MCG/ACT nasal spray Place 2 sprays into both nostrils daily as needed for allergies or rhinitis.   gabapentin (NEURONTIN) 600 MG tablet Take 1 tablet (600 mg total) by mouth 2 (two) times daily.   glucose blood test strip Use as instructed   ibuprofen (ADVIL) 200 MG tablet Take 200 mg by mouth every 6 (six) hours as needed for mild pain or headache.   Insulin Pen Needle 32G X 4 MM MISC Use with insulin once daily  DX R73.03 and Z79.4   Lancets MISC 1 each by Does not apply route in the morning, at noon, in the evening, and at bedtime.   rosuvastatin (CRESTOR) 10 MG tablet Take 1 tablet (10 mg total) by mouth at bedtime.   Turmeric 500 MG CAPS Take 500 mg by mouth daily.   No facility-administered encounter medications on file as of 03/01/2022.    Care Gaps: COVID-19 Vaccine Foot  Exam Urine Microalbumin Hepatitis C Screening Shingrix Vaccine   Star Rating Drugs: Farxiga 5 mg - receives from [name] Rosuvastatin 10 mg last filled on 02/12/2022 for 20 day supply at [facility].   Medication fills gaps: None ID  Reviewed chart for medication changes ahead of medication coordination call.  BP Readings from Last 3 Encounters:  01/25/22 124/82  01/11/22 136/82  12/14/21 124/78    Lab Results  Component Value Date   HGBA1C 7.9 (H) 11/30/2021     Patient obtains medications through Adherence Packaging  30 Days   Last adherence delivery included:  None ID  Patient declined medications last month: None ID  Patient is due for next adherence delivery on: 03/11/2022. Called patient and reviewed medications and coordinated delivery.  This delivery to include: Gabapentin 600 mg 1 tablet two time daily - Breakfast, Bedtime Amlodipine 5 mg 1 tablet daily - Breakfast Citalopram 40 mg 1 tablet daily - Breakfast Rosuvastatin 10 mg 1 tablet daily - Bedtime Lancets Misc Glucose test strips - (patient checks blood sugar once daily).  Patient declined the following medications: Farxiga 10 mg - receives patient assistance from [name]  Patient needs refills for Lancets and Glucose test strips .  Confirmed delivery date of 03/11/2022 (First route), advised patient that pharmacy will contact them the morning of delivery.  [place] Pharmacist Assistant [phone]

## 2022-03-03 MED ORDER — LANCETS MISC: 1.0000 | Freq: Four times a day (QID) | 12 refills | Status: DC

## 2022-03-03 MED ORDER — GLUCOSE BLOOD VI STRP: ORAL_STRIP | 12 refills | Status: DC

## 2022-03-03 NOTE — Addendum Note (Signed)
Addended by: Daron Offer A on: 03/03/2022 12:05 PM   Modules accepted: Orders

## 2022-03-04 NOTE — Addendum Note (Signed)
Addended by: Larey Seat on: 03/04/2022 12:53 PM   Modules accepted: Orders

## 2022-03-04 NOTE — Procedures (Signed)
[facility] TEST REPORT ( by Watch PAT)   STUDY DATE:  02-24-2022 DOB:  [birth year]    ORDERING CLINICIAN: Larey Seat, MD  REFERRING CLINICIAN: Bonnita Hollow, MD    CLINICAL INFORMATION/HISTORY: referral on 01/25/2022 by PCP  for a transfer of OSA / CPAP care.   Chief concern according to patient :  Ms Terris was diagnosed with OSA in 1990. She has not had a PSG in 9 years and feels that there have been changes in her sleep quality. She is needing a new provider and to establish care for treatment of OSA. She is using a RSMED S 68 - and had a last sleep study with Dr Laney Pastor, in [place].  Marcel A Fermin has a past medical history of Arthritis, Complication of anesthesia, Depression, Diabetes mellitus without complication ([place]), Hypertension, Obesity(01/19/2017), RLS (restless legs syndrome), and Obstructive Sleep apnea on CPAP.     Epworth sleepiness score:12 /24.  FSS at 44/63 , GDS at 2/ 15 points , treated for depression/ anxiety   BMI: 27 kg/m   Neck Circumference: 18"   FINDINGS:   Sleep Summary:   Total Recording Time (hours, min):   11 hours 3 minutes with a total sleep time of 9 hours 54 minutes and 15.1% REM sleep time.                                   Respiratory Indices:   Calculated pAHI (per hour):   38.1/h                          REM pAHI:   58.4/h                                              NREM pAHI:   34.8/h                           Positional AHI: Supine sleep was associated with an AHI of 36.3/h sleep on the right side with an AHI of 53.7/h which is unusual.   The RDI in supine sleep was 88.2/h and sleep on the right side had an RDI of 10/h. Snoring reached a mean volume of 48 dB and was present for over half of the total sleep time.                                                 Oxygen Saturation Statistics:   O2 Saturation Range (%):   Between a nadir at 82% and a maximum of 98% with a mean  saturation at 91%.                                    O2 Saturation (minutes) <89%:    17.5 minutes O2 saturation in minutes<90%:    54 minutes       Pulse Rate Statistics:   Pulse Mean (bpm):    71 bpm  Pulse Range:   Pulse range between 50 and 101 bpm              IMPRESSION:  This HST confirms the presence of severe sleep apnea associated with oxygen desaturation, intermittent bradycardia, and loud snoring especially in supine sleep position.  Sleep on the right side did actually increase the patient's apneas and hypopneas.     RECOMMENDATION: This is REM sleep dependent severe apnea with an unusual positional distribution.  I recommend strongly to use an autotitration CPAP device will allow a setting between 5 and 20 cmH2O pressure 2 cm EPR, heated humidification and an interface of patient's choice and comfort.  The patient is currently using a PO nasal pillow mask, by [employer].  I will also ask for gecko skin to cover her nasal bridge as needed.    INTERPRETING PHYSICIAN:   Melvyn Novas, MD   Medical Director of [facility].

## 2022-03-08 ENCOUNTER — Telehealth: Payer: Self-pay | Admitting: Neurology

## 2022-03-08 NOTE — Telephone Encounter (Signed)
-----   Message from Carmen Dohmeier, MD sent at 03/04/2022 12:53 PM EDT ----- IMPRESSION:  This HST confirms the presence of severe sleep apnea associated with oxygen desaturation, intermittent bradycardia, and loud snoring especially in supine sleep position.  Sleep on the right side did actually increase the patient's apneas and hypopneas.    RECOMMENDATION: This is REM sleep dependent severe apnea with an unusual positional distribution.  I recommend strongly to use an autotitration CPAP device will allow a setting between 5 and 20 cmH2O pressure 2 cm EPR, heated humidification and an interface of patient's choice and comfort.  The patient is currently using a PO nasal pillow mask, by [employer].  I will also ask for gecko skin to cover her nasal bridge as needed.  

## 2022-03-08 NOTE — Telephone Encounter (Signed)
Called patient to discuss sleep study results. No answer at this time. LVM for the patient to call back.   

## 2022-03-08 NOTE — Telephone Encounter (Signed)
-----   Message from Larey Seat, MD sent at 03/04/2022 12:53 PM EDT ----- IMPRESSION:  This HST confirms the presence of severe sleep apnea associated with oxygen desaturation, intermittent bradycardia, and loud snoring especially in supine sleep position.  Sleep on the right side did actually increase the patient's apneas and hypopneas.    RECOMMENDATION: This is REM sleep dependent severe apnea with an unusual positional distribution.  I recommend strongly to use an autotitration CPAP device will allow a setting between 5 and 20 cmH2O pressure 2 cm EPR, heated humidification and an interface of patient's choice and comfort.  The patient is currently using a PO nasal pillow mask, by [employer].  I will also ask for gecko skin to cover her nasal bridge as needed.

## 2022-03-08 NOTE — Telephone Encounter (Signed)
I called pt. I advised pt that Dr. Brett Fairy reviewed their sleep study results and found that pt has severe sleep apnea. Dr. Brett Fairy recommends that pt starts auto CPAP. I reviewed PAP compliance expectations with the pt. Pt is agreeable to starting a CPAP. I advised pt that an order will be sent to a DME, [facility], and [facility] will call the pt within about one week after they file with the pt's insurance. [facility] will show the pt how to use the machine, fit for masks, and troubleshoot the CPAP if needed. A follow up appt was made for insurance purposes with Frann Rider on 06/03/2021 at 10:15 am. Pt verbalized understanding to arrive 15 minutes early and bring their CPAP. A letter with all of this information in it will be mailed to the pt as a reminder. I verified with the pt that the address we have on file is correct. Pt verbalized understanding of results. Pt had no questions at this time but was encouraged to call back if questions arise. I have sent the order to [facility]  and have received confirmation that they have received the order.

## 2022-03-09 ENCOUNTER — Ambulatory Visit (INDEPENDENT_AMBULATORY_CARE_PROVIDER_SITE_OTHER): Payer: Medicare Other

## 2022-03-09 VITALS — Ht 64.0 in | Wt 160.0 lb

## 2022-03-09 DIAGNOSIS — Z1211 Encounter for screening for malignant neoplasm of colon: Secondary | ICD-10-CM | POA: Diagnosis not present

## 2022-03-09 DIAGNOSIS — Z0000 Encounter for general adult medical examination without abnormal findings: Secondary | ICD-10-CM | POA: Diagnosis not present

## 2022-03-09 DIAGNOSIS — Z0100 Encounter for examination of eyes and vision without abnormal findings: Secondary | ICD-10-CM | POA: Diagnosis not present

## 2022-03-09 NOTE — Progress Notes (Signed)
Subjective:   Kelli Ruiz is a 66 y.o. female who presents for an Initial Medicare Annual Wellness Visit.   I connected with  Lavida A Landrus on 03/09/22 by a audio enabled telemedicine application and verified that I am speaking with the correct person using two identifiers.  Patient Location: Home  Provider Location: Home Office  I discussed the limitations of evaluation and management by telemedicine. The patient expressed understanding and agreed to proceed.  Review of Systems     Cardiac Risk Factors include: advanced age (>37mn, >>74women);diabetes mellitus;hypertension     Objective:    Today's Vitals   03/09/22 1437  Weight: 160 lb (72.6 kg)  Height: 5' 4"  (1.626 m)   Body mass index is 27.46 kg/m.     03/09/2022    2:43 PM 02/04/2022    2:07 PM 11/14/2021   11:18 AM 01/18/2017    2:49 PM 01/13/2017    2:44 PM 11/29/2016   10:28 AM  Advanced Directives  Does Patient Have a Medical Advance Directive? Yes No No No No No  Type of AParamedicof AFort [place] will       Copy of HGrahamin Chart? No - copy requested       Would patient like information on creating a medical advance directive?  Yes (MAU/Ambulatory/Procedural Areas - Information given)  No - Patient declined No - Patient declined No - Patient declined    Current Medications (verified) Outpatient Encounter Medications as of 03/09/2022  Medication Sig   amLODipine (NORVASC) 5 MG tablet Take 1 tablet (5 mg total) by mouth daily.   blood glucose meter kit and supplies Dispense based on patient and insurance preference. Use up to four times daily as directed. (FOR ICD-10 E10.9, E11.9).   citalopram (CELEXA) 40 MG tablet Take 1 tablet (40 mg total) by mouth daily.   dapagliflozin propanediol (FARXIGA) 10 MG TABS tablet Take 1 tablet (10 mg total) by mouth daily before breakfast. Patient receives via [name] Patient Assistance   fluticasone (FLONASE) 50 MCG/ACT nasal  spray Place 2 sprays into both nostrils daily as needed for allergies or rhinitis.   gabapentin (NEURONTIN) 600 MG tablet Take 1 tablet (600 mg total) by mouth 2 (two) times daily.   glucose blood test strip Use as instructed   ibuprofen (ADVIL) 200 MG tablet Take 200 mg by mouth every 6 (six) hours as needed for mild pain or headache.   Insulin Pen Needle 32G X 4 MM MISC Use with insulin once daily  DX R73.03 and Z79.4   Lancets MISC 1 each by Does not apply route in the morning, at noon, in the evening, and at bedtime.   rosuvastatin (CRESTOR) 10 MG tablet Take 1 tablet (10 mg total) by mouth at bedtime.   Turmeric 500 MG CAPS Take 500 mg by mouth daily.   No facility-administered encounter medications on file as of 03/09/2022.    Allergies (verified) Patient has no known allergies.   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 No pertinent family history. Social History   Socioeconomic History   Marital status: Single    Spouse name: Not on file   Number of children: Not on file   Years of education: Not on file   Highest education level: Not on file  Occupational History   Not on file  Tobacco Use    Smoking status: Former    Years: 20.00    Types: Cigarettes    Quit date: 01/09/2017    Years since quitting: 5.1   Smokeless tobacco: Never   Tobacco comments:    Pt uses vapor  Vaping Use   Vaping Use: Some days  Substance and Sexual Activity   Alcohol use: No   Drug use: No   Sexual activity: Not on file  Other Topics Concern   Not on file  Social History Narrative   Not on file   Social Determinants of Health   Financial Resource Strain: Low Risk  (03/09/2022)   Overall Financial Resource Strain (CARDIA)    Difficulty of Paying Living Expenses: Not hard at all  Recent Concern: Financial Resource Strain - High Risk (12/17/2021)   Overall Financial Resource Strain (CARDIA)    Difficulty of Paying Living Expenses: Hard  Food Insecurity: No Food Insecurity (03/09/2022)   Hunger Vital Sign    Worried About Running Out of Food in the Last Year: Never true    [place] in the Last Year: Never true  Transportation Needs: No Transportation Needs (03/09/2022)   PRAPARE - Hydrologist (Medical): No    Lack of Transportation (Non-Medical): No  Physical Activity: Sufficiently Active (03/09/2022)   Exercise Vital Sign    Days of Exercise per Week: 5 days    Minutes of Exercise per Session: 30 min  Stress: No Stress Concern Present (03/09/2022)   [place]    Feeling of Stress : Not at all  Social Connections: Socially Isolated (03/09/2022)   Social Connection and Isolation Panel [NHANES]    Frequency of Communication with Friends and Family: More than three times a week    Frequency of Social Gatherings with Friends and Family: More than three times a week    Attends Religious Services: Never    Marine scientist or Organizations: No    Attends Music therapist: Never    Marital Status: Never married    Tobacco Counseling Counseling given: Not  Answered Tobacco comments: Pt uses vapor   Clinical Intake:  Pre-visit preparation completed: Yes  Pain : No/denies pain     Nutritional Risks: None Diabetes: No  How often do you need to have someone help you when you read instructions, pamphlets, or other written materials from your doctor or pharmacy?: 1 - Never  Diabetic?yes  Nutrition Risk Assessment:  Has the patient had any N/V/D within the last 2 months?  No  Does the patient have any non-healing wounds?  No  Has the patient had any  unintentional weight loss or weight gain?  No   Diabetes:  Is the patient diabetic?  Yes  If diabetic, was a CBG obtained today?  No  Did the patient bring in their glucometer from home?  No  How often do you monitor your CBG's? Once a day   Financial Strains and Diabetes Management:  Are you having any financial strains with the device, your supplies or your medication? No .  Does the patient want to be seen by Chronic Care Management for management of their diabetes?  No  Would the patient like to be referred to a Nutritionist or for Diabetic Management?  No   Diabetic Exams:  Diabetic Eye Exam: Completed 01/2022 Diabetic Foot Exam: Overdue, Pt has been advised about the importance in completing this exam. Pt is scheduled for diabetic foot exam on next office visit .   Interpreter Needed?: No  Information entered by :: Jadene Pierini, LPN   Activities of Daily Living    03/09/2022    2:41 PM  In your present state of health, do you have any difficulty performing the following activities:  Hearing? 0  Vision? 0  Difficulty concentrating or making decisions? 0  Walking or climbing stairs? 0  Dressing or bathing? 0  Doing errands, shopping? 0  Preparing Food and eating ? N  Using the Toilet? N  In the past six months, have you accidently leaked urine? N  Do you have problems with loss of bowel control? N  Managing your Medications? N  Managing your Finances? N   Housekeeping or managing your Housekeeping? N    Patient Care Team: Bonnita Hollow, MD as PCP - General (Family Medicine) Germaine Pomfret, [facility] (Pharmacist)  Indicate any recent Medical Services you may have received from other than [facility] providers in the past year (date may be approximate).     Assessment:   This is a routine wellness examination for Ellenie.  Hearing/Vision screen Vision Screening - Comments:: Referral 03/09/2022  Dietary issues and exercise activities discussed: Current Exercise Habits: Home exercise routine, Type of exercise: walking, Time (Minutes): 30, Frequency (Times/Week): 5, Weekly Exercise (Minutes/Week): 150, Intensity: Mild, Exercise limited by: None identified   Goals Addressed             This Visit's Progress    Exercise 3x per week (30 min per time)         Depression Screen    02/04/2022    2:05 PM 11/23/2021    2:15 PM  PHQ 2/9 Scores  PHQ - 2 Score 0 0  PHQ- 9 Score  0    Fall Risk    03/09/2022    2:39 PM 02/04/2022    2:05 PM  Fall Risk   Falls in the past year? 0 0  Number falls in past yr: 0   Injury with Fall? 0   Risk for fall due to : No Fall Risks   Follow up Falls prevention discussed     FALL RISK PREVENTION PERTAINING TO THE HOME:  Any stairs in or around the home? No  If so, are there any without handrails? No  Home free of loose throw rugs in walkways, pet beds, electrical cords, etc? Yes  Adequate lighting in your home to reduce risk of falls? Yes   ASSISTIVE DEVICES UTILIZED TO PREVENT FALLS:  Life alert? No  Use of a cane, walker or w/c? No  Grab bars in the bathroom? No  Shower chair or bench  in shower? No  Elevated toilet seat or a handicapped toilet? No        03/09/2022    2:43 PM  6CIT Screen  What Year? 0 points  What month? 0 points  What time? 0 points  Count back from 20 0 points  Months in reverse 0 points  Repeat phrase 0 points  Total Score 0 points     Immunizations  There is no immunization history on file for this patient.  TDAP status: Due, Education has been provided regarding the importance of this vaccine. Advised may receive this vaccine at local pharmacy or Health Dept. Aware to provide a copy of the vaccination record if obtained from local pharmacy or Health Dept. Verbalized acceptance and understanding.  Flu Vaccine status: Due, Education has been provided regarding the importance of this vaccine. Advised may receive this vaccine at local pharmacy or Health Dept. Aware to provide a copy of the vaccination record if obtained from local pharmacy or Health Dept. Verbalized acceptance and understanding.  Pneumococcal vaccine status: Due, Education has been provided regarding the importance of this vaccine. Advised may receive this vaccine at local pharmacy or Health Dept. Aware to provide a copy of the vaccination record if obtained from local pharmacy or Health Dept. Verbalized acceptance and understanding.  Covid-19 vaccine status: Completed vaccines  Qualifies for Shingles Vaccine? Yes   Zostavax completed No   Shingrix Completed?: No.    Education has been provided regarding the importance of this vaccine. Patient has been advised to call insurance company to determine out of pocket expense if they have not yet received this vaccine. Advised may also receive vaccine at local pharmacy or Health Dept. Verbalized acceptance and understanding.  Screening Tests Health Maintenance  Topic Date Due   COVID-19 Vaccine (1) Never done   FOOT EXAM  Never done   Diabetic kidney evaluation - Urine ACR  Never done   Hepatitis C Screening  Never done   Zoster Vaccines- Shingrix (1 of 2) Never done   INFLUENZA VACCINE  08/15/2022 (Originally 12/15/2021)   Pneumonia Vaccine 10+ Years old (1 - PCV) 11/24/2022 (Originally 04/02/1962)   COLONOSCOPY (Pts 45-14yr Insurance coverage will need to be confirmed)  11/24/2022 (Originally 04/02/2000)    TETANUS/TDAP  11/24/2022 (Originally 04/02/1974)   HEMOGLOBIN A1C  06/02/2022   MAMMOGRAM  10/30/2022   Diabetic kidney evaluation - GFR measurement  11/15/2022   OPHTHALMOLOGY EXAM  12/15/2022   Medicare Annual Wellness (AWV)  04/09/2023   DEXA SCAN  Completed   HPV VACCINES  Aged Out    Health Maintenance  Health Maintenance Due  Topic Date Due   COVID-19 Vaccine (1) Never done   FOOT EXAM  Never done   Diabetic kidney evaluation - Urine ACR  Never done   Hepatitis C Screening  Never done   Zoster Vaccines- Shingrix (1 of 2) Never done    Colorectal cancer screening: Referral to GI placed 03/09/2022. Pt aware the office will call re: appt.  Mammogram status: Completed 10/29/2021. Repeat every year  Bone Density status: Completed 10/29/2021. Results reflect: Bone density results: OSTEOPENIA. Repeat every 5 years.  Lung Cancer Screening: (Low Dose CT Chest recommended if Age 67-80years, 30 pack-year currently smoking OR have quit w/in 15years.) does not qualify.   Lung Cancer Screening Referral: n/a  Additional Screening:  Hepatitis C Screening: does qualify;   Vision Screening: Recommended annual ophthalmology exams for early detection of glaucoma and other disorders of the eye. Is the  patient up to date with their annual eye exam?  No  Who is the provider or what is the name of the office in which the patient attends annual eye exams? None referral 03/09/2022 If pt is not established with a provider, would they like to be referred to a provider to establish care? No .   Dental Screening: Recommended annual dental exams for proper oral hygiene  Community Resource Referral / Chronic Care Management: CRR required this visit?  No   CCM required this visit?  No      Plan:     I have personally reviewed and noted the following in the patient's chart:   Medical and social history Use of alcohol, tobacco or illicit drugs  Current medications and supplements  including opioid prescriptions. Patient is not currently taking opioid prescriptions. Functional ability and status Nutritional status Physical activity Advanced directives List of other physicians Hospitalizations, surgeries, and ER visits in previous 12 months Vitals Screenings to include cognitive, depression, and falls Referrals and appointments  In addition, I have reviewed and discussed with patient certain preventive protocols, quality metrics, and best practice recommendations. A written personalized care plan for preventive services as well as general preventive health recommendations were provided to patient.     Daphane Shepherd, LPN   03/49/1791   Nurse Notes: referral colonoscopy and opthalmology

## 2022-03-09 NOTE — Patient Instructions (Signed)
Kelli Ruiz , Thank you for taking time to come for your Medicare Wellness Visit. I appreciate your ongoing commitment to your health goals. Please review the following plan we discussed and let me know if I can assist you in the future.   These are the goals we discussed:  Goals      Exercise 3x per week (30 min per time)     Monitor and Manage My Blood Sugar-Diabetes Type 2     Timeframe:  Long-Range Goal Priority:  High Start Date: 12/17/21                            Expected End Date: 12/18/22                      Follow Up within 30 days   - check blood sugar at prescribed times - check blood sugar if I feel it is too high or too low - take the blood sugar log to all doctor visits    Why is this important?   Checking your blood sugar at home helps to keep it from getting very high or very low.  Writing the results in a diary or log helps the doctor know how to care for you.  Your blood sugar log should have the time, date and the results.  Also, write down the amount of insulin or other medicine that you take.  Other information, like what you ate, exercise done and how you were feeling, will also be helpful.     Notes:         This is a list of the screening recommended for you and due dates:  Health Maintenance  Topic Date Due   COVID-19 Vaccine (1) Never done   Complete foot exam   Never done   Yearly kidney health urinalysis for diabetes  Never done   Hepatitis C Screening: USPSTF Recommendation to screen - Ages 31-79 yo.  Never done   Zoster (Shingles) Vaccine (1 of 2) Never done   Flu Shot  08/15/2022*   Pneumonia Vaccine (1 - PCV) 11/24/2022*   Colon Cancer Screening  11/24/2022*   Tetanus Vaccine  11/24/2022*   Hemoglobin A1C  06/02/2022   Mammogram  10/30/2022   Yearly kidney function blood test for diabetes  11/15/2022   Eye exam for diabetics  12/15/2022   Medicare Annual Wellness Visit  04/09/2023   DEXA scan (bone density measurement)  Completed   HPV  Vaccine  Aged Out  *Topic was postponed. The date shown is not the original due date.    Advanced directives: Advance directive discussed with you today. I have provided a copy for you to complete at home and have notarized. Once this is complete please bring a copy in to our office so we can scan it into your chart.   Conditions/risks identified: Aim for 30 minutes of exercise or brisk walking, 6-8 glasses of water, and 5 servings of fruits and vegetables each day.   Next appointment: Follow up in one year for your annual wellness visit    Preventive Care 65 Years and Older, Female Preventive care refers to lifestyle choices and visits with your health care provider that can promote health and wellness. What does preventive care include? A yearly physical exam. This is also called an annual well check. Dental exams once or twice a year. Routine eye exams. Ask your health care provider how often  you should have your eyes checked. Personal lifestyle choices, including: Daily care of your teeth and gums. Regular physical activity. Eating a healthy diet. Avoiding tobacco and drug use. Limiting alcohol use. Practicing safe sex. Taking low-dose aspirin every day. Taking vitamin and mineral supplements as recommended by your health care provider. What happens during an annual well check? The services and screenings done by your health care provider during your annual well check will depend on your age, overall health, lifestyle risk factors, and family history of disease. Counseling  Your health care provider may ask you questions about your: Alcohol use. Tobacco use. Drug use. Emotional well-being. Home and relationship well-being. Sexual activity. Eating habits. History of falls. Memory and ability to understand (cognition). Work and work Statistician. Reproductive health. Screening  You may have the following tests or measurements: Height, weight, and BMI. Blood  pressure. Lipid and cholesterol levels. These may be checked every 5 years, or more frequently if you are over 96 years old. Skin check. Lung cancer screening. You may have this screening every year starting at age 80 if you have a 30-pack-year history of smoking and currently smoke or have quit within the past 15 years. Fecal occult blood test (FOBT) of the stool. You may have this test every year starting at age 2. Flexible sigmoidoscopy or colonoscopy. You may have a sigmoidoscopy every 5 years or a colonoscopy every 10 years starting at age 72. Hepatitis C blood test. Hepatitis B blood test. Sexually transmitted disease (STD) testing. Diabetes screening. This is done by checking your blood sugar (glucose) after you have not eaten for a while (fasting). You may have this done every 1-3 years. Bone density scan. This is done to screen for osteoporosis. You may have this done starting at age 47. Mammogram. This may be done every 1-2 years. Talk to your health care provider about how often you should have regular mammograms. Talk with your health care provider about your test results, treatment options, and if necessary, the need for more tests. Vaccines  Your health care provider may recommend certain vaccines, such as: Influenza vaccine. This is recommended every year. Tetanus, diphtheria, and acellular pertussis (Tdap, Td) vaccine. You may need a Td booster every 10 years. Zoster vaccine. You may need this after age 74. Pneumococcal 13-valent conjugate (PCV13) vaccine. One dose is recommended after age 44. Pneumococcal polysaccharide (PPSV23) vaccine. One dose is recommended after age 57. Talk to your health care provider about which screenings and vaccines you need and how often you need them. This information is not intended to replace advice given to you by your health care provider. Make sure you discuss any questions you have with your health care provider. Document Released:  05/30/2015 Document Revised: 01/21/2016 Document Reviewed: 03/04/2015 Elsevier Interactive Patient Education  2017 [place] Prevention in the Home Falls can cause injuries. They can happen to people of all ages. There are many things you can do to make your home safe and to help prevent falls. What can I do on the outside of my home? Regularly fix the edges of walkways and driveways and fix any cracks. Remove anything that might make you trip as you walk through a door, such as a raised step or threshold. Trim any bushes or trees on the path to your home. Use bright outdoor lighting. Clear any walking paths of anything that might make someone trip, such as rocks or tools. Regularly check to see if handrails are loose or broken.  Make sure that both sides of any steps have handrails. Any raised decks and porches should have guardrails on the edges. Have any leaves, snow, or ice cleared regularly. Use sand or salt on walking paths during winter. Clean up any spills in your garage right away. This includes oil or grease spills. What can I do in the bathroom? Use night lights. Install grab bars by the toilet and in the tub and shower. Do not use towel bars as grab bars. Use non-skid mats or decals in the tub or shower. If you need to sit down in the shower, use a plastic, non-slip stool. Keep the floor dry. Clean up any water that spills on the floor as soon as it happens. Remove soap buildup in the tub or shower regularly. Attach bath mats securely with double-sided non-slip rug tape. Do not have throw rugs and other things on the floor that can make you trip. What can I do in the bedroom? Use night lights. Make sure that you have a light by your bed that is easy to reach. Do not use any sheets or blankets that are too big for your bed. They should not hang down onto the floor. Have a firm chair that has side arms. You can use this for support while you get dressed. Do not have  throw rugs and other things on the floor that can make you trip. What can I do in the kitchen? Clean up any spills right away. Avoid walking on wet floors. Keep items that you use a lot in easy-to-reach places. If you need to reach something above you, use a strong step stool that has a grab bar. Keep electrical cords out of the way. Do not use floor polish or wax that makes floors slippery. If you must use wax, use non-skid floor wax. Do not have throw rugs and other things on the floor that can make you trip. What can I do with my stairs? Do not leave any items on the stairs. Make sure that there are handrails on both sides of the stairs and use them. Fix handrails that are broken or loose. Make sure that handrails are as long as the stairways. Check any carpeting to make sure that it is firmly attached to the stairs. Fix any carpet that is loose or worn. Avoid having throw rugs at the top or bottom of the stairs. If you do have throw rugs, attach them to the floor with carpet tape. Make sure that you have a light switch at the top of the stairs and the bottom of the stairs. If you do not have them, ask someone to add them for you. What else can I do to help prevent falls? Wear shoes that: Do not have high heels. Have rubber bottoms. Are comfortable and fit you well. Are closed at the toe. Do not wear sandals. If you use a stepladder: Make sure that it is fully opened. Do not climb a closed stepladder. Make sure that both sides of the stepladder are locked into place. Ask someone to hold it for you, if possible. Clearly mark and make sure that you can see: Any grab bars or handrails. First and last steps. Where the edge of each step is. Use tools that help you move around (mobility aids) if they are needed. These include: Canes. Walkers. Scooters. Crutches. Turn on the lights when you go into a dark area. Replace any light bulbs as soon as they burn out. Set up your  furniture so  you have a clear path. Avoid moving your furniture around. If any of your floors are uneven, fix them. If there are any pets around you, be aware of where they are. Review your medicines with your doctor. Some medicines can make you feel dizzy. This can increase your chance of falling. Ask your doctor what other things that you can do to help prevent falls. This information is not intended to replace advice given to you by your health care provider. Make sure you discuss any questions you have with your health care provider. Document Released: 02/27/2009 Document Revised: 10/09/2015 Document Reviewed: 06/07/2014 Elsevier Interactive Patient Education  2017 [employer].

## 2022-03-16 ENCOUNTER — Telehealth: Payer: Self-pay | Admitting: Neurology

## 2022-03-16 NOTE — Telephone Encounter (Signed)
Message from [place], [place] was relayed to pt.

## 2022-03-16 NOTE — Telephone Encounter (Signed)
Yes, we spoke with [facility] this am an they are ok for her to be set up with the RESMED 10. Her script doesn't limit to Resmed 11, Dr Dohmeier just wants to make sure they are set up with resmed brand. I will forward this to [facility] as well.

## 2022-03-16 NOTE — Telephone Encounter (Signed)
Pt called wanting to inform provider that the DME is out of the machine she was to get and there is a 6 week delay in getting them. Pt states that they do have the Resmed 10 in stock and they are wanting to know if she can use that one and if so can the Rx be changed to the Resmed 10. Please advise.

## 2022-03-17 ENCOUNTER — Telehealth: Payer: Self-pay

## 2022-03-17 NOTE — Telephone Encounter (Signed)
According to Med Adherence Report, pt overdue for refill on METFORMIN TAB 500MG  and FARXIGA TAB 5MG . Unable to LVM.

## 2022-03-17 NOTE — Telephone Encounter (Signed)
Caller Name: Nitasha Jewel Call back phone #: [phone]  Reason for Call: Pt is no longer taking Metformin, Dr. Grandville Silos switched her over to Rosuvastatin.

## 2022-03-19 DIAGNOSIS — G4733 Obstructive sleep apnea (adult) (pediatric): Secondary | ICD-10-CM | POA: Diagnosis not present

## 2022-03-30 ENCOUNTER — Telehealth: Payer: Self-pay

## 2022-03-30 NOTE — Progress Notes (Signed)
Chronic Care Management Pharmacy Assistant   Name: Kelli Ruiz  MRN: [id number] DOB: October 21, 1955  Reason for Encounter: Medication Review/Medication Coordination Call.   Recent office visits:  03/09/2022 Medicare Wellness Completed, No medication Changes noted,  referral to colonoscopy and opthalmology   Recent consult visits:  None ID  Hospital visits:  None in previous 6 months  Medications: Outpatient Encounter Medications as of 03/30/2022  Medication Sig   amLODipine (NORVASC) 5 MG tablet Take 1 tablet (5 mg total) by mouth daily.   blood glucose meter kit and supplies Dispense based on patient and insurance preference. Use up to four times daily as directed. (FOR ICD-10 E10.9, E11.9).   citalopram (CELEXA) 40 MG tablet Take 1 tablet (40 mg total) by mouth daily.   dapagliflozin propanediol (FARXIGA) 10 MG TABS tablet Take 1 tablet (10 mg total) by mouth daily before breakfast. Patient receives via [name] Patient Assistance   fluticasone (FLONASE) 50 MCG/ACT nasal spray Place 2 sprays into both nostrils daily as needed for allergies or rhinitis.   gabapentin (NEURONTIN) 600 MG tablet Take 1 tablet (600 mg total) by mouth 2 (two) times daily.   glucose blood test strip Use as instructed   ibuprofen (ADVIL) 200 MG tablet Take 200 mg by mouth every 6 (six) hours as needed for mild pain or headache.   Insulin Pen Needle 32G X 4 MM MISC Use with insulin once daily  DX R73.03 and Z79.4   Lancets MISC 1 each by Does not apply route in the morning, at noon, in the evening, and at bedtime.   rosuvastatin (CRESTOR) 10 MG tablet Take 1 tablet (10 mg total) by mouth at bedtime.   Turmeric 500 MG CAPS Take 500 mg by mouth daily.   No facility-administered encounter medications on file as of 03/30/2022.    Care Gaps: COVID-19 Vaccine Foot Exam Urine Microalbumin Hepatitis C Screening Shingrix Vaccine   Star Rating Drugs: Farxiga 5 mg - receives from [name] Rosuvastatin 10 mg  last filled on 03/08/2022 for 20 day supply at [facility].   Medication fills gaps: None ID  Reviewed chart for medication changes ahead of medication coordination call.  BP Readings from Last 3 Encounters:  01/25/22 124/82  01/11/22 136/82  12/14/21 124/78    Lab Results  Component Value Date   HGBA1C 7.9 (H) 11/30/2021     Patient obtains medications through Adherence Packaging  30 Days   Last adherence delivery included:  Gabapentin 600 mg 1 tablet two time daily - Breakfast, Bedtime Amlodipine 5 mg 1 tablet daily - Breakfast Citalopram 40 mg 1 tablet daily - Breakfast Rosuvastatin 10 mg 1 tablet daily - Bedtime Lancets Misc Glucose test strips - (patient checks blood sugar once daily).  Patient declined medications last month: Farxiga 10 mg - receives patient assistance from [name]   Patient is due for next adherence delivery on: 04/12/2022. Called patient and reviewed medications and coordinated delivery.  This delivery to include: Gabapentin 600 mg 1 tablet two time daily - Breakfast, Bedtime Amlodipine 5 mg 1 tablet daily - Breakfast Citalopram 40 mg 1 tablet daily - Breakfast Rosuvastatin 10 mg 1 tablet daily - Bedtime   Patient declined the following medications Lancets Misc- Adequate Supply Glucose test strips - (patient checks blood sugar once daily)- Adequate Supply. Farxiga 10 mg - receives patient assistance from [name]   Patient needs refills for None ID.  Confirmed delivery date of 04/12/2022 ([facility]), advised patient that pharmacy will contact  them the morning of delivery.   [place] Pharmacist Assistant [phone]

## 2022-04-06 ENCOUNTER — Encounter: Payer: Medicare Other | Attending: Family Medicine | Admitting: Dietician

## 2022-04-06 ENCOUNTER — Encounter: Payer: Self-pay | Admitting: Dietician

## 2022-04-06 VITALS — Wt 159.0 lb

## 2022-04-06 DIAGNOSIS — E119 Type 2 diabetes mellitus without complications: Secondary | ICD-10-CM | POA: Diagnosis not present

## 2022-04-06 NOTE — Progress Notes (Signed)
Diabetes Self-Management Education  Visit Type: Follow-up  Appt. Start Time: 0945 Appt. End Time: 1020  04/06/2022  [name], identified by name and date of birth, is a 66 y.o. female with a diagnosis of Diabetes:  .   ASSESSMENT Patient is here today alone.  She was last seen by myself on 02/04/2022.  She states that she has gotten tired of the Mediterranean diet. Has been reading food labels more frequently. She has 49 yo nieces that have been visiting which changes her diet. Drinking a lot of water and sugar free gatorade hydration.  Rare 8 oz regular or diet soda. No new A1C but sees MD next week. Blood glucose around 130 at bedtime. New C-pap and feels much better.  History includes:  Type 2 diabetes, hypoglycemia (when younger), arthritis with significant hand swelling (to see a rheumatologist in February), OSA - uses C-pap, HTN, peripheral neuropathy, restless leg syndrome, Generalized anxiety disorder Medications include:  Farxiga, turmeric, Prednisone this summer which caused hyperglycemia Labs noted to include:  A1C 7.9% 11/30/2021    Weight hx: 64" 159 lbs 04/06/2022 160 lbs weight gain since 2016 secondary to 5 knee surgeries 170 lbs 2023   Patient lives with her sister and brother-in-law.  She does most of the cooking but they may help on the weekends. 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  Weight 159 lb (72.1 kg). Body mass index is 27.29 kg/m.   Diabetes Self-Management Education - 04/06/22 1032       Visit Information   Visit Type Follow-up      Psychosocial Assessment   Patient Belief/Attitude about Diabetes Motivated to manage diabetes    What is the hardest part about your diabetes right now, causing you  the most concern, or is the most worrisome to you about your diabetes?   Making healty food and beverage choices    Self-care barriers None    Self-management support Doctor's office;Family    Other persons present Patient    Patient Concerns Nutrition/Meal planning    Special Needs None    Preferred Learning Style No preference indicated    Learning Readiness Ready      Pre-Education Assessment   Patient understands the diabetes disease and treatment process. Comprehends key points    Patient understands incorporating nutritional management into lifestyle. Needs Review    Patient undertands incorporating physical activity into lifestyle. Demonstrates understanding / competency    Patient understands using medications safely. Comprehends key points    Patient understands monitoring blood glucose, interpreting and using results Comprehends key points    Patient understands prevention, detection, and treatment of acute complications. Comprehends key points    Patient understands prevention, detection, and treatment of chronic complications. Compreheands key points    Patient understands how to develop strategies to address psychosocial issues. Comprehends key points    Patient understands how to develop strategies to promote health/change behavior. Comprehends key points      Complications   How often do you check your blood sugar? 1-2 times/day    Postprandial Blood glucose range (mg/dL) 163-846   659     Dietary Intake   Breakfast 1/2 bagel with strawberry cream cheese, black coffee    Lunch chicken salad  on a small croissant OR tuna pouch    Dinner chicken salad sandwich    Beverage(s) water, sugar free gatorade hydration, 8 oz regular or diet soda occasionally      Activity / Exercise   Activity / Exercise Type Light (walking / raking leaves)    How many days per week do you exercise? 7    How many minutes per day do you exercise? 30    Total minutes per week of exercise 210       Patient Education   Previous Diabetes Education Yes (please comment)   01/2022   Healthy Eating Meal options for control of blood glucose level and chronic complications.;Role of diet in the treatment of diabetes and the relationship between the three main macronutrients and blood glucose level    Being Active Role of exercise on diabetes management, blood pressure control and cardiac health.    Medications Reviewed patients medication for diabetes, action, purpose, timing of dose and side effects.    Monitoring Identified appropriate SMBG and/or A1C goals.    Diabetes Stress and Support Worked with patient to identify barriers to care and solutions      Individualized Goals (developed by patient)   Nutrition General guidelines for healthy choices and portions discussed    Physical Activity Exercise 5-7 days per week;30 minutes per day    Medications take my medication as prescribed    Problem Solving Eating Pattern      Patient Self-Evaluation of Goals - Patient rates self as meeting previously set goals (% of time)   Nutrition 50 - 75 % (half of the time)    Physical Activity >75% (most of the time)    Medications >75% (most of the time)    Monitoring >75% (most of the time)    Problem Solving and behavior change strategies  >75% (most of the time)    Reducing Risk (treating acute and chronic complications) 50 - 75 % (half of the time)    Health Coping 50 - 75 % (half of the time)      Post-Education Assessment   Patient understands the diabetes disease and treatment process. Comprehends key points    Patient understands incorporating nutritional management into lifestyle. Comprehends key points    Patient undertands incorporating physical activity into lifestyle. Comprehends key points    Patient understands using medications safely. Comphrehends key points    Patient understands monitoring blood glucose, interpreting and using results Comprehends key points    Patient understands  prevention, detection, and treatment of acute complications. Comprehends key points    Patient understands prevention, detection, and treatment of chronic complications. Comprehends key points    Patient understands how to develop strategies to address psychosocial issues. Comprehends key points    Patient understands how to develop strategies to promote health/change behavior. Needs Review      Outcomes   Expected Outcomes Demonstrated interest in learning. Expect positive outcomes    Future DMSE 2 months    Program Status Not Completed      Subsequent Visit   Since your last visit have you experienced any weight changes? Loss    Weight Loss (lbs) 1    Since your last visit, are you checking your blood glucose at least once a day? Yes             Individualized Plan for Diabetes Self-Management Training:   Learning Objective:  Patient will have a greater understanding of diabetes self-management. Patient education plan is  to attend individual and/or group sessions per assessed needs and concerns.   Plan:   Patient Instructions  Have a vegetable AND fruit each lunch and dinner Continue to experiment with the Mediterranean cookbook Continue to read labels Avoid high fat foods Rethink your drinks.  Water is best.  Sugar containing beverages will make your blood glucose rise quickly. Consider making a soup or stew each week.  This makes a great lunch. Choose whole wheat bread and less bread in general. Less dairy.  Does dairy impact your inflammation?  Continue to walk!  Great job with this!  Resources: Calorie [employer] app Diabetes food hub fullplateliving.org      Expected Outcomes:  Demonstrated interest in learning. Expect positive outcomes  Education material provided: [employer] developer of Lifestyle Medicine) packet  If problems or questions, patient to contact team via:  Phone  Future DSME appointment: 2 months

## 2022-04-06 NOTE — Patient Instructions (Addendum)
Have a vegetable AND fruit each lunch and dinner Continue to experiment with the Mediterranean cookbook Continue to read labels Avoid high fat foods Rethink your drinks.  Water is best.  Sugar containing beverages will make your blood glucose rise quickly. Consider making a soup or stew each week.  This makes a great lunch. Choose whole wheat bread and less bread in general. Less dairy.  Does dairy impact your inflammation?  Continue to walk!  Great job with this!  Resources: Calorie [employer] app Diabetes food hub [employer]plat[employer]iving[employer]org

## 2022-04-12 ENCOUNTER — Encounter: Payer: Self-pay | Admitting: Gastroenterology

## 2022-04-13 ENCOUNTER — Encounter: Payer: Self-pay | Admitting: Family Medicine

## 2022-04-13 ENCOUNTER — Ambulatory Visit (INDEPENDENT_AMBULATORY_CARE_PROVIDER_SITE_OTHER): Payer: Medicare Other | Admitting: Family Medicine

## 2022-04-13 VITALS — BP 134/82 | HR 75 | Temp 97.5°F | Wt 159.6 lb

## 2022-04-13 DIAGNOSIS — R600 Localized edema: Secondary | ICD-10-CM | POA: Diagnosis not present

## 2022-04-13 DIAGNOSIS — M5090 Cervical disc disorder, unspecified, unspecified cervical region: Secondary | ICD-10-CM

## 2022-04-13 DIAGNOSIS — E1165 Type 2 diabetes mellitus with hyperglycemia: Secondary | ICD-10-CM

## 2022-04-13 DIAGNOSIS — Z794 Long term (current) use of insulin: Secondary | ICD-10-CM

## 2022-04-13 LAB — URINALYSIS, ROUTINE W REFLEX MICROSCOPIC
Bilirubin Urine: NEGATIVE
Ketones, ur: NEGATIVE
Leukocytes,Ua: NEGATIVE
Nitrite: NEGATIVE
Specific Gravity, Urine: 1.015 (ref 1.000–1.030)
Total Protein, Urine: NEGATIVE
Urine Glucose: >=1000 — AB
Urobilinogen, UA: 0.2 (ref 0.0–1.0)
pH: 5.5 (ref 5.0–8.0)

## 2022-04-13 LAB — POCT GLYCOSYLATED HEMOGLOBIN (HGB A1C): Hemoglobin A1C: 7.3 % — AB (ref 4.0–5.6)

## 2022-04-13 LAB — MICROALBUMIN / CREATININE URINE RATIO
Creatinine,U: 71.1 mg/dL
Microalb Creat Ratio: 1 mg/g (ref 0.0–30.0)
Microalb, Ur: <0.7 mg/dL (ref 0.0–1.9)

## 2022-04-13 NOTE — Progress Notes (Signed)
Assessment/Plan:   Problem List Items Addressed This Visit       Endocrine   Type 2 diabetes mellitus with hyperglycemia ([place]) - Primary    Improved Continue Farxiga medication      Relevant Orders   POCT HgB A1C (Completed)   Urinalysis, Routine w reflex microscopic (Completed)   Microalbumin / creatinine urine ratio (Completed)     Other   Bilateral lower extremity edema    Likely venous stasis disease Wells score 0 Bilateral lower extremity Dopplers      Relevant Orders   VAS [place] LOWER EXTREMITY VENOUS (DVT)       Subjective:  HPI:  Kelli Ruiz is a 66 y.o. female who has S/P conversion right knee; Type 2 diabetes mellitus with hyperglycemia ([place]); Hypertension associated with diabetes ([place]); Hyperlipidemia associated with type 2 diabetes mellitus ([place]); Tremor; OSA (obstructive sleep apnea); Swelling of both hands; Excessive daytime sleepiness; OSA on CPAP; Non-restorative sleep; RLS (restless legs syndrome); and Bilateral lower extremity edema on their problem list..   She  has a past medical history of Arthritis, Complication of anesthesia, Depression, Diabetes mellitus without complication ([place]), Hypertension, Neuromuscular disorder ([place]), Obese (01/19/2017), RLS (restless legs syndrome), and Sleep apnea..   She presents with chief complaint of Follow-up (DM follow up. Fasting) .  DIABETES Type II, established problem,  Medications: [name], Reports taking and tolerating without side effects. Interim History-   ROS: Denies Polydipsia, Denies  Hypoglycemia symptoms (palpitations, tremors, anxiousness)   HGBA1C Lab Results  Component Value Date   HGBA1C 7.3 (A) 04/13/2022   HGBA1C 7.9 (H) 11/30/2021   HGBA1C 6.7 (H) 11/29/2016    Lipid Panel No results found for: "CHOL", "TRIG", "HDL", "LDLCALC"  Renal Function Lab Results  Component Value Date   CREATININE 0.73 11/14/2021   MICROALBUR <0.7 04/13/2022   LABCREAU 71.1 04/13/2022   MICRALBCREAT 1.0  04/13/2022   Bilateral lower extremity swelling.  Patient with chronic bilateral lower extremity swelling.  Also with swelling in upper extremities.  Lower extremities are not worsening.  Patient has had no chest pain or shortness of breath.  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 90 tablet 1   blood glucose meter kit and supplies Dispense based on patient and insurance preference. Use up to four times daily as directed. (FOR ICD-10 E10.9, E11.9). 1 each 0   citalopram (CELEXA) 40 MG tablet Take 1 tablet (40 mg total) by mouth daily. 90 tablet 1   dapagliflozin propanediol (FARXIGA) 10 MG TABS tablet Take 1 tablet (10 mg total) by mouth daily before breakfast. Patient receives via [name] Patient Assistance 90 tablet 3   fluticasone (FLONASE) 50 MCG/ACT nasal spray Place 2 sprays into both nostrils daily as needed for allergies or rhinitis.     gabapentin (NEURONTIN) 600 MG tablet Take 1 tablet (600 mg total) by mouth 2 (two) times daily. 180 tablet 1   glucose  blood test strip Use as instructed 100 each 12   ibuprofen (ADVIL) 200 MG tablet Take 200 mg by mouth every 6 (six) hours as needed for mild pain or headache.     Insulin Pen Needle 32G X 4 MM MISC Use with insulin once daily  DX R73.03 and Z79.4     Lancets MISC 1 each by Does not apply route in the morning, at noon, in the evening, and at bedtime. 100 each 12   rosuvastatin (CRESTOR) 10 MG  tablet Take 1 tablet (10 mg total) by mouth at bedtime. 90 tablet 3   Turmeric 500 MG CAPS Take 500 mg by mouth daily. (Patient not taking: Reported on 04/13/2022)     No facility-administered medications prior to visit.    No family history on file.  Social History   Socioeconomic History   Marital status: Single    Spouse name: Not on file   Number of children: Not on file   Years of education: Not on file   Highest education level: Not on file  Occupational History   Not on file  Tobacco Use   Smoking status: Former    Years: 20.00    Types: Cigarettes    Quit date: 01/09/2017    Years since quitting: 5.2   Smokeless tobacco: Never   Tobacco comments:    Pt uses vapor  Vaping Use   Vaping Use: Some days  Substance and Sexual Activity   Alcohol use: No   Drug use: No   Sexual activity: Not on file  Other Topics Concern   Not on file  Social History Narrative   Not on file   Social Determinants of Health   Financial Resource Strain: Low Risk  (03/09/2022)   Overall Financial Resource Strain (CARDIA)    Difficulty of Paying Living Expenses: Not hard at all  Recent Concern: Financial Resource Strain - High Risk (12/17/2021)   Overall Financial Resource Strain (CARDIA)    Difficulty of Paying Living Expenses: Hard  Food Insecurity: No Food Insecurity (03/09/2022)   Hunger Vital Sign    Worried About Running Out of Food in the Last Year: Never true    [place] in the Last Year: Never true  Transportation Needs: No Transportation Needs (03/09/2022)   PRAPARE - Hydrologist (Medical): No    Lack of Transportation (Non-Medical): No  Physical Activity: Sufficiently Active (03/09/2022)   Exercise Vital Sign    Days of Exercise per Week: 5 days    Minutes of Exercise per Session: 30 min  Stress: No Stress Concern Present (03/09/2022)   [place]    Feeling of Stress :  Not at all  Social Connections: Socially Isolated (03/09/2022)   Social Connection and Isolation Panel [NHANES]    Frequency of Communication with Friends and Family: More than three times a week    Frequency of Social Gatherings with Friends and Family: More than three times a week    Attends Religious Services: Never    Marine scientist or Organizations: No    Attends Archivist Meetings: Never    Marital Status: Never married  Intimate Partner Violence: Not At Risk (03/09/2022)   Humiliation, Afraid, Rape, and Kick questionnaire    Fear of Current or Ex-Partner: No    Emotionally Abused: No    Physically Abused: No    Sexually Abused: No  Objective:  Physical Exam: BP 134/82 (BP Location: Left Arm, Patient Position: Sitting, Cuff Size: Large)   Pulse 75   Temp (!) 97.5 F (36.4 C) (Temporal)   Wt 159 lb 9.6 oz (72.4 kg)   SpO2 98%   BMI 27.40 kg/m    General: No acute distress. Awake and conversant.  Eyes: Normal conjunctiva, anicteric. Round symmetric pupils.  ENT: Hearing grossly intact. No nasal discharge.  Neck: Neck is supple. No masses or thyromegaly.  Respiratory: Respirations are non-labored. No auditory wheezing.  Skin: Warm. No rashes or ulcers.  Psych: Alert and oriented. Cooperative, Appropriate mood and affect, Normal judgment.  CV: No cyanosis or JVD MSK: 1+ bilateral pitting edema to shins normal ambulation. No clubbing  Neuro: Sensation and CN II-XII grossly normal.  Diabetic Foot Exam - Simple   Simple Foot Form Diabetic Foot exam was performed with the following findings: Yes   Visual Inspection No deformities, no ulcerations, no other skin breakdown bilaterally: Yes Sensation Testing Intact to touch and monofilament testing bilaterally: Yes Pulse Check Posterior Tibialis and Dorsalis pulse intact bilaterally: Yes Comments           Alesia Banda, MD, MS

## 2022-04-13 NOTE — Patient Instructions (Addendum)
Diabetes is doing well.  We are ordering leg ultrasounds to look at leg swelling.

## 2022-04-18 DIAGNOSIS — G4733 Obstructive sleep apnea (adult) (pediatric): Secondary | ICD-10-CM | POA: Diagnosis not present

## 2022-04-18 DIAGNOSIS — R600 Localized edema: Secondary | ICD-10-CM | POA: Insufficient documentation

## 2022-04-18 NOTE — Assessment & Plan Note (Signed)
Improved Continue Farxiga medication

## 2022-04-18 NOTE — Assessment & Plan Note (Signed)
Likely venous stasis disease Wells score 0 Bilateral lower extremity Dopplers

## 2022-04-27 ENCOUNTER — Ambulatory Visit (HOSPITAL_COMMUNITY)
Admission: RE | Admit: 2022-04-27 | Discharge: 2022-04-27 | Disposition: A | Discharge status: Home / Self Care | Payer: Medicare Other | Source: Ambulatory Visit | Attending: Internal Medicine | Admitting: Internal Medicine

## 2022-04-27 DIAGNOSIS — R600 Localized edema: Secondary | ICD-10-CM | POA: Diagnosis not present

## 2022-04-28 ENCOUNTER — Telehealth: Payer: Self-pay

## 2022-04-28 NOTE — Progress Notes (Signed)
Chronic Care Management Pharmacy Assistant   Name: Kelli Ruiz  MRN: [id number] DOB: [birth year]  Reason for Encounter: Medication Review/Medication Coordination.   Recent office visits:  04/13/2022 Dr. Grandville Silos MD (PCP) No medication Changes noted, Return in about 3 months   Recent consult visits:  04/06/2022 Antonieta Iba RD (Nutrition and Diabetes) No medication Changes noted  Hospital visits:  None in previous 6 months  Medications: Outpatient Encounter Medications as of 04/28/2022  Medication Sig   amLODipine (NORVASC) 5 MG tablet Take 1 tablet (5 mg total) by mouth daily.   blood glucose meter kit and supplies Dispense based on patient and insurance preference. Use up to four times daily as directed. (FOR ICD-10 E10.9, E11.9).   citalopram (CELEXA) 40 MG tablet Take 1 tablet (40 mg total) by mouth daily.   dapagliflozin propanediol (FARXIGA) 10 MG TABS tablet Take 1 tablet (10 mg total) by mouth daily before breakfast. Patient receives via [name] Patient Assistance   fluticasone (FLONASE) 50 MCG/ACT nasal spray Place 2 sprays into both nostrils daily as needed for allergies or rhinitis.   gabapentin (NEURONTIN) 600 MG tablet Take 1 tablet (600 mg total) by mouth 2 (two) times daily.   glucose blood test strip Use as instructed   ibuprofen (ADVIL) 200 MG tablet Take 200 mg by mouth every 6 (six) hours as needed for mild pain or headache.   Insulin Pen Needle 32G X 4 MM MISC Use with insulin once daily  DX R73.03 and Z79.4   Lancets MISC 1 each by Does not apply route in the morning, at noon, in the evening, and at bedtime.   rosuvastatin (CRESTOR) 10 MG tablet Take 1 tablet (10 mg total) by mouth at bedtime.   Turmeric 500 MG CAPS Take 500 mg by mouth daily. (Patient not taking: Reported on 04/13/2022)   No facility-administered encounter medications on file as of 04/28/2022.    Care Gaps: Hepatitis C Screening Shingrix Vaccine Dtap Vaccine   Star Rating Drugs: Farxiga  5 mg - receives from [name] Rosuvastatin 10 mg last filled on 04/05/2022 for 20 day supply at [facility].   Medication fills gaps: None ID  Reviewed chart for medication changes ahead of medication coordination call.   BP Readings from Last 3 Encounters:  04/13/22 134/82  01/25/22 124/82  01/11/22 136/82    Lab Results  Component Value Date   HGBA1C 7.3 (A) 04/13/2022     Patient obtains medications through Adherence Packaging  30 Days   Last adherence delivery included:  Gabapentin 600 mg 1 tablet two time daily - Breakfast, Bedtime Amlodipine 5 mg 1 tablet daily - Breakfast Citalopram 40 mg 1 tablet daily - Breakfast Rosuvastatin 10 mg 1 tablet daily - Bedtime  Patient declined medications last month: Lancets Misc- Adequate Supply Glucose test strips - (patient checks blood sugar once daily)- Adequate Supply. Farxiga 10 mg - receives patient assistance from [name]   Patient is due for next adherence delivery on: 05/11/2022. Called patient and reviewed medications and coordinated delivery.  This delivery to include: Gabapentin 600 mg 1 tablet two time daily - Breakfast, Bedtime Amlodipine 5 mg 1 tablet daily - Breakfast Citalopram 40 mg 1 tablet daily - Breakfast Rosuvastatin 10 mg 1 tablet daily - Bedtime  Patient declined the following medications: Lancets Misc- Adequate Supply Glucose test strips - (patient checks blood sugar once daily)- Adequate Supply. Farxiga 10 mg - receives patient assistance from [name]   Patient needs refills for None ID.  Confirmed delivery date of 05/11/2022 ([facility] route), advised patient that pharmacy will contact them the morning of delivery.  [place] Pharmacist Assistant [phone]

## 2022-04-29 ENCOUNTER — Ambulatory Visit (INDEPENDENT_AMBULATORY_CARE_PROVIDER_SITE_OTHER): Payer: Medicare Other

## 2022-04-29 DIAGNOSIS — I152 Hypertension secondary to endocrine disorders: Secondary | ICD-10-CM

## 2022-04-29 DIAGNOSIS — E1165 Type 2 diabetes mellitus with hyperglycemia: Secondary | ICD-10-CM

## 2022-04-29 NOTE — Progress Notes (Signed)
Chronic Care Management Pharmacy Note  05/11/2022 Name:  Kelli Ruiz MRN:  [id number] DOB:  05/01/1956  Summary: Patient presents for CCM follow-up. Blood sugars improving in setting of Farxiga, ongoing dietary modification.   Recommendations/Changes made from today's visit: Continue current medications  Plan: CPP follow-up 3 months.   Subjective: Kelli Ruiz is an 66 y.o. year old female who is a primary patient of Bonnita Hollow, MD.  The CCM team was consulted for assistance with disease management and care coordination needs.    Engaged with patient by telephone for follow up visit in response to provider referral for pharmacy case management and/or care coordination services.   Consent to Services:  The patient was given information about Chronic Care Management services, agreed to services, and gave verbal consent prior to initiation of services.  Please see initial visit note for detailed documentation.   Patient Care Team: Bonnita Hollow, MD as PCP - General (Family Medicine) Germaine Pomfret, [facility] (Pharmacist)  Recent office visits: 04/13/22: Patient presents to [place] for follow-up.  12/14/2021 Dr. Grandville Silos MD (PCP) Trial [name] 5 mg, Referral to CCM, Ambulatory referral to diabetic education, Return in about 4 weeks  11/30/2021 Dr. Grandville Silos MD (PCP) start prednisone 10 mg , Follow up 2 weeks 11/23/2021 Dr. Grandville Silos MD (PCP) No Medication Changes note, Recommend referral to neurology,Return in about 1 week.  Recent consult visits: No recent consult visit  Hospital visits: Admitted to the ED on 11/14/2021 due to Tremor.   Objective:  Lab Results  Component Value Date   CREATININE 0.73 11/14/2021   BUN 12 11/14/2021   GFRNONAA >60 11/14/2021   GFRAA >60 01/19/2017   NA 139 11/14/2021   K 4.5 11/14/2021   CALCIUM 9.9 11/14/2021   CO2 23 11/14/2021   GLUCOSE 165 (H) 11/14/2021    Lab Results  Component Value Date/Time   HGBA1C 7.3 (A)  04/13/2022 10:15 AM   HGBA1C 7.9 (H) 11/30/2021 11:30 AM   HGBA1C 6.7 (H) 11/29/2016 10:57 AM   MICROALBUR <0.7 04/13/2022 10:53 AM    Last diabetic Eye exam:  Lab Results  Component Value Date/Time   HMDIABEYEEXA No Retinopathy 12/14/2021 10:18 AM    Last diabetic Foot exam: No results found for: "HMDIABFOOTEX"   No results found for: "CHOL", "HDL", "LDLCALC", "LDLDIRECT", "TRIG", "CHOLHDL"     Latest Ref Rng & Units 11/14/2021   11:49 AM  Hepatic Function  Total Protein 6.5 - 8.1 g/dL 8.0   Albumin 3.5 - 5.0 g/dL 4.2   AST 15 - 41 U/L 28   ALT 0 - 44 U/L 25   Alk Phosphatase 38 - 126 U/L 60   Total Bilirubin 0.3 - 1.2 mg/dL 0.9     No results found for: "TSH", "FREET4"     Latest Ref Rng & Units 11/14/2021   11:49 AM 01/19/2017    4:53 AM 01/13/2017    2:59 PM  CBC  WBC 4.0 - 10.5 K/uL 7.6  14.6  9.3   Hemoglobin 12.0 - 15.0 g/dL 14.8  12.7  15.4   Hematocrit 36.0 - 46.0 % 44.0  38.0  45.2   Platelets 150 - 400 K/uL 273  189  183     No results found for: "VD25OH"  Clinical ASCVD: No  The ASCVD Risk score (Arnett DK, et al., 2019) failed to calculate for the following reasons:   Cannot find a previous HDL lab   Cannot find a previous total cholesterol lab  04/13/2022   10:54 AM 02/04/2022    2:05 PM 11/23/2021    2:15 PM  Depression screen PHQ 2/9  Decreased Interest 0 0 0  Down, Depressed, Hopeless 0 0 0  PHQ - 2 Score 0 0 0  Altered sleeping 0  0  Tired, decreased energy 0  0  Change in appetite 1  0  Feeling bad or failure about yourself  0  0  Trouble concentrating 0  0  Moving slowly or fidgety/restless 0  0  Suicidal thoughts 0  0  PHQ-9 Score 1  0  Difficult doing work/chores Not difficult at all  Not difficult at all    Social History   Tobacco Use  Smoking Status Former   Years: 20.00   Types: Cigarettes   Quit date: 01/09/2017   Years since quitting: 5.3  Smokeless Tobacco Never  Tobacco Comments   Pt uses vapor   BP Readings from  Last 3 Encounters:  04/13/22 134/82  01/25/22 124/82  01/11/22 136/82   Pulse Readings from Last 3 Encounters:  04/13/22 75  01/25/22 86  01/11/22 92   Wt Readings from Last 3 Encounters:  04/13/22 159 lb 9.6 oz (72.4 kg)  04/06/22 159 lb (72.1 kg)  03/09/22 160 lb (72.6 kg)   BMI Readings from Last 3 Encounters:  04/13/22 27.40 kg/m  04/06/22 27.29 kg/m  03/09/22 27.46 kg/m    Assessment/Interventions: Review of patient past medical history, allergies, medications, health status, including review of consultants reports, laboratory and other test data, was performed as part of comprehensive evaluation and provision of chronic care management services.   SDOH:  (Social Determinants of Health) assessments and interventions performed: Yes SDOH Interventions    Flowsheet Row Clinical Support from 03/09/2022 in [place] Management from 12/17/2021 in [place] Interventions Intervention Not Indicated --  Housing Interventions Intervention Not Indicated --  Transportation Interventions Intervention Not Indicated Intervention Not Indicated  Financial Strain Interventions Intervention Not Indicated Other (Comment)  [PAP]  Physical Activity Interventions Intervention Not Indicated --  Stress Interventions Intervention Not Indicated --  Social Connections Interventions Intervention Not Indicated --      SDOH Screenings   Food Insecurity: No Food Insecurity (03/09/2022)  Housing: Low Risk  (03/09/2022)  Transportation Needs: No Transportation Needs (03/09/2022)  Utilities: Not At Risk (03/09/2022)  Alcohol Screen: Low Risk  (03/09/2022)  Depression (PHQ2-9): Low Risk  (04/13/2022)  Financial Resource Strain: Low Risk  (03/09/2022)  Recent Concern: Financial Resource Strain - High Risk (12/17/2021)  Physical Activity: Sufficiently Active (03/09/2022)  Social Connections: Socially  Isolated (03/09/2022)  Stress: No Stress Concern Present (03/09/2022)  Tobacco Use: Medium Risk (04/13/2022)    [place]  No Known Allergies  Medications Reviewed Today     Reviewed by Renaee Munda, CMA (Certified Medical Assistant) on 04/13/22 at [place] List Status: <None>   Medication Order Taking? Sig Documenting Provider Last Dose Status Informant  amLODipine (NORVASC) 5 MG tablet [id number] Yes Take 1 tablet (5 mg total) by mouth daily. Bonnita Hollow, MD Taking Active   blood glucose meter kit and supplies [id number] Yes Dispense based on patient and insurance preference. Use up to four times daily as directed. (FOR ICD-10 E10.9, E11.9). Bonnita Hollow, MD Taking Active   citalopram (CELEXA) 40 MG tablet [id number] Yes Take 1 tablet (40 mg total) by mouth daily. Bonnita Hollow, MD  Taking Active   dapagliflozin propanediol (FARXIGA) 10 MG TABS tablet [id number] Yes Take 1 tablet (10 mg total) by mouth daily before breakfast. Patient receives via [name] Patient Assistance Bonnita Hollow, MD Taking Active   fluticasone Asencion Islam) 50 MCG/ACT nasal spray [id number] Yes Place 2 sprays into both nostrils daily as needed for allergies or rhinitis. [provider] Taking Active   gabapentin (NEURONTIN) 600 MG tablet [id number] Yes Take 1 tablet (600 mg total) by mouth 2 (two) times daily. Bonnita Hollow, MD Taking Active   glucose blood test strip [id number] Yes Use as instructed Bonnita Hollow, MD Taking Active   ibuprofen (ADVIL) 200 MG tablet [id number] Yes Take 200 mg by mouth every 6 (six) hours as needed for mild pain or headache. [provider] Taking Active   Insulin Pen Needle 32G X 4 MM MISC [id number] Yes Use with insulin once daily  DX R73.03 and Z79.4 [provider] Taking Active   Lancets [place] [id number] Yes 1 each by Does not apply route in the morning, at noon, in the evening, and at bedtime. Bonnita Hollow, MD Taking Active    rosuvastatin (CRESTOR) 10 MG tablet [id number] Yes Take 1 tablet (10 mg total) by mouth at bedtime. Bonnita Hollow, MD Taking Active   Turmeric 500 MG CAPS [id number] No Take 500 mg by mouth daily.  Patient not taking: Reported on 04/13/2022   [provider] Not Taking Active             Patient Active Problem List   Diagnosis Date Noted   Bilateral lower extremity edema 04/18/2022   Excessive daytime sleepiness 01/25/2022   OSA on CPAP 01/25/2022   Non-restorative sleep 01/25/2022   RLS (restless legs syndrome) 01/25/2022   Type 2 diabetes mellitus with hyperglycemia ([place]) 11/23/2021   Hypertension associated with diabetes ([place]) 11/23/2021   Hyperlipidemia associated with type 2 diabetes mellitus ([place]) 11/23/2021   Tremor 11/23/2021   OSA (obstructive sleep apnea) 11/23/2021   Swelling of both hands 11/23/2021   S/P conversion right knee 01/18/2017   There is no immunization history on file for this patient.  Conditions to be addressed/monitored:  Hypertension, Hyperlipidemia, Diabetes, Anxiety, Osteoarthritis, and Restless Legs Syndrome   Care Plan : General Pharmacy (Adult)  Updates made by Germaine Pomfret, RPH since 05/11/2022 12:00 AM     Problem: Hypertension, Hyperlipidemia, Diabetes, Anxiety, Osteoarthritis, and Restless Legs Syndrome   Priority: High     Long-Range Goal: Patient-Specific Goal   Start Date: 12/17/2021  Expected End Date: 12/18/2022  This Visit's Progress: On track  Recent Progress: On track  Priority: High  Note:   Current Barriers:  Unable to independently afford treatment regimen Unable to achieve control of Diabetes   Pharmacist Clinical Goal(s):  Patient will verbalize ability to afford treatment regimen achieve control of diabetes as evidenced by A1c less than 7% through collaboration with PharmD and provider.   Interventions: 1:1 collaboration with Bonnita Hollow, MD regarding development and update of comprehensive  plan of care as evidenced by provider attestation and co-signature Inter-disciplinary care team collaboration (see longitudinal plan of care) Comprehensive medication review performed; medication list updated in electronic medical record  Hypertension (BP goal <130/80) -Controlled -Current treatment: Amlodipine 5 mg daily: Appropriate, Effective, Safe, Accessible -Medications previously tried: NA  -Current home readings: Does not monitor routinely at home.  -Denies hypotensive/hypertensive symptoms -Recommended to continue current medication  Hyperlipidemia: (LDL goal < 70) -Controlled -Current treatment: Rosuvastatin 10  mg nightly: Appropriate, Effective, Safe, Accessible  -Medications previously tried: NA  -Recommended to continue current medication  Diabetes (A1c goal <7%) -Uncontrolled, but improving.  -Diagnosed in 30s  -Current medications: Farxiga 10 mg daily: Appropriate, Query Effective -Medications previously tried: Metformin (GI), Tyler Aas  -Current home glucose readings: checking at bedtime  130-150s   -Denies hypoglycemia symptoms.  -Current meal patterns: Working with dietician  -Current exercise: Walking 3x daily  -Continue current medications  Anxiety (Goal: Maintain remission of symptoms) -Controlled -Current treatment: Citalopram 40 mg daily: Appropriate, Effective, Query Safe -Medications previously tried/failed: NA -GAD7: NA -Recommended to continue current medication  Chronic Pain (Goal: Minimiez pain) -Uncontrolled -Osteoarthritis in knees, neuropathy in legs, possible rheumatologic illness in hands.  -Current treatment  Gabapentin 600 mg twice daily: Appropriate, Effective, Safe, Accessible  Also using for restless legs/anxiety  -Medications previously tried: NA   -Continues to struggle with swelling and stiffness in hands. Referred to rheumatology, but not scheduled until Feb 2024.  -Recommended to continue current medication  Patient  Goals/Self-Care Activities Patient will:  - check glucose daily before breakfast, document, and provide at future appointments check blood pressure weekly, document, and provide at future appointments  Follow Up Plan: Telephone follow up appointment with care management team member scheduled for:  08/12/2022 at 8:30 AM     Medication Assistance:  [name] obtained through [name] medication assistance program.  Enrollment ends Dec 2023  Compliance/Adherence/Medication fill history: Care Gaps: COVID-19 Vaccine Foot Exam Ophthalmology Exam Urine Microalbumin Hepatitis C Screening Influenza Vaccine  Star-Rating Drugs: Metformin 500 mg last filled on 10/01/2021 for 90 day supply at [facility]. Rosuvastatin 10 mg last filled on 11/23/2021 for 90 day supply at [facility].  Patient's preferred pharmacy is:  [name] - [place], [place] - [address] [place] [address] Phone: [phone] Fax: [place], [place]. [place] [address] Phone: [phone] Fax: [phone]  Uses pill box? No Pt endorses 1000% compliance  Patient decided to: Utilize [name] for medication synchronization, packaging and delivery  Care Plan and Follow Up Patient Decision:  Patient agrees to Care Plan and Follow-up.  Plan: Telephone follow up appointment with care management team member scheduled for:  08/12/2022 at 8:30 AM  [name], PharmD, Para [place], CPP Clinical Pharmacist Practitioner  [facility]  [phone]

## 2022-05-03 NOTE — Telephone Encounter (Signed)
Pt want to cancel gastro appt and like you to do a referral  for colonguard

## 2022-05-11 NOTE — Patient Instructions (Signed)
Visit Information It was great speaking with you today!  Please let me know if you have any questions about our visit.   Goals Addressed   None     Patient Care Plan: General Pharmacy (Adult)     Problem Identified: Hypertension, Hyperlipidemia, Diabetes, Anxiety, Osteoarthritis, and Restless Legs Syndrome   Priority: High     Long-Range Goal: Patient-Specific Goal   Start Date: 12/17/2021  Expected End Date: 12/18/2022  This Visit's Progress: On track  Recent Progress: On track  Priority: High  Note:   Current Barriers:  Unable to independently afford treatment regimen Unable to achieve control of Diabetes   Pharmacist Clinical Goal(s):  Patient will verbalize ability to afford treatment regimen achieve control of diabetes as evidenced by A1c less than 7% through collaboration with PharmD and provider.   Interventions: 1:1 collaboration with [name], MD regarding development and update of comprehensive plan of care as evidenced by provider attestation and co-signature Inter-disciplinary care team collaboration (see longitudinal plan of care) Comprehensive medication review performed; medication list updated in electronic medical record  Hypertension (BP goal <130/80) -Controlled -Current treatment: Amlodipine 5 mg daily: Appropriate, Effective, Safe, Accessible -Medications previously tried: NA  -Current home readings: Does not monitor routinely at home.  -Denies hypotensive/hypertensive symptoms -Recommended to continue current medication  Hyperlipidemia: (LDL goal < 70) -Controlled -Current treatment: Rosuvastatin 10 mg nightly: Appropriate, Effective, Safe, Accessible  -Medications previously tried: NA  -Recommended to continue current medication  Diabetes (A1c goal <7%) -Uncontrolled, but improving.  -Diagnosed in 30s  -Current medications: Farxiga 10 mg daily: Appropriate, Query Effective -Medications previously tried: Metformin (GI), Evaristo Bury   -Current home glucose readings: checking at bedtime  130-150s   -Denies hypoglycemia symptoms.  -Current meal patterns: Working with dietician  -Current exercise: Walking 3x daily  -Continue current medications  Anxiety (Goal: Maintain remission of symptoms) -Controlled -Current treatment: Citalopram 40 mg daily: Appropriate, Effective, Query Safe -Medications previously tried/failed: NA -GAD7: NA -Recommended to continue current medication  Chronic Pain (Goal: Minimiez pain) -Uncontrolled -Osteoarthritis in knees, neuropathy in legs, possible rheumatologic illness in hands.  -Current treatment  Gabapentin 600 mg twice daily: Appropriate, Effective, Safe, Accessible  Also using for restless legs/anxiety  -Medications previously tried: NA   -Continues to struggle with swelling and stiffness in hands. Referred to rheumatology, but not scheduled until Feb 2024.  -Recommended to continue current medication  Patient Goals/Self-Care Activities Patient will:  - check glucose daily before breakfast, document, and provide at future appointments check blood pressure weekly, document, and provide at future appointments  Follow Up Plan: Telephone follow up appointment with care management team member scheduled for:  08/12/2022 at 8:30 AM    Patient agreed to services and verbal consent obtained.   Patient verbalizes understanding of instructions and care plan provided today and agrees to view in MyChart. Active MyChart status and patient understanding of how to access instructions and care plan via MyChart confirmed with patient.     [name], PharmD, P[place]y [place], CPP Clinical Pharmacist Practitioner  [facility]  [phone]

## 2022-05-16 DIAGNOSIS — I10 Essential (primary) hypertension: Secondary | ICD-10-CM

## 2022-05-16 DIAGNOSIS — E1159 Type 2 diabetes mellitus with other circulatory complications: Secondary | ICD-10-CM

## 2022-05-16 DIAGNOSIS — Z7984 Long term (current) use of oral hypoglycemic drugs: Secondary | ICD-10-CM

## 2022-05-19 DIAGNOSIS — G4733 Obstructive sleep apnea (adult) (pediatric): Secondary | ICD-10-CM | POA: Diagnosis not present

## 2022-05-25 ENCOUNTER — Encounter: Payer: PRIVATE HEALTH INSURANCE | Admitting: Gastroenterology

## 2022-06-01 ENCOUNTER — Telehealth: Payer: Self-pay

## 2022-06-01 DIAGNOSIS — E1165 Type 2 diabetes mellitus with hyperglycemia: Secondary | ICD-10-CM

## 2022-06-01 NOTE — Progress Notes (Signed)
Care Management & Coordination Services Pharmacy Team  Reason for Encounter: Medication coordination and delivery  Contacted patient on 06/01/2022 to discuss medications   Recent office visits:  None ID  Recent consult visits:  None ID  Hospital visits:  None in previous 6 months  Medications: Outpatient Encounter Medications as of 06/01/2022  Medication Sig   amLODipine (NORVASC) 5 MG tablet Take 1 tablet (5 mg total) by mouth daily.   blood glucose meter kit and supplies Dispense based on patient and insurance preference. Use up to four times daily as directed. (FOR ICD-10 E10.9, E11.9).   citalopram (CELEXA) 40 MG tablet Take 1 tablet (40 mg total) by mouth daily.   dapagliflozin propanediol (FARXIGA) 10 MG TABS tablet Take 1 tablet (10 mg total) by mouth daily before breakfast. Patient receives via [name] Patient Assistance   fluticasone (FLONASE) 50 MCG/ACT nasal spray Place 2 sprays into both nostrils daily as needed for allergies or rhinitis.   gabapentin (NEURONTIN) 600 MG tablet Take 1 tablet (600 mg total) by mouth 2 (two) times daily.   glucose blood test strip Use as instructed   ibuprofen (ADVIL) 200 MG tablet Take 200 mg by mouth every 6 (six) hours as needed for mild pain or headache.   Insulin Pen Needle 32G X 4 MM MISC Use with insulin once daily  DX R73.03 and Z79.4   Lancets MISC 1 each by Does not apply route in the morning, at noon, in the evening, and at bedtime.   rosuvastatin (CRESTOR) 10 MG tablet Take 1 tablet (10 mg total) by mouth at bedtime.   Turmeric 500 MG CAPS Take 500 mg by mouth daily. (Patient not taking: Reported on 04/13/2022)   No facility-administered encounter medications on file as of 06/01/2022.   BP Readings from Last 3 Encounters:  04/13/22 134/82  01/25/22 124/82  01/11/22 136/82    Pulse Readings from Last 3 Encounters:  04/13/22 75  01/25/22 86  01/11/22 92    Lab Results  Component Value Date/Time   HGBA1C 7.3 (A) 04/13/2022  10:15 AM   HGBA1C 7.9 (H) 11/30/2021 11:30 AM   HGBA1C 6.7 (H) 11/29/2016 10:57 AM   Lab Results  Component Value Date   CREATININE 0.73 11/14/2021   BUN 12 11/14/2021   GFRNONAA >60 11/14/2021   GFRAA >60 01/19/2017   NA 139 11/14/2021   K 4.5 11/14/2021   CALCIUM 9.9 11/14/2021   CO2 23 11/14/2021   Care Gaps: Hepatitis C Screening Shingrix Vaccine Dtap Vaccine COVID-19 Vaccine   Star Rating Drugs: Farxiga 5 mg - receives from [name] Rosuvastatin 10 mg last filled on 04/05/2022 for 30 day supply at [facility].  Last adherence delivery date:05/11/2022      Patient is due for next adherence delivery on: 06/10/2022  Spoke with patient on 06/01/2022 reviewed medications and coordinated delivery.  This delivery to include: Adherence Packaging  30 Days  Gabapentin 600 mg 1 tablet two time daily - Breakfast, Bedtime Amlodipine 5 mg 1 tablet daily - Breakfast Citalopram 40 mg 1 tablet daily - Breakfast Rosuvastatin 10 mg 1 tablet daily - Bedtime  Patient declined the following medications this month: Lancets Misc- Adequate Supply Glucose test strips - (patient checks blood sugar once daily)- Adequate Supply. Farxiga 10 mg - receives patient assistance from [name]   No refill request needed.  Confirmed delivery date of 06/10/2022 ([facility]), advised patient that pharmacy will contact them the morning of delivery.   Any concerns about your medications?  No  How often do you forget or accidentally miss a dose? Never  Do you use a pillbox? No  Is patient in packaging Yes   Recent blood pressure readings are as follows:None ID  Recent blood glucose readings are as follows:None ID   Cycle dispensing form sent to Daron Offer for review.   [place] Pharmacist Assistant [phone]

## 2022-06-03 ENCOUNTER — Ambulatory Visit: Payer: Medicare Other | Admitting: Adult Health

## 2022-06-08 NOTE — Progress Notes (Signed)
Office Visit Note  Patient: Kelli Ruiz             Date of Birth: 1955-09-01           MRN: [id number]             PCP: [name], MD Referring: [name], MD Visit Date: 06/22/2022 Occupation: @GUAROCC @  Subjective:   Swelling in multiple joints  History of Present Illness: Kelli Ruiz is a 67 y.o. female in consultation per request of her PCP.  According the patient in 2015 she fell at work and landed on her right knee.  She states she had a small cut and she went for physical therapy but she continued to have pain and discomfort.  In 2016 she underwent right partial knee replacement.  As her pain persist she underwent right total knee replacement in 2018.  She continues to have some discomfort in her both knees.  She states for the last 1-1/2 years she has been experiencing pain and swelling in her bilateral hands which is progressively getting worse.  She has difficulty opening jars and bottles.  She has difficulty gripping objects.  She also has some generalized achiness in her shoulders, elbows, wrists, left trochanter, her knee joints.  None of the other joints are swollen.  She also has some achiness in her neck and lower back.  She has been told that she has degenerative disc disease in the lumbar spine.  There is no history of psoriasis.  There is no family history of autoimmune disease.  She is gravida 0.    Activities of Daily Living:  Patient reports morning stiffness for 15 minutes.   Patient Denies nocturnal pain.  Difficulty dressing/grooming: Denies Difficulty climbing stairs: Reports Difficulty getting out of chair: Reports Difficulty using hands for taps, buttons, cutlery, and/or writing: Reports  Review of Systems  Constitutional: Negative.  Negative for fatigue.  HENT:  Positive for mouth dryness. Negative for mouth sores.   Eyes: Negative.  Negative for dryness.  Respiratory: Negative.  Negative for shortness of breath.   Cardiovascular:  Negative.  Negative for chest pain and palpitations.  Gastrointestinal: Negative.  Negative for blood in stool, constipation and diarrhea.  Endocrine: Positive for increased urination.  Genitourinary: Negative.  Negative for involuntary urination.  Musculoskeletal:  Positive for joint pain, joint pain, joint swelling and morning stiffness. Negative for gait problem, muscle weakness and muscle tenderness.  Skin: Negative.  Negative for color change, rash, hair loss and sensitivity to sunlight.  Allergic/Immunologic: Negative.  Negative for susceptible to infections.  Neurological: Negative.  Negative for dizziness and headaches.  Hematological: Negative.  Negative for swollen glands.  Psychiatric/Behavioral:  Positive for sleep disturbance. Negative for depressed mood. The patient is not nervous/anxious.     PMFS History:  Patient Active Problem List   Diagnosis Date Noted   Bilateral lower extremity edema 04/18/2022   Excessive daytime sleepiness 01/25/2022   OSA on CPAP 01/25/2022   Non-restorative sleep 01/25/2022   RLS (restless legs syndrome) 01/25/2022   Type 2 diabetes mellitus with hyperglycemia (HCC) 11/23/2021   Hypertension associated with diabetes (HCC) 11/23/2021   Hyperlipidemia associated with type 2 diabetes mellitus (HCC) 11/23/2021   Tremor 11/23/2021   OSA (obstructive sleep apnea) 11/23/2021   Swelling of both hands 11/23/2021   S/P conversion right knee 01/18/2017    Past Medical History:  Diagnosis Date   Arthritis    Complication of anesthesia    " they  had a little bit of a hard time waking me after they took a nerve out of my leg"    Depression    Diabetes mellitus without complication ([place])    Hypertension    Neuromuscular disorder ([place])    Obese 01/19/2017   RLS (restless legs syndrome)    Sleep apnea     History reviewed. No pertinent family history. Past Surgical History:  Procedure Laterality Date   BREAST EXCISIONAL BIOPSY Left    CONVERSION  TO TOTAL KNEE Right 01/18/2017   Procedure: 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;  Surgeon: Paralee Cancel, MD;  Location: [facility];  Service: Orthopedics;  Laterality: Right;  90 mins with regional block   DILATION AND CURETTAGE OF UTERUS  12- 2014 or 2015   TONSILLECTOMY     unicompartmental knee  2016   Social History   Social History Narrative   Not on file    There is no immunization history on file for this patient.   Objective: Vital Signs: BP 113/71 (BP Location: Right Arm, Patient Position: Sitting, Cuff Size: Normal)   Pulse 75   Ht 5\' 5"  (1.651 m)   Wt 158 lb 9.6 oz (71.9 kg)   BMI 26.39 kg/m    Physical Exam Vitals and nursing note reviewed.  Constitutional:      Appearance: She is well-developed.  HENT:     Head: Normocephalic and atraumatic.  Eyes:     Conjunctiva/sclera: Conjunctivae normal.  Cardiovascular:     Rate and Rhythm: Normal rate and regular rhythm.     Heart sounds: Normal heart sounds.  Pulmonary:     Effort: Pulmonary effort is normal.     Breath sounds: Normal breath sounds.  Abdominal:     General: Bowel sounds are normal.     Palpations: Abdomen is soft.  Musculoskeletal:     Cervical back: Normal range of motion.  Lymphadenopathy:     Cervical: No cervical adenopathy.  Skin:    General: Skin is warm and dry.     Capillary Refill: Capillary refill takes less than 2 seconds.  Neurological:     Mental Status: She is alert and oriented to person, place, and time.  Psychiatric:        Behavior: Behavior normal.      Musculoskeletal Exam: She had limited lateral rotation of the cervical spine.  Discomfort range of motion of the lumbar spine.  Shoulder joints and elbow joints in good range of motion.  She had tenderness over bilateral wrist joints.  She has synovitis over MCP joints as described below.  She had bilateral PIP and DIP thickening.  Hip joints were in good range of motion.  She had  tenderness over left trochanteric bursa.  She had right knee joint replaced with some discomfort on range of motion.  She had discomfort range of motion of left knee joint without any warmth swelling or effusion.  There was no tenderness over ankles or MTP joints.  CDAI Exam: CDAI Score: 11  Patient Global: 5 mm; Provider Global: 5 mm Swollen: 3 ; Tender: 8  Joint Exam 06/22/2022      Right  Left  Wrist   Tender   Tender  MCP 1  Swollen Tender     MCP 3  Swollen Tender  Swollen Tender  Cervical Spine   Tender     Knee   Tender   Tender     Investigation: No additional findings.  Imaging: No  results found.  Recent Labs: Lab Results  Component Value Date   WBC 7.6 11/14/2021   HGB 14.8 11/14/2021   PLT 273 11/14/2021   NA 139 11/14/2021   K 4.5 11/14/2021   CL 105 11/14/2021   CO2 23 11/14/2021   GLUCOSE 165 (H) 11/14/2021   BUN 12 11/14/2021   CREATININE 0.73 11/14/2021   BILITOT 0.9 11/14/2021   ALKPHOS 60 11/14/2021   AST 28 11/14/2021   ALT 25 11/14/2021   PROT 8.0 11/14/2021   ALBUMIN 4.2 11/14/2021   CALCIUM 9.9 11/14/2021   GFRAA >60 01/19/2017    Speciality Comments: No specialty comments available.  Procedures:  No procedures performed Allergies: Patient has no known allergies.   Assessment / Plan:     Visit Diagnoses: Rheumatoid arthritis of multiple sites with negative rheumatoid factor (HCC) -patient has symmetrical inflammatory arthritis involving bilateral hands with synovitis as described above.  Clinical findings are consistent with rheumatoid arthritis.  Her serology was negative.  Sedimentation rate was elevated.  She had incomplete fist formation on examination today.  She has been also experiencing neck a stiffness.  She has discomfort and achiness in other joints but no synovitis was noted.  Detailed counsel regarding rheumatoid arthritis was provided.  Different treatment options and their side effects were discussed.  I would avoid prednisone  bridging therapy as she is diabetic.  After discussing different treatment options and their side effects we decided to proceed with hydroxychloroquine.  Based on her height her dose will be hydroxychloroquine 200 mg p.o. twice daily Monday to Friday.  I will also refer her to ophthalmology for baseline examination and then annual eye examination to monitor for ocular toxicity.  Plan: Ambulatory referral to Ophthalmology  Patient was counseled on the purpose, proper use, and adverse effects of hydroxychloroquine including nausea/diarrhea, skin rash, headaches, and sun sensitivity.  Advised patient to wear sunscreen once starting hydroxychloroquine to reduce risk of rash associated with sun sensitivity.  Discussed importance of annual eye exams while on hydroxychloroquine to monitor to ocular toxicity and discussed importance of frequent laboratory monitoring.  Provided patient with eye exam form for baseline ophthalmologic exam.  Reviewed risk for QTC prolongation when used in combination with other QTc prolonging agents (including but not limited to antiarrhythmics, macrolide antibiotics, flouroquinolones, tricyclic antidepressants, citalopram, specific antipsychotics, ondansetron, migraine triptans, and methadone). Provided patient with educational materials on hydroxychloroquine and answered all questions.  Patient consented to hydroxychloroquine. Will upload consent in the media tab.    Dose will be Plaquenil 200 mg twice daily Monday through Friday.  Prescription pending lab results.   High risk medication use - Plan: CBC with Differential/Platelet, COMPLETE METABOLIC PANEL WITH GFR, Glucose 6 phosphate dehydrogenase, Ambulatory referral to Ophthalmology.  Information for immunization was placed in the AVS.  Elevated sed rate - 11/30/21: RF<10, anti-CCP 5, ESR 39, CRP<1, uric acid 5.2, ANA negative  Pain in both hands -she had synovitis in bilateral hands involving MCP joints as described above.  Plan:  XR Hand 2 View Right, XR Hand 2 View Left.  X-rays of bilateral hands showed juxta-articular osteopenia, narrowing of the MCP joints, PIP and DIP joints consistent with osteoarthritis and rheumatoid arthritis overlap.  Pain in both feet -she has discomfort in her feet but no synovitis was noted.  Plan: XR Foot 2 Views Right, XR Foot 2 Views Left.  X-rays showed degenerative changes consistent with osteoarthritis.  Status post revision of total knee, right -she had partial knee replacement  in 2016 and then revision in 01/2017.  She continues to have pain and discomfort in her right knee joint.  Neck pain -she has a stiffness and limited range of motion of the cervical spine.  She denies any radiculopathy.  Plan: XR Cervical Spine 2 or 3 views.  Degenerative disc disease and facet joint arthropathy with most significant narrowing between C5-C6 and C6-C7 was noted.  DDD (degenerative disc disease), lumbar-she gives history of chronic pain and is comfort in the lumbar spine.  X-rays from February 20, 2021 of lumbar spine were reviewed which showed multilevel spondylosis and facet joint hypertrophy.  Other fatigue -she gives history of fatigue.  Plan: CK  Other medical problems are listed as follows:  Hypertension associated with diabetes ([place])  Hyperlipidemia associated with type 2 diabetes mellitus ([place])  Type 2 diabetes mellitus with hyperglycemia, without long-term current use of insulin (HCC)  Bilateral lower extremity edema  RLS (restless legs syndrome)  OSA on CPAP  Non-restorative sleep  Orders: Orders Placed This Encounter  Procedures   XR Foot 2 Views Right   XR Foot 2 Views Left   XR Hand 2 View Right   XR Hand 2 View Left   XR Cervical Spine 2 or 3 views   CBC with Differential/Platelet   COMPLETE METABOLIC PANEL WITH GFR   CK   Glucose 6 phosphate dehydrogenase   Ambulatory referral to Ophthalmology   No orders of the defined types were placed in this  encounter.    Follow-Up Instructions: Return for Rheumatoid arthritis, Osteoarthritis.   Bo Merino, MD  Note - This record has been created using Editor, commissioning.  Chart creation errors have been sought, but may not always  have been located. Such creation errors do not reflect on  the standard of medical care.,

## 2022-06-14 ENCOUNTER — Ambulatory Visit: Payer: PRIVATE HEALTH INSURANCE | Admitting: Dietician

## 2022-06-14 NOTE — Progress Notes (Unsigned)
[facility] [address] [place]. [place] [address] [phone]       OFFICE FOLLOW UP NOTE  [name] Date of Birth:  1955-05-18 Medical Record Number:  [id number]   Reason for visit: Initial CPAP follow-up    SUBJECTIVE:   CHIEF COMPLAINT:  No chief complaint on file.   HPI:    Update 06/15/2022 JM: Patient returns for initial CPAP compliance visit.  Completed HST 02/23/2022 which showed severe sleep apnea with total AHI 38.1/h and recommended initiation of AutoPap, started on 11/3.      Consult visit 01/25/2022 Dr. Brett Fairy: Kelli Ruiz is a 67 y.o. year old White or Caucasian female patient seen here as a referral on 01/25/2022 from her PCP  for a transfer of OSA / CPAP care. .  Chief concern according to patient :  Pt states that she was dg with osa in 1990. She has not had a PSG in 7-9 years and feels that there have been changes in her sleep. She is needing a new provider and to establish care for treatment of OSA. She is using a RSMED S 78 - and had a last sleep study with Dr Laney Pastor, in [place].     Kelli Ruiz  has a past medical history of Arthritis, Complication of anesthesia, Depression, Diabetes mellitus without complication ([place]), Hypertension, Obese (01/19/2017), RLS (restless legs syndrome), and Sleep apnea.     Sleep relevant medical history: see above     Family medical /sleep history: nephew with OSA.    Social history:  Patient is retired from [employer] , had a fall in 07-2013, since then she  had 5 knee surgeries, was not able to return.   she lives in a household with brother in Sports coach and sister- dog and cat-  The patient used to work in shifts( day/ Presenter, broadcasting,) Tobacco use- vaping -quit all in 2022- ETOH use : none,  Caffeine intake in form of Coffee( in AM ) Soda( /) Tea ( hot) and energy drinks. Regular exercise in form of PT, dog walking.         Sleep habits are as follows: The patient's dinner time is between  6-7 PM. The patient goes to bed at 10 PM and does read, falls asleep at 11.30-12. She continues to sleep for 2 hours, wakes for  bathroom breaks, from pain , and by her dog- the first time at 2-3 AM.  The preferred sleep position is supine, with the support of 2 pillows.  Dreams are reportedly rare.  7.30  AM is the usual rise time. The patient wakes up around 7 Am , the dog wakes her  She reports not feeling refreshed or restored in AM, with symptoms such as dry mouth, morning headaches, and residual fatigue.  Naps are taken frequently, in PM- lasting from 45 to 120 minutes and are more refreshing but is affecting  nocturnal sleep.    I was able to get a download for the compliance for the compliance for the compliance for Ms. the residual AHI on a 12 cm set CPAP was 1.9/h average user time 8 hours 9 minutes each night and her compliance was 97%.  This is a VPAP adapt [place] that has not been produced in over 7 years.     ROS:   14 system review of systems performed and negative with exception of those listed in HPI  PMH:  Past Medical History:  Diagnosis Date  Arthritis    Complication of anesthesia    " they had a little bit of a hard time waking me after they took a nerve out of my leg"    Depression    Diabetes mellitus without complication ([place])    Hypertension    Neuromuscular disorder ([place])    Obese 01/19/2017   RLS (restless legs syndrome)    Sleep apnea     PSH:  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;  Surgeon: Paralee Cancel, MD;  Location: [facility];  Service: Orthopedics;  Laterality: Right;  90 mins with regional block   DILATION AND CURETTAGE OF UTERUS  12- 2014 or 2015   TONSILLECTOMY     unicompartmental knee  2016    Social History:  Social History   Socioeconomic History   Marital  status: Single    Spouse name: Not on file   Number of children: Not on file   Years of education: Not on file   Highest education level: Not on file  Occupational History   Not on file  Tobacco Use   Smoking status: Former    Years: 20.00    Types: Cigarettes    Quit date: 01/09/2017    Years since quitting: 5.4   Smokeless tobacco: Never   Tobacco comments:    Pt uses vapor  Vaping Use   Vaping Use: Some days  Substance and Sexual Activity   Alcohol use: No   Drug use: No   Sexual activity: Not on file  Other Topics Concern   Not on file  Social History Narrative   Not on file   Social Determinants of Health   Financial Resource Strain: Low Risk  (03/09/2022)   Overall Financial Resource Strain (CARDIA)    Difficulty of Paying Living Expenses: Not hard at all  Recent Concern: Financial Resource Strain - High Risk (12/17/2021)   Overall Financial Resource Strain (CARDIA)    Difficulty of Paying Living Expenses: Hard  Food Insecurity: No Food Insecurity (03/09/2022)   Hunger Vital Sign    Worried About Running Out of Food in the Last Year: Never true    Ran Out of Food in the Last Year: Never true  Transportation Needs: No Transportation Needs (03/09/2022)   PRAPARE - Hydrologist (Medical): No    Lack of Transportation (Non-Medical): No  Physical Activity: Sufficiently Active (03/09/2022)   Exercise Vital Sign    Days of Exercise per Week: 5 days    Minutes of Exercise per Session: 30 min  Stress: No Stress Concern Present (03/09/2022)   [place]    Feeling of Stress : Not at all  Social Connections: Socially Isolated (03/09/2022)   Social Connection and Isolation Panel [NHANES]    Frequency of Communication with Friends and Family: More than three times a week    Frequency of Social Gatherings with Friends and Family: More than three times a week    Attends Religious  Services: Never    Marine scientist or Organizations: No    Attends Archivist Meetings: Never    Marital Status: Never married  Intimate Partner Violence: Not At Risk (03/09/2022)   Humiliation, Afraid, Rape, and Kick questionnaire    Fear of Current or Ex-Partner: No  Emotionally Abused: No    Physically Abused: No    Sexually Abused: No    Family History: No family history on file.  Medications:   Current Outpatient Medications on File Prior to Visit  Medication Sig Dispense Refill   amLODipine (NORVASC) 5 MG tablet Take 1 tablet (5 mg total) by mouth daily. 90 tablet 1   blood glucose meter kit and supplies Dispense based on patient and insurance preference. Use up to four times daily as directed. (FOR ICD-10 E10.9, E11.9). 1 each 0   citalopram (CELEXA) 40 MG tablet Take 1 tablet (40 mg total) by mouth daily. 90 tablet 1   dapagliflozin propanediol (FARXIGA) 10 MG TABS tablet Take 1 tablet (10 mg total) by mouth daily before breakfast. Patient receives via [name] Patient Assistance 90 tablet 3   fluticasone (FLONASE) 50 MCG/ACT nasal spray Place 2 sprays into both nostrils daily as needed for allergies or rhinitis.     gabapentin (NEURONTIN) 600 MG tablet Take 1 tablet (600 mg total) by mouth 2 (two) times daily. 180 tablet 1   glucose blood test strip Use as instructed 100 each 12   ibuprofen (ADVIL) 200 MG tablet Take 200 mg by mouth every 6 (six) hours as needed for mild pain or headache.     Insulin Pen Needle 32G X 4 MM MISC Use with insulin once daily  DX R73.03 and Z79.4     Lancets MISC 1 each by Does not apply route in the morning, at noon, in the evening, and at bedtime. 100 each 12   rosuvastatin (CRESTOR) 10 MG tablet Take 1 tablet (10 mg total) by mouth at bedtime. 90 tablet 3   Turmeric 500 MG CAPS Take 500 mg by mouth daily. (Patient not taking: Reported on 04/13/2022)     No current facility-administered medications on file prior to visit.     Allergies:  No Known Allergies    OBJECTIVE:  Physical Exam  There were no vitals filed for this visit. There is no height or weight on file to calculate BMI. No results found.   General: well developed, well nourished, seated, in no evident distress Head: head normocephalic and atraumatic.   Neck: supple with no carotid or supraclavicular bruits Cardiovascular: regular rate and rhythm, no murmurs Musculoskeletal: no deformity Skin:  no rash/petichiae Vascular:  Normal pulses all extremities   Neurologic Exam Mental Status: Awake and fully alert. Oriented to place and time. Recent and remote memory intact. Attention span, concentration and fund of knowledge appropriate. Mood and affect appropriate.  Cranial Nerves: Pupils equal, briskly reactive to light. Extraocular movements full without nystagmus. Visual fields full to confrontation. Hearing intact. Facial sensation intact. Face, tongue, palate moves normally and symmetrically.  Motor: Normal bulk and tone. Normal strength in all tested extremity muscles Sensory.: intact to touch , pinprick , position and vibratory sensation.  Coordination: Rapid alternating movements normal in all extremities. Finger-to-nose and heel-to-shin performed accurately bilaterally. Gait and Station: Arises from chair without difficulty. Stance is normal. Gait demonstrates normal stride length and balance without use of AD. Tandem walk and heel toe without difficulty.  Reflexes: 1+ and symmetric. Toes downgoing.         ASSESSMENT/PLAN: Kelli Ruiz is a 67 y.o. year old female    OSA on CPAP : Compliance report shows satisfactory usage with optimal residual AHI.  Discussed continued nightly usage with ensuring greater than 4 hours nightly for optimal benefit and per insurance purposes.  Continue to  follow with DME company for any needed supplies or CPAP related concerns     Follow up in *** or call earlier if needed   CC:  PCP:  [name], MD    I spent *** minutes of face-to-face and non-face-to-face time with patient.  This included previsit chart review, lab review, study review, order entry, electronic health record documentation, patient education regarding diagnosis of sleep apnea with review and discussion of compliance report and answered all other questions to patient's satisfaction   Ihor Austin, [facility]  [facility] [address] [place], [place] [address]  Phone [phone] Fax [phone] Note: This document was prepared with digital dictation and possible smart phrase technology. Any transcriptional errors that result from this process are unintentional.

## 2022-06-15 ENCOUNTER — Encounter: Payer: Self-pay | Admitting: Adult Health

## 2022-06-15 ENCOUNTER — Ambulatory Visit: Payer: Medicare Other | Admitting: Adult Health

## 2022-06-15 VITALS — BP 107/64 | HR 73 | Ht 64.0 in | Wt 159.0 lb

## 2022-06-15 DIAGNOSIS — G4733 Obstructive sleep apnea (adult) (pediatric): Secondary | ICD-10-CM | POA: Diagnosis not present

## 2022-06-16 ENCOUNTER — Telehealth: Payer: Self-pay

## 2022-06-19 DIAGNOSIS — G4733 Obstructive sleep apnea (adult) (pediatric): Secondary | ICD-10-CM | POA: Diagnosis not present

## 2022-06-22 ENCOUNTER — Encounter: Payer: Self-pay | Admitting: Rheumatology

## 2022-06-22 ENCOUNTER — Ambulatory Visit: Payer: Medicare Other

## 2022-06-22 ENCOUNTER — Ambulatory Visit (INDEPENDENT_AMBULATORY_CARE_PROVIDER_SITE_OTHER): Payer: Medicare Other

## 2022-06-22 ENCOUNTER — Ambulatory Visit: Payer: Medicare Other | Attending: Rheumatology | Admitting: Rheumatology

## 2022-06-22 VITALS — BP 113/71 | HR 75 | Ht 65.0 in | Wt 158.6 lb

## 2022-06-22 DIAGNOSIS — M79641 Pain in right hand: Secondary | ICD-10-CM

## 2022-06-22 DIAGNOSIS — R5383 Other fatigue: Secondary | ICD-10-CM | POA: Diagnosis not present

## 2022-06-22 DIAGNOSIS — Z96651 Presence of right artificial knee joint: Secondary | ICD-10-CM | POA: Diagnosis not present

## 2022-06-22 DIAGNOSIS — Z79899 Other long term (current) drug therapy: Secondary | ICD-10-CM

## 2022-06-22 DIAGNOSIS — M79642 Pain in left hand: Secondary | ICD-10-CM

## 2022-06-22 DIAGNOSIS — E1165 Type 2 diabetes mellitus with hyperglycemia: Secondary | ICD-10-CM

## 2022-06-22 DIAGNOSIS — M79671 Pain in right foot: Secondary | ICD-10-CM

## 2022-06-22 DIAGNOSIS — E1159 Type 2 diabetes mellitus with other circulatory complications: Secondary | ICD-10-CM | POA: Diagnosis not present

## 2022-06-22 DIAGNOSIS — R600 Localized edema: Secondary | ICD-10-CM

## 2022-06-22 DIAGNOSIS — M542 Cervicalgia: Secondary | ICD-10-CM

## 2022-06-22 DIAGNOSIS — M79672 Pain in left foot: Secondary | ICD-10-CM

## 2022-06-22 DIAGNOSIS — M5136 Other intervertebral disc degeneration, lumbar region: Secondary | ICD-10-CM

## 2022-06-22 DIAGNOSIS — M0609 Rheumatoid arthritis without rheumatoid factor, multiple sites: Secondary | ICD-10-CM

## 2022-06-22 DIAGNOSIS — E1169 Type 2 diabetes mellitus with other specified complication: Secondary | ICD-10-CM

## 2022-06-22 DIAGNOSIS — I152 Hypertension secondary to endocrine disorders: Secondary | ICD-10-CM

## 2022-06-22 DIAGNOSIS — E785 Hyperlipidemia, unspecified: Secondary | ICD-10-CM

## 2022-06-22 DIAGNOSIS — G478 Other sleep disorders: Secondary | ICD-10-CM

## 2022-06-22 DIAGNOSIS — M1990 Unspecified osteoarthritis, unspecified site: Secondary | ICD-10-CM

## 2022-06-22 DIAGNOSIS — G4733 Obstructive sleep apnea (adult) (pediatric): Secondary | ICD-10-CM

## 2022-06-22 DIAGNOSIS — R700 Elevated erythrocyte sedimentation rate: Secondary | ICD-10-CM

## 2022-06-22 DIAGNOSIS — G2581 Restless legs syndrome: Secondary | ICD-10-CM

## 2022-06-22 NOTE — Progress Notes (Signed)
Pharmacy Note  Subjective: Patient presents today to [facility] for follow up office visit.   Patient seen by the pharmacist for counseling on hydroxychloroquine for rheumatoid arthritis.  She is hesitant to take any medications due to side effects  Objective: CMP     Component Value Date/Time   NA 139 11/14/2021 1149   K 4.5 11/14/2021 1149   CL 105 11/14/2021 1149   CO2 23 11/14/2021 1149   GLUCOSE 165 (H) 11/14/2021 1149   BUN 12 11/14/2021 1149   CREATININE 0.73 11/14/2021 1149   CALCIUM 9.9 11/14/2021 1149   PROT 8.0 11/14/2021 1149   ALBUMIN 4.2 11/14/2021 1149   AST 28 11/14/2021 1149   ALT 25 11/14/2021 1149   ALKPHOS 60 11/14/2021 1149   BILITOT 0.9 11/14/2021 1149   GFRNONAA >60 11/14/2021 1149   GFRAA >60 01/19/2017 0453    CBC    Component Value Date/Time   WBC 7.6 11/14/2021 1149   RBC 4.90 11/14/2021 1149   HGB 14.8 11/14/2021 1149   HCT 44.0 11/14/2021 1149   PLT 273 11/14/2021 1149   MCV 89.8 11/14/2021 1149   MCH 30.2 11/14/2021 1149   MCHC 33.6 11/14/2021 1149   RDW 13.2 11/14/2021 1149    Assessment/Plan: Patient was counseled on the purpose, proper use, and adverse effects of hydroxychloroquine including nausea/diarrhea, skin rash, headaches, and sun sensitivity.  Advised patient to wear sunscreen once starting hydroxychloroquine to reduce risk of rash associated with sun sensitivity.  Discussed importance of annual eye exams while on hydroxychloroquine to monitor to ocular toxicity and discussed importance of frequent laboratory monitoring.  Provided patient with eye exam form for baseline ophthalmologic exam.  Reviewed risk for QTC prolongation when used in combination with other QTc prolonging agents (including but not limited to antiarrhythmics, macrolide antibiotics, flouroquinolones, tricyclic antidepressants, citalopram, specific antipsychotics, ondansetron, migraine triptans, and methadone). Patient is on citalopram - Qtc in 2018  wnl. Reviewed this with patient and that we are okay to proceed with hydroxychloroquine.  Provided patient with educational materials on hydroxychloroquine and answered all questions.  Patient consented to hydroxychloroquine. Will upload consent in the media tab.    Dose will be Plaquenil 200 mg twice daily Monday through Friday.  Prescription pending lab results from today.  Knox Saliva, PharmD, MPH, BCPS, CPP Clinical Pharmacist (Rheumatology and Pulmonology)

## 2022-06-22 NOTE — Patient Instructions (Signed)
Rheumatoid Arthritis Rheumatoid arthritis (RA) is a long-term (chronic) disease. RA causes inflammation in your joints. Your joints may feel painful, stiff, swollen, and warm. RA may start slowly. It most often affects the small joints of the hands and feet. It can also affect other parts of the body. Symptoms of RA often come and go. There is no cure for RA, but medicines can help your symptoms. What are the causes? RA is an autoimmune disease. This means that your body's defense system (immune system) attacks healthy parts of your body by mistake. The exact cause of RA is not known. What increases the risk? Being female. Having a family history of RA or other diseases like RA. Smoking. Being very overweight (obese). Being exposed to pollutants or chemicals. What are the signs or symptoms? Symptoms start slowly. They are often worse in the morning. The first symptom is often morning stiffness that lasts longer than 30 minutes. As RA gets worse, symptoms may include: Pain, stiffness, swelling, warmth, and tenderness in joints on both sides of your body. Loss of energy. Not wanting to eat as much as normal. Weight loss. A low fever. Dry eyes and a dry mouth. Firm lumps that grow under your skin. Changes in the way your joints look or the way they work. Symptoms vary and they often come and go. Symptoms sometimes get worse for a period of time. These are called flares. How is this treated? Treatment may include: Taking good care of yourself. Be sure to rest as needed, eat a healthy diet, and exercise. Medicines. These may include: Pain relievers. Medicines to help with inflammation. Disease-modifying antirheumatic drugs (DMARDs). Medicines called biologic response modifiers. Physical therapy and occupational therapy. Surgery, if joint damage is very bad. Your doctor will work with you to find the best treatments. Follow these instructions at home: Managing pain, stiffness, and  swelling If told, put heat on the affected area. Do this as often as told by your doctor. Use the heat source that your doctor recommends, such as a moist heat pack or a heating pad. Place a towel between your skin and the heat source. Leave the heat on for 20-30 minutes. Take off the heat if your skin turns bright red. This is very important. If you cannot feel pain, heat, or cold, you have a greater risk of getting burned.  Activity Return to your normal activities when your doctor says that it is safe. Rest when you have a flare. Exercise as told by your doctor. This can help your joints move better and get stronger. General instructions Take over-the-counter and prescription medicines only as told by your doctor. Keep all follow-up visits. Where to find more information [employer] of Rheumatology: rheumatology.[place]: arthritis.org Contact a doctor if: You have a flare. You have a fever. You have problems because of your medicines. Get help right away if: You have chest pain. You have trouble breathing. You get a hot, painful joint all of a sudden, and it is worse than your normal joint aches. These symptoms may be an emergency. Get help right away. Call 911. Do not wait to see if the symptoms will go away. Do not drive yourself to the hospital. Summary RA is a long-term disease. RA causes inflammation in your joints. Symptoms of RA start slowly. They are often worse in the morning. This information is not intended to replace advice given to you by your health care provider. Make sure you discuss any questions you have with  your health care provider. Document Revised: 03/05/2021 Document Reviewed: 03/05/2021 Elsevier Patient Education  [place]. Hydroxychloroquine Tablets What is this medication? HYDROXYCHLOROQUINE (hye drox ee KLOR oh kwin) treats autoimmune conditions, such as rheumatoid arthritis and lupus. It works by slowing down an  overactive immune system. It may also be used to prevent and treat malaria. It works by killing the parasite that causes malaria. It belongs to a group of medications called DMARDs. This medicine may be used for other purposes; ask your health care provider or pharmacist if you have questions. COMMON BRAND NAME(S): Plaquenil, Quineprox What should I tell my care team before I take this medication? They need to know if you have any of these conditions: Diabetes Eye disease, vision problems Frequently drink alcohol G6PD deficiency Heart disease Irregular heartbeat or rhythm Kidney disease Liver disease Porphyria Psoriasis An unusual or allergic reaction to hydroxychloroquine, other medications, foods, dyes, or preservatives Pregnant or trying to get pregnant Breastfeeding How should I use this medication? Take this medication by mouth with water. Take it as directed on the prescription label. Do not cut, crush, or chew this medication. Swallow the tablets whole. Take it with food. Do not take it more than directed. Take all of this medication unless your care team tells you to stop it early. Keep taking it even if you think you are better. Take products with antacids in them at a different time of day than this medication. Take this medication 4 hours before or 4 hours after antacids. Talk to your care team if you have questions. Talk to your care team about the use of this medication in children. While this medication may be prescribed for selected conditions, precautions do apply. Overdosage: If you think you have taken too much of this medicine contact a poison control center or emergency room at once. NOTE: This medicine is only for you. Do not share this medicine with others. What if I miss a dose? If you miss a dose, take it as soon as you can. If it is almost time for your next dose, take only that dose. Do not take double or extra doses. What may interact with this medication? Do not  take this medication with any of the following: Cisapride Dronedarone Pimozide Thioridazine This medication may also interact with the following: Ampicillin Antacids Cimetidine Cyclosporine Digoxin Kaolin Medications for diabetes, such as insulin, glipizide, glyburide Medications for seizures, such as carbamazepine, phenobarbital, phenytoin Mefloquine Methotrexate Other medications that cause heart rhythm changes Praziquantel This list may not describe all possible interactions. Give your health care provider a list of all the medicines, herbs, non-prescription drugs, or dietary supplements you use. Also tell them if you smoke, drink alcohol, or use illegal drugs. Some items may interact with your medicine. What should I watch for while using this medication? Visit your care team for regular checks on your progress. Tell your care team if your symptoms do not start to get better or if they get worse. You may need blood work done while you are taking this medication. If you take other medications that can affect heart rhythm, you may need more testing. Talk to your care team if you have questions. Your vision may be tested before and during use of this medication. Tell your care team right away if you have any change in your eyesight. This medication may cause serious skin reactions. They can happen weeks to months after starting the medication. Contact your care team right away if you  notice fevers or flu-like symptoms with a rash. The rash may be red or purple and then turn into blisters or peeling of the skin. Or, you might notice a red rash with swelling of the face, lips or lymph nodes in your neck or under your arms. If you or your family notice any changes in your behavior, such as new or worsening depression, thoughts of harming yourself, anxiety, or other unusual or disturbing thoughts, or memory loss, call your care team right away. What side effects may I notice from receiving this  medication? Side effects that you should report to your care team as soon as possible: Allergic reactions--skin rash, itching, hives, swelling of the face, lips, tongue, or throat Aplastic anemia--unusual weakness or fatigue, dizziness, headache, trouble breathing, increased bleeding or bruising Change in vision Heart rhythm changes--fast or irregular heartbeat, dizziness, feeling faint or lightheaded, chest pain, trouble breathing Infection--fever, chills, cough, or sore throat Low blood sugar (hypoglycemia)--tremors or shaking, anxiety, sweating, cold or clammy skin, confusion, dizziness, rapid heartbeat Muscle injury--unusual weakness or fatigue, muscle pain, dark yellow or brown urine, decrease in amount of urine Pain, tingling, or numbness in the hands or feet Rash, fever, and swollen lymph nodes Redness, blistering, peeling, or loosening of the skin, including inside the mouth Thoughts of suicide or self-harm, worsening mood, or feelings of depression Unusual bruising or bleeding Side effects that usually do not require medical attention (report to your care team if they continue or are bothersome): Diarrhea Headache Nausea Stomach pain Vomiting This list may not describe all possible side effects. Call your doctor for medical advice about side effects. You may report side effects to FDA at 1-800-FDA-1088. Where should I keep my medication? Keep out of the reach of children and pets. Store at room temperature up to 30 degrees C (86 degrees F). Protect from light. Get rid of any unused medication after the expiration date. To get rid of medications that are no longer needed or have expired: Take the medication to a medication take-back program. Check with your pharmacy or law enforcement to find a location. If you cannot return the medication, check the label or package insert to see if the medication should be thrown out in the garbage or flushed down the toilet. If you are not sure,  ask your care team. If it is safe to put it in the trash, empty the medication out of the container. Mix the medication with cat litter, dirt, coffee grounds, or other unwanted substance. Seal the mixture in a bag or container. Put it in the trash. NOTE: This sheet is a summary. It may not cover all possible information. If you have questions about this medicine, talk to your doctor, pharmacist, or health care provider.  2023 Elsevier/Gold Standard (2004-07-14 00:00:00) Standing Labs We placed an order today for your standing lab work.   Please have your standing labs drawn in 1 month, 3 months and then every 5 months  Please have your labs drawn 2 weeks prior to your appointment so that the provider can discuss your lab results at your appointment.  Please note that you may see your imaging and lab results in [place] before we have reviewed them. We will contact you once all results are reviewed. Please allow our office up to 72 hours to thoroughly review all of the results before contacting the office for clarification of your results.  Lab hours are:   Monday through Thursday from 8:00 am -12:30 pm and 1:00 pm-5:00  pm and Friday from 8:00 am-12:00 pm.  Please be advised, all patients with office appointments requiring lab work will take precedent over walk-in lab work.   Labs are drawn by [facility]. Please bring your co-pay at the time of your lab draw.  You may receive a bill from [place] [facility] for your lab work.  Please note if you are on Hydroxychloroquine and and an order has been placed for a Hydroxychloroquine level, you will need to have it drawn 4 hours or more after your last dose.  If you wish to have your labs drawn at another location, please call the office 24 hours in advance so we can fax the orders.  The office is located at [address] [place], [place], [place] [address] No appointment is necessary.    If you have any questions regarding directions or hours of operation,   please call [phone].   As a reminder, please drink plenty of water prior to coming for your lab work. Thanks!   Vaccines You are taking a medication(s) that can suppress your immune system.  The following immunizations are recommended: Flu annually Covid-19  RSV Td/Tdap (tetanus, diphtheria, pertussis) every 10 years Pneumonia (Prevnar 15 then Pneumovax 23 at least 1 year apart.  Alternatively, can take Prevnar 20 without needing additional dose) Shingrix: 2 doses from 4 weeks to 6 months apart  Please check with your PCP to make sure you are up to date.

## 2022-06-23 ENCOUNTER — Other Ambulatory Visit: Payer: Self-pay

## 2022-06-23 MED ORDER — HYDROXYCHLOROQUINE SULFATE 200 MG PO TABS: ORAL_TABLET | ORAL | 2 refills | Status: DC

## 2022-06-23 NOTE — Progress Notes (Signed)
Glucose elevated at 177.  LFTs are elevated.  Patient should avoid all NSAID use.  CBC is normal.  CK is normal.  Please send a prescription for hydroxychloroquine 200 mg p.o. twice daily Monday to Friday.  Please forward the labs to her PCP.

## 2022-06-23 NOTE — Progress Notes (Signed)
Per lab note on 06/22/2022: Glucose elevated at 177.  LFTs are elevated.  Patient should avoid all NSAID use.  CBC is normal.  CK is normal.  Please send a prescription for hydroxychloroquine 200 mg p.o. twice daily Monday to Friday.  Please forward the labs to her PCP.   Please review and sign pended PLQ rx. Thanks!

## 2022-06-24 ENCOUNTER — Telehealth: Payer: Self-pay | Admitting: Family Medicine

## 2022-06-24 DIAGNOSIS — G4733 Obstructive sleep apnea (adult) (pediatric): Secondary | ICD-10-CM | POA: Diagnosis not present

## 2022-06-24 NOTE — Telephone Encounter (Signed)
Pt called in wanting a cb from Dr Grandville Silos for feeling light headed especially when she looks up, with no other symptoms. I transferred her over to nurse triage.

## 2022-06-24 NOTE — Telephone Encounter (Signed)
FYI

## 2022-06-25 ENCOUNTER — Emergency Department (HOSPITAL_COMMUNITY): Payer: Medicare Other

## 2022-06-25 ENCOUNTER — Encounter (HOSPITAL_COMMUNITY): Payer: Self-pay

## 2022-06-25 ENCOUNTER — Emergency Department (HOSPITAL_COMMUNITY)
Admission: EM | Admit: 2022-06-25 | Discharge: 2022-06-25 | Disposition: A | Discharge status: Home / Self Care | Payer: Medicare Other | Attending: Emergency Medicine | Admitting: Emergency Medicine

## 2022-06-25 DIAGNOSIS — R42 Dizziness and giddiness: Secondary | ICD-10-CM | POA: Insufficient documentation

## 2022-06-25 DIAGNOSIS — R531 Weakness: Secondary | ICD-10-CM | POA: Diagnosis not present

## 2022-06-25 DIAGNOSIS — R251 Tremor, unspecified: Secondary | ICD-10-CM | POA: Diagnosis not present

## 2022-06-25 DIAGNOSIS — Z20822 Contact with and (suspected) exposure to covid-19: Secondary | ICD-10-CM | POA: Insufficient documentation

## 2022-06-25 DIAGNOSIS — R55 Syncope and collapse: Secondary | ICD-10-CM | POA: Diagnosis not present

## 2022-06-25 DIAGNOSIS — R9082 White matter disease, unspecified: Secondary | ICD-10-CM | POA: Diagnosis not present

## 2022-06-25 LAB — CBC WITH DIFFERENTIAL/PLATELET
Abs Immature Granulocytes: 0.02 10*3/uL (ref 0.00–0.07)
Basophils Absolute: 0 10*3/uL (ref 0.0–0.1)
Basophils Relative: 1 %
Eosinophils Absolute: 0.1 10*3/uL (ref 0.0–0.5)
Eosinophils Relative: 2 %
HCT: 50.4 % — ABNORMAL HIGH (ref 36.0–46.0)
Hemoglobin: 16.9 g/dL — ABNORMAL HIGH (ref 12.0–15.0)
Immature Granulocytes: 0 %
Lymphocytes Relative: 41 %
Lymphs Abs: 2.6 10*3/uL (ref 0.7–4.0)
MCH: 30.3 pg (ref 26.0–34.0)
MCHC: 33.5 g/dL (ref 30.0–36.0)
MCV: 90.5 fL (ref 80.0–100.0)
Monocytes Absolute: 0.5 10*3/uL (ref 0.1–1.0)
Monocytes Relative: 7 %
Neutro Abs: 3.2 10*3/uL (ref 1.7–7.7)
Neutrophils Relative %: 49 %
Platelets: 267 10*3/uL (ref 150–400)
RBC: 5.57 MIL/uL — ABNORMAL HIGH (ref 3.87–5.11)
RDW: 13.5 % (ref 11.5–15.5)
WBC: 6.5 10*3/uL (ref 4.0–10.5)
nRBC: 0 % (ref 0.0–0.2)

## 2022-06-25 LAB — COMPREHENSIVE METABOLIC PANEL
ALT: 42 U/L (ref 0–44)
AST: 37 U/L (ref 15–41)
Albumin: 4.5 g/dL (ref 3.5–5.0)
Alkaline Phosphatase: 62 U/L (ref 38–126)
Anion gap: 11 (ref 5–15)
BUN: 15 mg/dL (ref 8–23)
CO2: 26 mmol/L (ref 22–32)
Calcium: 9.7 mg/dL (ref 8.9–10.3)
Chloride: 102 mmol/L (ref 98–111)
Creatinine, Ser: 0.92 mg/dL (ref 0.44–1.00)
GFR, Estimated: >60 mL/min (ref >60)
Glucose, Bld: 114 mg/dL — ABNORMAL HIGH (ref 70–99)
Potassium: 4.1 mmol/L (ref 3.5–5.1)
Sodium: 139 mmol/L (ref 135–145)
Total Bilirubin: 0.8 mg/dL (ref 0.3–1.2)
Total Protein: 8.6 g/dL — ABNORMAL HIGH (ref 6.5–8.1)

## 2022-06-25 LAB — URINALYSIS, ROUTINE W REFLEX MICROSCOPIC
Bilirubin Urine: NEGATIVE
Glucose, UA: >=500 mg/dL — AB
Hgb urine dipstick: NEGATIVE
Ketones, ur: 5 mg/dL — AB
Nitrite: NEGATIVE
Protein, ur: NEGATIVE mg/dL
Specific Gravity, Urine: 1.014 (ref 1.005–1.030)
pH: 5 (ref 5.0–8.0)

## 2022-06-25 LAB — RESP PANEL BY RT-PCR (RSV, FLU A&B, COVID)  RVPGX2
Influenza A by PCR: NEGATIVE
Influenza B by PCR: NEGATIVE
Resp Syncytial Virus by PCR: NEGATIVE
SARS Coronavirus 2 by RT PCR: NEGATIVE

## 2022-06-25 LAB — TSH: TSH: 1.847 u[IU]/mL (ref 0.350–4.500)

## 2022-06-25 LAB — CBG MONITORING, ED: Glucose-Capillary: 126 mg/dL — ABNORMAL HIGH (ref 70–99)

## 2022-06-25 NOTE — ED Triage Notes (Signed)
Pt states she has tremors and has been feeling "woozy" and felt like she was going to pass out. This has been going on since Tuesday.

## 2022-06-25 NOTE — Discharge Instructions (Signed)
Today there is no acute findings, for your weakness, lightheadedness, you were provided with a walker to help with your stability, and then also encouraged to follow-up with your neurologist.  If you have worsening weakness, fall, become confused please return to the ER.  If you have any loss of bowel or bladder please return to the ER as well.

## 2022-06-25 NOTE — ED Provider Notes (Signed)
[place] EMERGENCY DEPARTMENT AT [facility] Provider Note   CSN: [id number] Arrival date & time: 06/25/22  1357     History  Chief Complaint  Patient presents with   Tremors    Kelli Ruiz is a 67 y.o. female, history of rheumatoid arthritis, tremor, who presents to the ED secondary to feeling woozy and like she is going to pass out for the last 4 days.  She states that she had a rheumatologist appointment on Monday, and started to get woozy, and lightheaded, has felt like she is about to pass out, and has almost fallen several times.  She states that she has been clutching onto furniture to keep yourself up, and has just felt like she is going to fall.  Denies any chest pain, shortness of breath on exertion, nausea, vomiting, abdominal pain.  Just feels weak, and just not right.  Also states that she has a tremor that is noted back, but has been getting worse.  Tremor is at rest, and when doing things.  Called her neurologist, and was told to come in today to the ER as her neurologist is unable to see her today.    Home Medications Prior to Admission medications   Medication Sig Start Date End Date Taking? Authorizing Provider  hydroxychloroquine (PLAQUENIL) 200 MG tablet Take 200mg  by mouth twice daily, Monday through Friday only. None on Saturday or Sunday. 06/23/22   Pollyann Savoy, MD  amLODipine (NORVASC) 5 MG tablet Take 1 tablet (5 mg total) by mouth daily. 12/17/21   [name], MD  blood glucose meter kit and supplies Dispense based on patient and insurance preference. Use up to four times daily as directed. (FOR ICD-10 E10.9, E11.9). 11/23/21   [name], MD  citalopram (CELEXA) 40 MG tablet Take 1 tablet (40 mg total) by mouth daily. 12/17/21   [name], MD  dapagliflozin propanediol (FARXIGA) 10 MG TABS tablet Take 1 tablet (10 mg total) by mouth daily before breakfast. Patient receives via [name] Patient Assistance 01/05/22   [name],  MD  fluticasone [facility]) 50 MCG/ACT nasal spray Place 2 sprays into both nostrils daily as needed for allergies or rhinitis.    [provider]  gabapentin (NEURONTIN) 600 MG tablet Take 1 tablet (600 mg total) by mouth 2 (two) times daily. 12/17/21   [name], MD  glucose blood test strip Use as instructed 03/03/22   [name], MD  ibuprofen (ADVIL) 200 MG tablet Take 200 mg by mouth every 6 (six) hours as needed for mild pain or headache.    [provider]  Insulin Pen Needle 32G X 4 MM MISC Use with insulin once daily  DX R73.03 and Z79.4 08/22/19   [provider]  Lancets MISC 1 each by Does not apply route in the morning, at noon, in the evening, and at bedtime. 03/03/22   [name], MD  rosuvastatin (CRESTOR) 10 MG tablet Take 1 tablet (10 mg total) by mouth at bedtime. 12/17/21 12/12/22  [name], MD  Turmeric 500 MG CAPS Take 500 mg by mouth daily.    [provider]      Allergies    Patient has no known allergies.    Review of Systems   Review of Systems  Neurological:  Positive for tremors, weakness and light-headedness. Negative for dizziness.    Physical Exam Updated Vital Signs BP (!) 145/90 (BP Location: Left Arm)  Pulse 85   Temp 98.1 F (36.7 C) (Oral)   SpO2 96%  Physical Exam Vitals and nursing note reviewed.  Constitutional:      General: She is not in acute distress.    Appearance: She is well-developed.  HENT:     Head: Normocephalic and atraumatic.  Eyes:     Conjunctiva/sclera: Conjunctivae normal.  Cardiovascular:     Rate and Rhythm: Normal rate and regular rhythm.     Heart sounds: No murmur heard. Pulmonary:     Effort: Pulmonary effort is normal. No respiratory distress.     Breath sounds: Normal breath sounds.  Abdominal:     Palpations: Abdomen is soft.     Tenderness: There is no abdominal tenderness.  Musculoskeletal:        General: No swelling.     Cervical back: Neck  supple.  Skin:    General: Skin is warm and dry.     Capillary Refill: Capillary refill takes less than 2 seconds.  Neurological:     Mental Status: She is alert.     Cranial Nerves: Cranial nerves 2-12 are intact.     Sensory: Sensation is intact.     Motor: Tremor present.     Comments: Resting and active tremor.  Psychiatric:        Mood and Affect: Mood normal.     ED Results / Procedures / Treatments   Labs (all labs ordered are listed, but only abnormal results are displayed) Labs Reviewed  CBC WITH DIFFERENTIAL/PLATELET - Abnormal; Notable for the following components:      Result Value   RBC 5.57 (*)    Hemoglobin 16.9 (*)    HCT 50.4 (*)    All other components within normal limits  COMPREHENSIVE METABOLIC PANEL - Abnormal; Notable for the following components:   Glucose, Bld 114 (*)    Total Protein 8.6 (*)    All other components within normal limits  URINALYSIS, ROUTINE W REFLEX MICROSCOPIC - Abnormal; Notable for the following components:   Color, Urine STRAW (*)    Glucose, UA >=500 (*)    Ketones, ur 5 (*)    Leukocytes,Ua TRACE (*)    Bacteria, UA RARE (*)    All other components within normal limits  CBG MONITORING, ED - Abnormal; Notable for the following components:   Glucose-Capillary 126 (*)    All other components within normal limits  RESP PANEL BY RT-PCR (RSV, FLU A&B, COVID)  [id number]  TSH    EKG EKG Interpretation  Date/Time:  Friday June 25 2022 14:48:11 EST Ventricular Rate:  80 PR Interval:  163 QRS Duration: 92 QT Interval:  395 QTC Calculation: 456 R Axis:   59 Text Interpretation: Sinus rhythm Atrial premature complex No significant change was found Confirmed by Glynn Octave [id number]) on 06/25/2022 2:54:00 PM  Radiology CT Head Wo Contrast  Result Date: 06/25/2022 CLINICAL DATA:  Syncope/presyncope. EXAM: CT HEAD WITHOUT CONTRAST TECHNIQUE: Contiguous axial images were obtained from the base of the skull through the vertex  without intravenous contrast. RADIATION DOSE REDUCTION: This exam was performed according to the departmental dose-optimization program which includes automated exposure control, adjustment of the mA and/or kV according to patient size and/or use of iterative reconstruction technique. COMPARISON:  None Available. FINDINGS: Brain: No evidence of acute infarction, hemorrhage, hydrocephalus, extra-axial collection or mass lesion/mass effect. There is mild patchy periventricular and deep white matter hypodensity. Vascular: No hyperdense vessel or unexpected calcification. Skull: Normal. Negative for fracture  or focal lesion. Sinuses/Orbits: No acute finding. Other: None. IMPRESSION: No acute intracranial pathology. Angelo Caroll-vessel white matter disease. Electronically Signed   By: Darliss Cheney M.D.   On: 06/25/2022 16:30    Procedures Procedures    Medications Ordered in ED Medications - No data to display  ED Course/ Medical Decision Making/ A&P                           Medical Decision Making Patient is a 67 year old female, here for increased weakness, and feeling lightheadedness, for the last week.  She has no neurodeficits except for she has a tremor on exam, that is intentional and at rest.  She states that this is not new, but that is getting worse, we will obtain a head CT to evaluate for any old stroke, CBC, CMP, TSH, and urinalysis, and additionally an EKG given the lightheadedness.  She has normal TMs, no evidence of an inner ear infection.  No shortness of breath, chest discomfort.  We will also obtain orthostatics.  Cranial nerves intact.  Amount and/or Complexity of Data Reviewed Labs: ordered.    Details: Unremarkable Radiology: ordered.    Details: Head CT shows white matter disease, no acute findings. ECG/medicine tests:  Decision-making details documented in ED Course. Discussion of management or test interpretation with external provider(s): Discussed with patient, labs  unremarkable, she is well-appearing on exam, and we provided her with a walker for more stabilization.  I encouraged her to follow-up with her primary care doctor and her neurologist particularly as she will need to be further evaluated.  She has no acute findings on my exam, and she is stable for discharge at this time.  I am suspicious that she may have a progressive neurologic disease given her tremor, and should be further evaluated.  She has no difficulty with speech, and is able to ambulate.    Final Clinical Impression(s) / ED Diagnoses Final diagnoses:  Tremor  Weakness    Rx / DC Orders ED Discharge Orders     None         Dolphus Jenny, Harley [place], PA 06/25/22 1759    Wynetta Fines, MD 06/26/22 1200

## 2022-06-26 LAB — COMPLETE METABOLIC PANEL WITH GFR
AG Ratio: 1.4 (calc) (ref 1.0–2.5)
ALT: 43 U/L — ABNORMAL HIGH (ref 6–29)
AST: 36 U/L — ABNORMAL HIGH (ref 10–35)
Albumin: 4.4 g/dL (ref 3.6–5.1)
Alkaline phosphatase (APISO): 65 U/L (ref 37–153)
BUN: 15 mg/dL (ref 7–25)
CO2: 26 mmol/L (ref 20–32)
Calcium: 9.7 mg/dL (ref 8.6–10.4)
Chloride: 105 mmol/L (ref 98–110)
Creat: 1.02 mg/dL (ref 0.50–1.05)
Globulin: 3.2 g/dL (calc) (ref 1.9–3.7)
Glucose, Bld: 177 mg/dL — ABNORMAL HIGH (ref 65–99)
Potassium: 4.6 mmol/L (ref 3.5–5.3)
Sodium: 141 mmol/L (ref 135–146)
Total Bilirubin: 0.4 mg/dL (ref 0.2–1.2)
Total Protein: 7.6 g/dL (ref 6.1–8.1)
eGFR: 60 mL/min/{1.73_m2} (ref >=60)

## 2022-06-26 LAB — CBC WITH DIFFERENTIAL/PLATELET
Absolute Monocytes: 453 cells/uL (ref 200–950)
Basophils Absolute: 62 cells/uL (ref 0–200)
Basophils Relative: 1 %
Eosinophils Absolute: 130 cells/uL (ref 15–500)
Eosinophils Relative: 2.1 %
HCT: 44.8 % (ref 35.0–45.0)
Hemoglobin: 15.2 g/dL (ref 11.7–15.5)
Lymphs Abs: 2362 cells/uL (ref 850–3900)
MCH: 30.2 pg (ref 27.0–33.0)
MCHC: 33.9 g/dL (ref 32.0–36.0)
MCV: 88.9 fL (ref 80.0–100.0)
MPV: 10.2 fL (ref 7.5–12.5)
Monocytes Relative: 7.3 %
Neutro Abs: 3193 cells/uL (ref 1500–7800)
Neutrophils Relative %: 51.5 %
Platelets: 273 10*3/uL (ref 140–400)
RBC: 5.04 10*6/uL (ref 3.80–5.10)
RDW: 13 % (ref 11.0–15.0)
Total Lymphocyte: 38.1 %
WBC: 6.2 10*3/uL (ref 3.8–10.8)

## 2022-06-26 LAB — GLUCOSE 6 PHOSPHATE DEHYDROGENASE: G-6PDH: 11.1 U/g Hgb (ref 7.0–20.5)

## 2022-06-26 LAB — CK: Total CK: 96 U/L (ref 29–143)

## 2022-06-28 ENCOUNTER — Telehealth: Payer: Self-pay

## 2022-06-28 NOTE — Transitions of Care (Post Inpatient/ED Visit) (Signed)
   06/28/2022  Name: Kelli Ruiz MRN: [id number] DOB: July 11, 1955  Today's TOC FU Call Status: EMMI Today's TOC FU Call Status:: Successful TOC FU Call Competed TOC FU Call Complete Date: 06/28/22  Transition Care Management Follow-up Telephone Call Date of Discharge: 06/25/22 Discharge Facility: [facility] Type of Discharge: Emergency Department Reason for ED Visit: Other: (Presyncopal) How have you been since you were released from the hospital?: Same Any questions or concerns?: No Discussed with patient that she should check her CBG reading when she has these episodes and see if there is a correlation.  Encouraged to get in to see her PCP sooner if she is feeling worse. Items Reviewed: Did you receive and understand the discharge instructions provided?: Yes Medications obtained and verified?: Yes (Medications Reviewed) Any new allergies since your discharge?: No Dietary orders reviewed?: No Do you have support at home?: Yes People in Home: sibling(s) Name of Support/Comfort Primary Source: [facility] and Equipment/Supplies: Were [place] Ordered?: No Any new equipment or medical supplies ordered?: No  Functional Questionnaire: Do you need assistance with bathing/showering or dressing?: No Do you need assistance with meal preparation?: No Do you need assistance with eating?: No Do you have difficulty maintaining continence: No Do you need assistance with getting out of bed/getting out of a chair/moving?: No Do you have difficulty managing or taking your medications?: No  Folllow up appointments reviewed: PCP Follow-up appointment confirmed?: Yes Date of PCP follow-up appointment?: 07/19/22 Follow-up Provider: Dr. Grandville Silos Specialist [facility] Follow-up appointment confirmed?: NA Do you need transportation to your follow-up appointment?: No Do you understand care options if your condition(s) worsen?: Yes-patient verbalized understanding  SDOH Interventions Today     Flowsheet Row Most Recent Value  SDOH Interventions   Housing Interventions Intervention Not Indicated  Transportation Interventions Intervention Not Indicated      Johnney Killian, RN, BSN, CCM Care Management Coordinator [facility] Phone: [phone]: [phone]

## 2022-06-29 ENCOUNTER — Telehealth: Payer: Self-pay

## 2022-06-29 NOTE — Progress Notes (Deleted)
Office Visit Note  Patient: Kelli Ruiz             Date of Birth: [birth year]           MRN: [id number]             PCP: Bonnita Hollow, MD Referring: Bonnita Hollow, MD Visit Date: 07/13/2022 Occupation: @GUAROCC @  Subjective:  No chief complaint on file.   History of Present Illness: Kelli Ruiz is a 67 y.o. female ***with seronegative rheumatoid arthritis and osteoarthritis overlap.  Patient was placed on hydroxychloroquine on June 23, 2022.  She was seen in the emergency room on June 25, 2022 with history of dizziness starting 4 days ago.  She described presyncopal episodes.  She also noted that her tremors were getting worse.  Her neurologist advised her to go to the emergency room.  In the emergency room she had EKG which according to the reading did not show any significant change.    Activities of Daily Living:  Patient reports morning stiffness for *** {minute/hour:19697}.   Patient {ACTIONS;DENIES/REPORTS:21021675::"Denies"} nocturnal pain.  Difficulty dressing/grooming: {ACTIONS;DENIES/REPORTS:21021675::"Denies"} Difficulty climbing stairs: {ACTIONS;DENIES/REPORTS:21021675::"Denies"} Difficulty getting out of chair: {ACTIONS;DENIES/REPORTS:21021675::"Denies"} Difficulty using hands for taps, buttons, cutlery, and/or writing: {ACTIONS;DENIES/REPORTS:21021675::"Denies"}  No Rheumatology ROS completed.   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   Objective: Vital Signs: There were no vitals taken for this visit.   Physical Exam   Musculoskeletal Exam: ***  CDAI Exam: CDAI Score: -- Patient Global: --; Provider Global: -- Swollen: --; Tender: -- Joint Exam 07/13/2022   No joint exam has been documented for this visit   There is currently no information documented on the homunculus. Go to the Rheumatology activity and complete the homunculus joint exam.  Investigation: No additional findings.  Imaging: CT Head Wo Contrast  Result Date:  06/25/2022 CLINICAL DATA:  Syncope/presyncope. EXAM: CT HEAD WITHOUT CONTRAST TECHNIQUE: Contiguous axial images were obtained from the base of the skull through the vertex without intravenous contrast. RADIATION DOSE REDUCTION: This exam was performed according to the departmental dose-optimization program which includes automated exposure control, adjustment of the mA and/or kV according to patient size and/or use of  iterative reconstruction technique. COMPARISON:  None Available. FINDINGS: Brain: No evidence of acute infarction, hemorrhage, hydrocephalus, extra-axial collection or mass lesion/mass effect. There is mild patchy periventricular and deep white matter hypodensity. Vascular: No hyperdense vessel or unexpected calcification. Skull: Normal. Negative for fracture or focal lesion. Sinuses/Orbits: No acute finding. Other: None. IMPRESSION: No acute intracranial pathology. Small-vessel white matter disease. Electronically Signed   By: Ronney Asters M.D.   On: 06/25/2022 16:30   XR Foot 2 Views Left  Result Date: 06/22/2022 First MTP, PIP and DIP narrowing was noted.  No intertarsal, tibiotalar or subtalar joint space narrowing was noted.  Plantar and calcaneal spurs were noted.  No erosive changes were noted. Impression: These findings are consistent with osteoarthritis of the foot.  XR Foot 2 Views Right  Result Date: 06/22/2022 First MTP, PIP and DIP narrowing was noted.  No intertarsal, tibiotalar or subtalar joint space narrowing was noted.  Plantar and calcaneal spurs were noted.  No erosive changes were noted. Impression: These findings are consistent with osteoarthritis of the foot.  XR Cervical Spine 2 or 3 views  Result Date: 06/22/2022 Anterior osteophytes are noted.  Multilevel spondylosis with most severe narrowing between C5-C6 and C6-C7 was noted.  Facet joint arthropathy was noted. Impression: These findings most consistent with degenerative disease of cervical spine and facet joint arthropathy.  XR Hand 2 View Left  Result Date: 06/22/2022 Severe CMC PIP and DIP narrowing was noted.  Third MCP narrowing was noted.  No intercarpal radiocarpal joint space narrowing was noted.  No erosive changes were noted.  Juxta-articular osteopenia was noted. Impression: These findings are consistent with inflammatory arthritis and osteoarthritis overlap.  XR Hand 2 View Right  Result Date:  06/22/2022 Juxta-articular osteopenia was noted.  Severe narrowing and subluxation of right first MCP joint was noted.  Narrowing of third MCP joint was noted.  No intercarpal or radiocarpal joint space narrowing was noted.  PIP and DIP narrowing was noted. Impression: These findings are consistent with inflammatory arthritis and osteoarthritis overlap.   Recent Labs: Lab Results  Component Value Date   WBC 6.5 06/25/2022   HGB 16.9 (H) 06/25/2022   PLT 267 06/25/2022   NA 139 06/25/2022   K 4.1 06/25/2022   CL 102 06/25/2022   CO2 26 06/25/2022   GLUCOSE 114 (H) 06/25/2022   BUN 15 06/25/2022   CREATININE 0.92 06/25/2022   BILITOT 0.8 06/25/2022   ALKPHOS 62 06/25/2022   AST 37 06/25/2022   ALT 42 06/25/2022   PROT 8.6 (H) 06/25/2022   ALBUMIN 4.5 06/25/2022   CALCIUM 9.7 06/25/2022   GFRAA >60 01/19/2017   June 22, 2022 CK 96, G6PD normal.  Speciality Comments: No specialty comments available.  Procedures:  No procedures performed Allergies: Patient has no known allergies.   Assessment / Plan:     Visit Diagnoses: No diagnosis found.  Orders: No orders of the defined types were placed in this encounter.  No orders of the defined types were placed in this encounter.   Face-to-face time spent with patient was *** minutes. Greater than 50% of time was spent in counseling and coordination  of care.  Follow-Up Instructions: No follow-ups on file.   Bo Merino, MD  Note - This record has been created using Editor, commissioning.  Chart creation errors have been sought, but may not always  have been located. Such creation errors do not reflect on  the standard of medical care.

## 2022-06-29 NOTE — Telephone Encounter (Signed)
Spoke with pt and denies dizziness today. Patient is aware of annotation below and verbalized understanding.

## 2022-06-29 NOTE — Progress Notes (Signed)
Care Management & Coordination Services Pharmacy Team  Reason for Encounter: Medication coordination and delivery  Contacted patient to discuss medications and coordinate delivery from [name].  Spoke with patient on 06/29/2022   [facility]ycle dispensing form sent to Kelli Ruiz for review.   Last adherence delivery date:06/10/2022      Patient is due for next adherence delivery on: 07/09/2022  This delivery to include: Adherence Packaging  30 Days  Gabapentin 600 mg 1 tablet two time daily - Breakfast, Bedtime Amlodipine 5 mg 1 tablet daily - Breakfast Citalopram 40 mg 1 tablet daily - Breakfast Rosuvastatin 10 mg 1 tablet daily - Bedtime Accu-Check Lancets Misc  Accu-Check Glucose test strips   Patient declined the following medications this month: Farxiga 10 mg - receives patient assistance from [name]   No refill request needed.  Confirmed delivery date of 07/09/2022 (first route), advised patient that pharmacy will contact them the morning of delivery.   Any concerns about your medications? No  How often do you forget or accidentally miss a dose? Never  Do you use a pillbox? No  Is patient in packaging Yes    Recent blood pressure readings are as follows:None ID  Recent blood glucose readings are as follows:None ID  Care Gaps: Hepatitis C Screening Shingrix Vaccine Dtap Vaccine COVID-19 Vaccine   Star Rating Drugs: Farxiga 5 mg - receives from [name] Rosuvastatin 10 mg last filled on 06/07/2022 for 30 day supply at [facility].  Chart review: Recent office visits:  None ID  Recent consult visits:  06/22/2022 Dr.Deveshwar MD (Rheumatology) Start hydroxychloroquine 200 mg p.o. twice daily Monday to Friday  06/15/2022 Frann Rider NP (Neurology) No medication changes noted, Follow up in 1 year   Hospital visits:  Medication Reconciliation was completed by comparing discharge summary, patient's EMR and Pharmacy list, and upon discussion with  patient.  Admitted to the hospital on 06/25/2022 due to Tremor. Discharge date was 06/25/2022. Discharged from [facility] Emergency Department at [place]?Medications Started at [facility] Discharge:?? -started None ID  Medication Changes at Hospital Discharge: -Changed None ID  Medications Discontinued at Hospital Discharge: -Stopped None ID  Medications that remain the same after Hospital Discharge:??  -All other medications will remain the same.    Medications: Outpatient Encounter Medications as of 06/29/2022  Medication Sig   hydroxychloroquine (PLAQUENIL) 200 MG tablet Take '200mg'$  by mouth twice daily, Monday through Friday only. None on Saturday or Sunday.   amLODipine (NORVASC) 5 MG tablet Take 1 tablet (5 mg total) by mouth daily.   blood glucose meter kit and supplies Dispense based on patient and insurance preference. Use up to four times daily as directed. (FOR ICD-10 E10.9, E11.9).   citalopram (CELEXA) 40 MG tablet Take 1 tablet (40 mg total) by mouth daily.   dapagliflozin propanediol (FARXIGA) 10 MG TABS tablet Take 1 tablet (10 mg total) by mouth daily before breakfast. Patient receives via [name] Patient Assistance   fluticasone (FLONASE) 50 MCG/ACT nasal spray Place 2 sprays into both nostrils daily as needed for allergies or rhinitis.   gabapentin (NEURONTIN) 600 MG tablet Take 1 tablet (600 mg total) by mouth 2 (two) times daily.   glucose blood test strip Use as instructed   ibuprofen (ADVIL) 200 MG tablet Take 200 mg by mouth every 6 (six) hours as needed for mild pain or headache.   Insulin Pen Needle 32G X 4 MM MISC Use with insulin once daily  DX R73.03 and  Z79.4   Lancets MISC 1 each by Does not apply route in the morning, at noon, in the evening, and at bedtime.   rosuvastatin (CRESTOR) 10 MG tablet Take 1 tablet (10 mg total) by mouth at bedtime.   Turmeric 500 MG CAPS Take 500 mg by mouth daily.   No facility-administered encounter  medications on file as of 06/29/2022.   BP Readings from Last 3 Encounters:  06/25/22 (!) 145/90  06/22/22 113/71  06/15/22 107/64    Pulse Readings from Last 3 Encounters:  06/25/22 85  06/22/22 75  06/15/22 73    Lab Results  Component Value Date/Time   HGBA1C 7.3 (A) 04/13/2022 10:15 AM   HGBA1C 7.9 (H) 11/30/2021 11:30 AM   HGBA1C 6.7 (H) 11/29/2016 10:57 AM   Lab Results  Component Value Date   CREATININE 0.92 06/25/2022   BUN 15 06/25/2022   GFRNONAA >60 06/25/2022   GFRAA >60 01/19/2017   NA 139 06/25/2022   K 4.1 06/25/2022   CALCIUM 9.7 06/25/2022   CO2 26 06/25/2022     Bessie Morton Clinical Pharmacist Assistant [phone]

## 2022-07-13 ENCOUNTER — Ambulatory Visit: Payer: PRIVATE HEALTH INSURANCE | Admitting: Rheumatology

## 2022-07-13 ENCOUNTER — Ambulatory Visit: Payer: PRIVATE HEALTH INSURANCE | Admitting: Family Medicine

## 2022-07-13 DIAGNOSIS — G478 Other sleep disorders: Secondary | ICD-10-CM

## 2022-07-13 DIAGNOSIS — M5136 Other intervertebral disc degeneration, lumbar region: Secondary | ICD-10-CM

## 2022-07-13 DIAGNOSIS — R600 Localized edema: Secondary | ICD-10-CM

## 2022-07-13 DIAGNOSIS — Z79899 Other long term (current) drug therapy: Secondary | ICD-10-CM

## 2022-07-13 DIAGNOSIS — R5383 Other fatigue: Secondary | ICD-10-CM

## 2022-07-13 DIAGNOSIS — M79641 Pain in right hand: Secondary | ICD-10-CM

## 2022-07-13 DIAGNOSIS — E1169 Type 2 diabetes mellitus with other specified complication: Secondary | ICD-10-CM

## 2022-07-13 DIAGNOSIS — E1159 Type 2 diabetes mellitus with other circulatory complications: Secondary | ICD-10-CM

## 2022-07-13 DIAGNOSIS — G4733 Obstructive sleep apnea (adult) (pediatric): Secondary | ICD-10-CM

## 2022-07-13 DIAGNOSIS — Z96651 Presence of right artificial knee joint: Secondary | ICD-10-CM

## 2022-07-13 DIAGNOSIS — M79671 Pain in right foot: Secondary | ICD-10-CM

## 2022-07-13 DIAGNOSIS — G2581 Restless legs syndrome: Secondary | ICD-10-CM

## 2022-07-13 DIAGNOSIS — M5030 Other cervical disc degeneration, unspecified cervical region: Secondary | ICD-10-CM

## 2022-07-13 DIAGNOSIS — E1165 Type 2 diabetes mellitus with hyperglycemia: Secondary | ICD-10-CM

## 2022-07-13 DIAGNOSIS — M0609 Rheumatoid arthritis without rheumatoid factor, multiple sites: Secondary | ICD-10-CM

## 2022-07-18 DIAGNOSIS — G4733 Obstructive sleep apnea (adult) (pediatric): Secondary | ICD-10-CM | POA: Diagnosis not present

## 2022-07-19 ENCOUNTER — Encounter: Payer: Self-pay | Admitting: Family Medicine

## 2022-07-19 ENCOUNTER — Ambulatory Visit (INDEPENDENT_AMBULATORY_CARE_PROVIDER_SITE_OTHER): Payer: Medicare Other | Admitting: Family Medicine

## 2022-07-19 VITALS — BP 122/84 | HR 77 | Temp 97.6°F | Wt 157.8 lb

## 2022-07-19 DIAGNOSIS — E1159 Type 2 diabetes mellitus with other circulatory complications: Secondary | ICD-10-CM

## 2022-07-19 DIAGNOSIS — E785 Hyperlipidemia, unspecified: Secondary | ICD-10-CM

## 2022-07-19 DIAGNOSIS — E1165 Type 2 diabetes mellitus with hyperglycemia: Secondary | ICD-10-CM

## 2022-07-19 DIAGNOSIS — M069 Rheumatoid arthritis, unspecified: Secondary | ICD-10-CM | POA: Insufficient documentation

## 2022-07-19 DIAGNOSIS — R42 Dizziness and giddiness: Secondary | ICD-10-CM

## 2022-07-19 DIAGNOSIS — E1169 Type 2 diabetes mellitus with other specified complication: Secondary | ICD-10-CM

## 2022-07-19 DIAGNOSIS — I152 Hypertension secondary to endocrine disorders: Secondary | ICD-10-CM | POA: Diagnosis not present

## 2022-07-19 DIAGNOSIS — R251 Tremor, unspecified: Secondary | ICD-10-CM

## 2022-07-19 DIAGNOSIS — E119 Type 2 diabetes mellitus without complications: Secondary | ICD-10-CM | POA: Diagnosis not present

## 2022-07-19 DIAGNOSIS — G514 Facial myokymia: Secondary | ICD-10-CM | POA: Diagnosis not present

## 2022-07-19 DIAGNOSIS — Z636 Dependent relative needing care at home: Secondary | ICD-10-CM

## 2022-07-19 LAB — POCT GLYCOSYLATED HEMOGLOBIN (HGB A1C): Hemoglobin A1C: 6.7 % — AB (ref 4.0–5.6)

## 2022-07-19 MED ORDER — MECLIZINE HCL 12.5 MG PO TABS: 12.5000 mg | ORAL_TABLET | Freq: Three times a day (TID) | ORAL | 0 refills | Status: DC | PRN

## 2022-07-19 NOTE — Assessment & Plan Note (Signed)
Patient reports occasional tremors, which have improved.  Differential diagnosis: Essential tremor, physiological tremor.  Plan: Continue gabapentin as prescribed and monitor for tremor control. Reassess if symptoms worsen or significantly interfere with daily activities.

## 2022-07-19 NOTE — Progress Notes (Signed)
Assessment/Plan:   Problem List Items Addressed This Visit       Cardiovascular and Mediastinum   Hypertension associated with diabetes ([place])     Endocrine   Diabetes mellitus ([place]) - Primary    Diabetes is well controlled on current medication regimen.  Plan: Continue current antidiabetic medications. Reinforce patient education on diet and exercise. Monitor HbA1c periodically as part of diabetes management.      Relevant Orders   POCT glycosylated hemoglobin (Hb A1C) (Completed)   Hyperlipidemia associated with type 2 diabetes mellitus ([place])     Musculoskeletal and Integument   Rheumatoid arthritis involving multiple sites [facility])    Patient reports reduced, but persistent swelling in the hands.  Plan: Continue current anti-rheumatic medications. Consider follow-up with rheumatology to reassess the disease activity and adjust treatment if necessary.        Other   Tremor    Patient reports occasional tremors, which have improved.  Differential diagnosis: Essential tremor, physiological tremor.  Plan: Continue gabapentin as prescribed and monitor for tremor control. Reassess if symptoms worsen or significantly interfere with daily activities.      Vertigo    Patient experienced a resolved episode of vertigo with no evidence of neurological deficit, stroke, or infection.  Differential diagnosis: Benign paroxysmal positional vertigo (BPPV), vestibular neuritis, Meniere's disease.   Plan: Continue home vestibular exercises as prescribed. Use meclizine as needed for symptomatic relief during vertigo attacks. Monitor and follow-up for recurrence of symptoms.      Relevant Medications   meclizine (ANTIVERT) 12.5 MG tablet   Caregiver stress    Patient is managing significant caregiver stress due to a family member's mental health issues.   Plan:  Offer support and counseling resources for caregivers. Encourage self-care strategies, including taking breaks and  seeking social support.      Relevant Orders   AMB Referral to Chronic Care Management Services   Ocular myokymia    Patient report daily eye spasms without visual impairment.  Differential diagnosis: Ocular myokymia.  Plan: Await assessment by the ophthalmologist for targeted evaluation. Monitor for any change in vision or frequency of spasms.       There are no discontinued medications.    Subjective:  HPI: Encounter date: 07/19/2022  KOURTNIE MONESTIME is a 67 y.o. female who has S/P conversion right knee; Diabetes mellitus ([place]); Hypertension associated with diabetes ([place]); Hyperlipidemia associated with type 2 diabetes mellitus ([place]); Tremor; OSA (obstructive sleep apnea); Swelling of both hands; Excessive daytime sleepiness; OSA on CPAP; Non-restorative sleep; RLS (restless legs syndrome); Bilateral lower extremity edema; Vertigo; Caregiver stress; Ocular myokymia; and Rheumatoid arthritis involving multiple sites [facility]) on their problem list..   She  has a past medical history of Arthritis, Complication of anesthesia, Depression, Diabetes mellitus without complication ([place]), Hypertension, Neuromuscular disorder ([place]), Obese (01/19/2017), RLS (restless legs syndrome), and Sleep apnea..   CHIEF COMPLAINT: Alexya A. Croswell presents for a 68-monthfollow-up for diabetes management and blood pressure control.  HISTORY OF PRESENT ILLNESS:  Vertigo: The patient reports an episode of vertigo approximately one month ago. She described it as a sudden onset of dizziness with a sensation of the room spinning, and associated with falling to the right. No prior history of similar symptoms. After evaluation in emergency, all tests, including CT head and lab work, were normal. Currently, she has no dizziness. Advised to use meclizine as needed for vertigo symptoms and provided with vestibular home exercises.  Tremors: She continues to experience occasional tremors,  mainly noticeable in the morning but  reports they are not as frequent or severe as they were. On gabapentin, which seems to be helping. No change in management indicated at this time.  Eye Spasms: Recently experiencing daily eye spasms, described as a deep feeling in the center of the eye. No associated vision changes or double vision. Referral to ophthalmology is pending.  Diabetes Management: Patient's hemoglobin A1C is 6.7%, which indicates good control of diabetes. She reports frequent urination which she attributes to diabetes medication, dapagliflozin. No signs of infection on recent urinalysis but noted glucose in urine, likely secondary to medication. Management to remain the same.  Rheumatoid Arthritis: On hydroxychloroquine but patient is not currently taking Turmeric. Some hand swelling persists but is less severe than before.  Caregiver Stress: Patient is experiencing caregiver stress associated with the care of a family member with mental health issues. While there is no sign of depression or anxiety exacerbation, stress management and caregiver support resources will be explored.  ROS: Cardiology:  No chest pain or shortness of breath. Neuro: Vertigo sensation which has resolved. Occasional tremors, currently managed on gabapentin. Endocrine: Diabetes is well-controlled on current regimen.     07/19/2022   11:08 AM 04/13/2022   10:55 AM  GAD 7 : Generalized Anxiety Score  Nervous, Anxious, on Edge 0 0  Control/stop worrying 0 0  Worry too much - different things 0 0  Trouble relaxing 0 0  Restless 0 0  Easily annoyed or irritable 0 0  Afraid - awful might happen 0 1  Total GAD 7 Score 0 1  Anxiety Difficulty Not difficult at all Not difficult at all       07/19/2022   11:08 AM 04/13/2022   10:54 AM 02/04/2022    2:05 PM 11/23/2021    2:15 PM  Depression screen PHQ 2/9  Decreased Interest 0 0 0 0  Down, Depressed, Hopeless 0 0 0 0  PHQ - 2 Score 0 0 0 0  Altered sleeping 0 0  0  Tired, decreased energy  0 0  0  Change in appetite 0 1  0  Feeling bad or failure about yourself  0 0  0  Trouble concentrating 0 0  0  Moving slowly or fidgety/restless 0 0  0  Suicidal thoughts 0 0  0  PHQ-9 Score 0 1  0  Difficult doing work/chores Not difficult at all Not difficult at all  Not difficult at all     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 90 tablet 1   blood glucose meter kit and supplies Dispense based on patient and insurance preference. Use up to four times daily as directed. (FOR ICD-10 E10.9, E11.9). 1 each 0   citalopram (CELEXA) 40 MG tablet Take 1 tablet (40 mg total) by mouth daily. 90 tablet 1   dapagliflozin propanediol (FARXIGA) 10 MG TABS tablet Take 1 tablet (  10 mg total) by mouth daily before breakfast. Patient receives via [name] Patient Assistance 90 tablet 3   fluticasone (FLONASE) 50 MCG/ACT nasal spray Place 2 sprays into both nostrils daily as needed for allergies or rhinitis.     gabapentin (NEURONTIN) 600 MG tablet Take 1 tablet (600 mg total) by mouth 2 (two) times daily. 180 tablet 1   glucose blood test strip Use as instructed 100 each 12   ibuprofen (ADVIL) 200 MG tablet Take 200 mg by mouth every 6 (six) hours as needed for mild pain or headache.     Insulin Pen Needle 32G X 4 MM MISC Use with insulin once daily  DX R73.03 and Z79.4     Lancets MISC 1 each by Does not apply route in the morning, at noon, in the evening, and  at bedtime. 100 each 12   rosuvastatin (CRESTOR) 10 MG tablet Take 1 tablet (10 mg total) by mouth at bedtime. 90 tablet 3   hydroxychloroquine (PLAQUENIL) 200 MG tablet Take '200mg'$  by mouth twice daily, Monday through Friday only. None on Saturday or Sunday. (Patient not taking: Reported on 07/19/2022) 40 tablet 2   Turmeric 500 MG CAPS Take 500 mg by mouth daily. (Patient not taking: Reported on 07/19/2022)     No facility-administered medications prior to visit.    No family history on file.  Social History   Socioeconomic History   Marital status: Single    Spouse name: Not on file   Number of children: Not on file   Years of education: Not on file   Highest education level: Not on file  Occupational History   Not on file  Tobacco Use   Smoking status: Former    Years: 20.00    Types: Cigarettes    Quit date: 01/09/2017    Years since quitting: 5.5   Smokeless tobacco: Never   Tobacco comments:    Pt uses vapor  Vaping Use   Vaping Use: Some days  Substance and Sexual Activity   Alcohol use: No   Drug use: No   Sexual activity: Not on file  Other Topics Concern   Not on file  Social History Narrative   Not on file   Social Determinants of Health   Financial Resource Strain: Low Risk  (03/09/2022)   Overall Financial Resource Strain (CARDIA)    Difficulty of Paying Living Expenses: Not hard at all  Recent Concern: Financial Resource Strain - High Risk (12/17/2021)   Overall Financial Resource Strain (CARDIA)    Difficulty of Paying Living Expenses: Hard  Food Insecurity: No Food Insecurity (03/09/2022)   Hunger Vital Sign    Worried About Running Out of Food in the Last Year: Never true    [place] in the Last Year: Never true  Transportation Needs: No Transportation Needs (06/28/2022)   PRAPARE - Hydrologist (Medical): No    Lack of Transportation (Non-Medical): No  Physical Activity: Sufficiently Active (03/09/2022)   Exercise  Vital Sign    Days of Exercise per Week: 5 days    Minutes of Exercise per Session: 30 min  Stress: No Stress Concern Present (03/09/2022)   [place]    Feeling of Stress : Not at all  Social Connections: Socially Isolated (03/09/2022)   Social Connection and Isolation Panel [NHANES]    Frequency of Communication with Friends and Family: More than three times a week  Frequency of Social Gatherings with Friends and Family: More than three times a week    Attends Religious Services: Never    Marine scientist or Organizations: No    Attends Archivist Meetings: Never    Marital Status: Never married  Intimate Partner Violence: Not At Risk (03/09/2022)   Humiliation, Afraid, Rape, and Kick questionnaire    Fear of Current or Ex-Partner: No    Emotionally Abused: No    Physically Abused: No    Sexually Abused: No                                                                                                 Objective:  Physical Exam: BP 122/84 (BP Location: Left Arm, Patient Position: Sitting, Cuff Size: Large)   Pulse 77   Temp 97.6 F (36.4 C) (Temporal)   Wt 157 lb 12.8 oz (71.6 kg)   SpO2 99%   BMI 26.26 kg/m     Physical Exam Constitutional:      General: She is not in acute distress.    Appearance: Normal appearance. She is not ill-appearing or toxic-appearing.  HENT:     Head: Normocephalic.     Right Ear: Tympanic membrane, ear canal and external ear normal. There is no impacted cerumen.     Left Ear: Tympanic membrane, ear canal and external ear normal. There is no impacted cerumen.     Nose: Nose normal. No congestion.     Mouth/Throat:     Mouth: Mucous membranes are moist.     Pharynx: Oropharynx is clear. No oropharyngeal exudate.  Eyes:     General: Lids are normal. No scleral icterus.       Right eye: No discharge.        Left eye: No discharge.     Extraocular  Movements: Extraocular movements intact.     Conjunctiva/sclera: Conjunctivae normal.     Pupils: Pupils are equal, round, and reactive to light.  Cardiovascular:     Rate and Rhythm: Normal rate and regular rhythm.     Pulses: Normal pulses.     Heart sounds: Normal heart sounds.  Pulmonary:     Effort: Pulmonary effort is normal. No respiratory distress.     Breath sounds: Normal breath sounds.  Abdominal:     General: Abdomen is flat. Bowel sounds are normal.     Palpations: Abdomen is soft.  Musculoskeletal:        General: Normal range of motion.     Cervical back: Normal range of motion.  Lymphadenopathy:     Cervical: No cervical adenopathy.  Skin:    General: Skin is warm and dry.     Findings: No rash.  Neurological:     General: No focal deficit present.     Mental Status: She is alert and oriented to person, place, and time. Mental status is at baseline.     Motor: Tremor present.  Psychiatric:        Mood and Affect: Mood normal.        Behavior: Behavior normal.  Thought Content: Thought content normal.        Judgment: Judgment normal.     Recent Results (from the past 2160 hour(s))  CBC with Differential/Platelet     Status: None   Collection Time: 06/22/22 11:22 AM  Result Value Ref Range   WBC 6.2 3.8 - 10.8 Thousand/uL   RBC 5.04 3.80 - 5.10 Million/uL   Hemoglobin 15.2 11.7 - 15.5 g/dL   HCT 44.8 35.0 - 45.0 %   MCV 88.9 80.0 - 100.0 fL   MCH 30.2 27.0 - 33.0 pg   MCHC 33.9 32.0 - 36.0 g/dL   RDW 13.0 11.0 - 15.0 %   Platelets 273 140 - 400 Thousand/uL   MPV 10.2 7.5 - 12.5 fL   Neutro Abs 3,193 1,500 - 7,800 cells/uL   Lymphs Abs 2,362 850 - 3,900 cells/uL   Absolute Monocytes 453 200 - 950 cells/uL   Eosinophils Absolute 130 15 - 500 cells/uL   Basophils Absolute 62 0 - 200 cells/uL   Neutrophils Relative % 51.5 %   Total Lymphocyte 38.1 %   Monocytes Relative 7.3 %   Eosinophils Relative 2.1 %   Basophils Relative 1.0 %  COMPLETE  METABOLIC PANEL WITH GFR     Status: Abnormal   Collection Time: 06/22/22 11:22 AM  Result Value Ref Range   Glucose, Bld 177 (H) 65 - 99 mg/dL    Comment: .            Fasting reference interval . For someone without known diabetes, a glucose value >125 mg/dL indicates that they may have diabetes and this should be confirmed with a follow-up test. .    BUN 15 7 - 25 mg/dL   Creat 1.02 0.50 - 1.05 mg/dL   eGFR 60 > OR = 60 mL/min/1.31m   BUN/Creatinine Ratio SEE NOTE: 6 - 22 (calc)    Comment:    Not Reported: BUN and Creatinine are within    reference range. .    Sodium 141 135 - 146 mmol/L   Potassium 4.6 3.5 - 5.3 mmol/L   Chloride 105 98 - 110 mmol/L   CO2 26 20 - 32 mmol/L   Calcium 9.7 8.6 - 10.4 mg/dL   Total Protein 7.6 6.1 - 8.1 g/dL   Albumin 4.4 3.6 - 5.1 g/dL   Globulin 3.2 1.9 - 3.7 g/dL (calc)   AG Ratio 1.4 1.0 - 2.5 (calc)   Total Bilirubin 0.4 0.2 - 1.2 mg/dL   Alkaline phosphatase (APISO) 65 37 - 153 U/L   AST 36 (H) 10 - 35 U/L   ALT 43 (H) 6 - 29 U/L  CK     Status: None   Collection Time: 06/22/22 11:22 AM  Result Value Ref Range   Total CK 96 29 - 143 U/L  Glucose 6 phosphate dehydrogenase     Status: None   Collection Time: 06/22/22 11:22 AM  Result Value Ref Range   G-6PDH 11.1 7.0 - 20.5 U/g Hgb  Resp panel by RT-PCR (RSV, Flu A&B, Covid) Anterior Nasal Swab     Status: None   Collection Time: 06/25/22  2:43 PM   Specimen: Anterior Nasal Swab  Result Value Ref Range   SARS Coronavirus 2 by RT PCR NEGATIVE NEGATIVE    Comment: (NOTE) SARS-CoV-2 target nucleic acids are NOT DETECTED.  The SARS-CoV-2 RNA is generally detectable in upper respiratory specimens during the acute phase of infection. The lowest concentration of SARS-CoV-2 viral copies this assay  can detect is 138 copies/mL. A negative result does not preclude SARS-Cov-2 infection and should not be used as the sole basis for treatment or other patient management decisions. A  negative result may occur with  improper specimen collection/handling, submission of specimen other than nasopharyngeal swab, presence of viral mutation(s) within the areas targeted by this assay, and inadequate number of viral copies(<138 copies/mL). A negative result must be combined with clinical observations, patient history, and epidemiological information. The expected result is Negative.  Fact Sheet for Patients:  EntrepreneurPulse.com.au  Fact Sheet for Healthcare Providers:  IncredibleEmployment.be  This test is no t yet approved or cleared by the [place] FDA and  has been authorized for detection and/or diagnosis of SARS-CoV-2 by FDA under an Emergency Use Authorization (EUA). This EUA will remain  in effect (meaning this test can be used) for the duration of the COVID-19 declaration under Section 564(b)(1) of the Act, 21 U.S.C.section 360bbb-3(b)(1), unless the authorization is terminated  or revoked sooner.       Influenza A by PCR NEGATIVE NEGATIVE   Influenza B by PCR NEGATIVE NEGATIVE    Comment: (NOTE) The Xpert Xpress SARS-CoV-2/FLU/RSV plus assay is intended as an aid in the diagnosis of influenza from Nasopharyngeal swab specimens and should not be used as a sole basis for treatment. Nasal washings and aspirates are unacceptable for Xpert Xpress SARS-CoV-2/FLU/RSV testing.  Fact Sheet for Patients: EntrepreneurPulse.com.au  Fact Sheet for Healthcare Providers: IncredibleEmployment.be  This test is not yet approved or cleared by the [place] FDA and has been authorized for detection and/or diagnosis of SARS-CoV-2 by FDA under an Emergency Use Authorization (EUA). This EUA will remain in effect (meaning this test can be used) for the duration of the COVID-19 declaration under Section 564(b)(1) of the Act, 21 U.S.C. section 360bbb-3(b)(1), unless the authorization is terminated  or revoked.     Resp Syncytial Virus by PCR NEGATIVE NEGATIVE    Comment: (NOTE) Fact Sheet for Patients: EntrepreneurPulse.com.au  Fact Sheet for Healthcare Providers: IncredibleEmployment.be  This test is not yet approved or cleared by the [place] FDA and has been authorized for detection and/or diagnosis of SARS-CoV-2 by FDA under an Emergency Use Authorization (EUA). This EUA will remain in effect (meaning this test can be used) for the duration of the COVID-19 declaration under Section 564(b)(1) of the Act, 21 U.S.C. section 360bbb-3(b)(1), unless the authorization is terminated or revoked.  Performed at [facility], [place] [address] [place], [place] [address]   POC CBG, ED     Status: Abnormal   Collection Time: 06/25/22  3:09 PM  Result Value Ref Range   Glucose-Capillary 126 (H) 70 - 99 mg/dL    Comment: Glucose reference range applies only to samples taken after fasting for at least 8 hours.  Urinalysis, Routine w reflex microscopic -Urine, Clean Catch     Status: Abnormal   Collection Time: 06/25/22  3:30 PM  Result Value Ref Range   Color, Urine STRAW (A) YELLOW   APPearance CLEAR CLEAR   Specific Gravity, Urine 1.014 1.005 - 1.030   pH 5.0 5.0 - 8.0   Glucose, UA >=500 (A) NEGATIVE mg/dL   Hgb urine dipstick NEGATIVE NEGATIVE   Bilirubin Urine NEGATIVE NEGATIVE   Ketones, ur 5 (A) NEGATIVE mg/dL   Protein, ur NEGATIVE NEGATIVE mg/dL   Nitrite NEGATIVE NEGATIVE   Leukocytes,Ua TRACE (A) NEGATIVE   RBC / HPF 0-5 0 - 5 RBC/hpf   WBC, UA 11-20  0 - 5 WBC/hpf   Bacteria, UA RARE (A) NONE SEEN   Squamous Epithelial / HPF 0-5 0 - 5 /HPF    Comment: Performed at [facility], [place] [address], [place], [place] [address]  CBC with Differential     Status: Abnormal   Collection Time: 06/25/22  3:56 PM  Result Value Ref Range   WBC 6.5 4.0 - 10.5 K/uL   RBC 5.57 (H) 3.87 - 5.11 MIL/uL    Hemoglobin 16.9 (H) 12.0 - 15.0 g/dL   HCT 50.4 (H) 36.0 - 46.0 %   MCV 90.5 80.0 - 100.0 fL   MCH 30.3 26.0 - 34.0 pg   MCHC 33.5 30.0 - 36.0 g/dL   RDW 13.5 11.5 - 15.5 %   Platelets 267 150 - 400 K/uL   nRBC 0.0 0.0 - 0.2 %   Neutrophils Relative % 49 %   Neutro Abs 3.2 1.7 - 7.7 K/uL   Lymphocytes Relative 41 %   Lymphs Abs 2.6 0.7 - 4.0 K/uL   Monocytes Relative 7 %   Monocytes Absolute 0.5 0.1 - 1.0 K/uL   Eosinophils Relative 2 %   Eosinophils Absolute 0.1 0.0 - 0.5 K/uL   Basophils Relative 1 %   Basophils Absolute 0.0 0.0 - 0.1 K/uL   Immature Granulocytes 0 %   Abs Immature Granulocytes 0.02 0.00 - 0.07 K/uL    Comment: Performed at [facility], [place] [address] [place], [place] [address]  Comprehensive metabolic panel     Status: Abnormal   Collection Time: 06/25/22  3:56 PM  Result Value Ref Range   Sodium 139 135 - 145 mmol/L   Potassium 4.1 3.5 - 5.1 mmol/L   Chloride 102 98 - 111 mmol/L   CO2 26 22 - 32 mmol/L   Glucose, Bld 114 (H) 70 - 99 mg/dL    Comment: Glucose reference range applies only to samples taken after fasting for at least 8 hours.   BUN 15 8 - 23 mg/dL   Creatinine, Ser 0.92 0.44 - 1.00 mg/dL   Calcium 9.7 8.9 - 10.3 mg/dL   Total Protein 8.6 (H) 6.5 - 8.1 g/dL   Albumin 4.5 3.5 - 5.0 g/dL   AST 37 15 - 41 U/L   ALT 42 0 - 44 U/L   Alkaline Phosphatase 62 38 - 126 U/L   Total Bilirubin 0.8 0.3 - 1.2 mg/dL   GFR, Estimated >60 >60 mL/min    Comment: (NOTE) Calculated using the CKD-EPI Creatinine Equation (2021)    Anion gap 11 5 - 15    Comment: Performed at [facility], [place] [address], [place], [place] [address]  TSH     Status: None   Collection Time: 06/25/22  3:58 PM  Result Value Ref Range   TSH 1.847 0.350 - 4.500 uIU/mL    Comment: Performed by a 3rd Generation assay with a functional sensitivity of <=0.01 uIU/mL. Performed at [facility], [place] [address], [place],  [place] [address]   POCT glycosylated hemoglobin (Hb A1C)     Status: Abnormal   Collection Time: 07/19/22 10:25 AM  Result Value Ref Range   Hemoglobin A1C 6.7 (A) 4.0 - 5.6 %   HbA1c POC (<> result, manual entry)     HbA1c, POC (prediabetic range)     HbA1c, POC (controlled diabetic range)           Alesia Banda, MD, MS

## 2022-07-19 NOTE — Patient Instructions (Signed)
For vertigo, take meclizine as needed.  Try vestibular exercises.  Please follow-up with no improvement. Diabetes is well-controlled.  Please continue current medications.

## 2022-07-19 NOTE — Assessment & Plan Note (Signed)
Patient reports reduced, but persistent swelling in the hands.  Plan: Continue current anti-rheumatic medications. Consider follow-up with rheumatology to reassess the disease activity and adjust treatment if necessary.

## 2022-07-19 NOTE — Assessment & Plan Note (Signed)
Patient experienced a resolved episode of vertigo with no evidence of neurological deficit, stroke, or infection.  Differential diagnosis: Benign paroxysmal positional vertigo (BPPV), vestibular neuritis, Meniere's disease.   Plan: Continue home vestibular exercises as prescribed. Use meclizine as needed for symptomatic relief during vertigo attacks. Monitor and follow-up for recurrence of symptoms.

## 2022-07-19 NOTE — Assessment & Plan Note (Signed)
Diabetes is well controlled on current medication regimen.  Plan: Continue current antidiabetic medications. Reinforce patient education on diet and exercise. Monitor HbA1c periodically as part of diabetes management.

## 2022-07-19 NOTE — Assessment & Plan Note (Signed)
Patient is managing significant caregiver stress due to a family member's mental health issues.   Plan:  Offer support and counseling resources for caregivers. Encourage self-care strategies, including taking breaks and seeking social support.

## 2022-07-19 NOTE — Assessment & Plan Note (Signed)
Patient report daily eye spasms without visual impairment.  Differential diagnosis: Ocular myokymia.  Plan: Await assessment by the ophthalmologist for targeted evaluation. Monitor for any change in vision or frequency of spasms.

## 2022-07-26 ENCOUNTER — Telehealth: Payer: Self-pay

## 2022-07-26 NOTE — Progress Notes (Signed)
  Chronic Care Management   Note  07/26/2022 Name: SIMRAN BOMKAMP MRN: [id number] DOB: 1956-01-15  Juleen Starr Rickles is a 67 y.o. year old female who is a primary care patient of Bonnita Hollow, MD. I reached out to [employer] by phone today in response to a referral sent by Ms. Yuka A Brendlinger's PCP.  Ms. Kakos was given information about Chronic Care Management services today including:  CCM service includes personalized support from designated clinical staff supervised by the physician, including individualized plan of care and coordination with other care providers 24/7 contact phone numbers for assistance for urgent and routine care needs. Service will only be billed when office clinical staff spend 20 minutes or more in a month to coordinate care. Only one practitioner may furnish and bill the service in a calendar month. The patient may stop CCM services at amy time (effective at the end of the month) by phone call to the office staff. The patient will be responsible for cost sharing (co-pay) or up to 20% of the service fee (after annual deductible is met)  Ms. Kyllie A Corter  agreedto scheduling an appointment with the CCM RN Case Manager   Follow up plan: Patient agreed to scheduled appointment with RN Case Manager on 07/30/2022(date/time).   Noreene Larsson, [place], [place] [address] Direct Dial: [phone] [email or web address]

## 2022-07-29 ENCOUNTER — Telehealth: Payer: Self-pay

## 2022-07-29 NOTE — Progress Notes (Signed)
Care Management & Coordination Services Pharmacy Team  Reason for Encounter: Medication coordination and delivery  Contacted patient to discuss medications and coordinate delivery from [name].  Spoke with patient on 07/29/2022   Cycle dispensing form sent to Daron Offer, [facility] for review.   Last adherence delivery date:07/09/2022      Patient is due for next adherence delivery on: 08/10/2022  This delivery to include: Adherence Packaging  30 Days  Gabapentin 600 mg 1 tablet two time daily - Breakfast, Bedtime Amlodipine 5 mg 1 tablet daily - Breakfast Citalopram 40 mg 1 tablet daily - Breakfast Rosuvastatin 10 mg 1 tablet daily - Bedtime   Patient declined the following medications this month: Farxiga 10 mg - receives patient assistance from [name]  Meclizine 12.5 mg 1 tablet daily PRN Accu-Check Lancets Misc - adequate supply Accu-Check Glucose test strips - adequate supply  No refill request needed.  Confirmed delivery date of 08/10/2022 (First route), advised patient that pharmacy will contact them the morning of delivery.   Any concerns about your medications? No  How often do you forget or accidentally miss a dose? Never  Do you use a pillbox? No  Is patient in packaging Yes  Recent blood pressure readings are as follows: On 07/19/2022 it was 122/84.  Recent blood glucose readings are as follows:None ID   Chart review: Recent office visits:  07/19/2022 Dr. Grandville Silos MD (PCP) star Meclizine 12.5 mg prn, lab work completed A1C- 6.7,AMB Referral to chronic care management services  Recent consult visits:  None ID  Hospital visits:  None in previous 6 months  Medications: Outpatient Encounter Medications as of 07/29/2022  Medication Sig   amLODipine (NORVASC) 5 MG tablet Take 1 tablet (5 mg total) by mouth daily.   blood glucose meter kit and supplies Dispense based on patient and insurance preference. Use up to four times daily as directed. (FOR  ICD-10 E10.9, E11.9).   citalopram (CELEXA) 40 MG tablet Take 1 tablet (40 mg total) by mouth daily.   dapagliflozin propanediol (FARXIGA) 10 MG TABS tablet Take 1 tablet (10 mg total) by mouth daily before breakfast. Patient receives via [name] Patient Assistance   fluticasone (FLONASE) 50 MCG/ACT nasal spray Place 2 sprays into both nostrils daily as needed for allergies or rhinitis.   gabapentin (NEURONTIN) 600 MG tablet Take 1 tablet (600 mg total) by mouth 2 (two) times daily.   glucose blood test strip Use as instructed   hydroxychloroquine (PLAQUENIL) 200 MG tablet Take '200mg'$  by mouth twice daily, Monday through Friday only. None on Saturday or Sunday. (Patient not taking: Reported on 07/19/2022)   ibuprofen (ADVIL) 200 MG tablet Take 200 mg by mouth every 6 (six) hours as needed for mild pain or headache.   Insulin Pen Needle 32G X 4 MM MISC Use with insulin once daily  DX R73.03 and Z79.4   Lancets MISC 1 each by Does not apply route in the morning, at noon, in the evening, and at bedtime.   meclizine (ANTIVERT) 12.5 MG tablet Take 1 tablet (12.5 mg total) by mouth 3 (three) times daily as needed for dizziness.   rosuvastatin (CRESTOR) 10 MG tablet Take 1 tablet (10 mg total) by mouth at bedtime.   Turmeric 500 MG CAPS Take 500 mg by mouth daily. (Patient not taking: Reported on 07/19/2022)   No facility-administered encounter medications on file as of 07/29/2022.   BP Readings from Last 3 Encounters:  07/19/22 122/84  06/25/22 (!) 145/90  06/22/22 113/71  Pulse Readings from Last 3 Encounters:  07/19/22 77  06/25/22 85  06/22/22 75    Lab Results  Component Value Date/Time   HGBA1C 6.7 (A) 07/19/2022 10:25 AM   HGBA1C 7.3 (A) 04/13/2022 10:15 AM   HGBA1C 7.9 (H) 11/30/2021 11:30 AM   HGBA1C 6.7 (H) 11/29/2016 10:57 AM   Lab Results  Component Value Date   CREATININE 0.92 06/25/2022   BUN 15 06/25/2022   GFRNONAA >60 06/25/2022   GFRAA >60 01/19/2017   NA 139 06/25/2022    K 4.1 06/25/2022   CALCIUM 9.7 06/25/2022   CO2 26 06/25/2022     Bessie Munster Clinical Pharmacist Assistant [phone]

## 2022-07-30 ENCOUNTER — Ambulatory Visit (INDEPENDENT_AMBULATORY_CARE_PROVIDER_SITE_OTHER): Payer: Medicare Other | Admitting: *Deleted

## 2022-07-30 DIAGNOSIS — E1165 Type 2 diabetes mellitus with hyperglycemia: Secondary | ICD-10-CM

## 2022-07-30 DIAGNOSIS — E1159 Type 2 diabetes mellitus with other circulatory complications: Secondary | ICD-10-CM

## 2022-07-30 NOTE — Plan of Care (Signed)
Chronic Care Management Provider Comprehensive Care Plan    07/30/2022 Name: Kelli Ruiz MRN: [id number] DOB: [birth year]  Referral to Chronic Care Management (CCM) services was placed by Provider:  Josephine Igo MD on Date: 07/19/22.  Chronic Condition 1: HYPERTENSION Provider Assessment and Plan  Hypertension associated with diabetes ([place])   Expected Outcome/Goals Addressed This Visit (Provider CCM goals/Provider Assessment and plan  CCM (HYPERTENSION) EXPECTED OUTCOME: MONITOR, SELF-MANAGE AND REDUCE SYMPTOMS OF HYPERTENSION  Symptom Management Condition 1: Take medications as prescribed   Attend all scheduled provider appointments Call pharmacy for medication refills 3-7 days in advance of running out of medications Attend church or other social activities Perform all self care activities independently  Perform IADL's (shopping, preparing meals, housekeeping, managing finances) independently Call provider office for new concerns or questions  check blood pressure weekly choose a place to take my blood pressure (home, clinic or office, retail store) write blood pressure results in a log or diary learn about high blood pressure keep a blood pressure log call doctor for signs and symptoms of high blood pressure keep all doctor appointments take medications for blood pressure exactly as prescribed report new symptoms to your doctor eat more whole grains, fruits and vegetables, lean meats and healthy fats Look over education sent via my chart- low sodium diet  Chronic Condition 2: DIABETES Provider Assessment and Plan   Diabetes mellitus ([place]) - Primary       Diabetes is well controlled on current medication regimen.   Plan: Continue current antidiabetic medications. Reinforce patient education on diet and exercise. Monitor HbA1c periodically as part of diabetes management.       Expected Outcome/Goals Addressed This Visit (Provider CCM goals/Provider Assessment and  plan  CCM (DIABETES) EXPECTED OUTCOME: MONITOR, SELF-MANAGE AND REDUCE SYMPTOMS OF DIABETES  Symptom Management Condition 2: Take medications as prescribed   Attend all scheduled provider appointments Call pharmacy for medication refills 3-7 days in advance of running out of medications Attend church or other social activities Perform all self care activities independently  Perform IADL's (shopping, preparing meals, housekeeping, managing finances) independently Call provider office for new concerns or questions  check blood sugar at prescribed times: per doctor's order  check feet daily for cuts, sores or redness enter blood sugar readings and medication or insulin into daily log take the blood sugar log to all doctor visits take the blood sugar meter to all doctor visits trim toenails straight across fill half of plate with vegetables limit fast food meals to no more than 1 per week manage portion size prepare main meal at home 3 to 5 days each week read food labels for fat, fiber, carbohydrates and portion size keep feet up while sitting Look over education sent via my chart- hypoglycemia Please check blood sugar consistently Social worker will contact you for resources related to caregiver stress  Problem List Patient Active Problem List   Diagnosis Date Noted   Vertigo 07/19/2022   Caregiver stress 07/19/2022   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   Rheumatoid arthritis involving multiple sites ([place]) 07/19/2022   Bilateral lower extremity edema 04/18/2022   Excessive daytime sleepiness 01/25/2022   OSA on CPAP 01/25/2022   Non-restorative sleep 01/25/2022   RLS (restless legs syndrome) 01/25/2022   Diabetes mellitus ([place]) 11/23/2021   Hypertension associated with diabetes ([place]) 11/23/2021   Hyperlipidemia associated with type 2 diabetes mellitus ([place]) 11/23/2021   Tremor 11/23/2021   OSA (obstructive sleep apnea) 11/23/2021   Swelling of both hands 11/23/2021  S/P conversion  right knee 01/18/2017    Medication Management  Current Outpatient Medications:    amLODipine (NORVASC) 5 MG tablet, Take 1 tablet (5 mg total) by mouth daily., Disp: 90 tablet, Rfl: 1   blood glucose meter kit and supplies, Dispense based on patient and insurance preference. Use up to four times daily as directed. (FOR ICD-10 E10.9, E11.9)., Disp: 1 each, Rfl: 0   citalopram (CELEXA) 40 MG tablet, Take 1 tablet (40 mg total) by mouth daily., Disp: 90 tablet, Rfl: 1   dapagliflozin propanediol (FARXIGA) 10 MG TABS tablet, Take 1 tablet (10 mg total) by mouth daily before breakfast. Patient receives via [name] Patient Assistance, Disp: 90 tablet, Rfl: 3   fluticasone (FLONASE) 50 MCG/ACT nasal spray, Place 2 sprays into both nostrils daily as needed for allergies or rhinitis., Disp: , Rfl:    gabapentin (NEURONTIN) 600 MG tablet, Take 1 tablet (600 mg total) by mouth 2 (two) times daily., Disp: 180 tablet, Rfl: 1   glucose blood test strip, Use as instructed, Disp: 100 each, Rfl: 12   ibuprofen (ADVIL) 200 MG tablet, Take 200 mg by mouth every 6 (six) hours as needed for mild pain or headache., Disp: , Rfl:    Insulin Pen Needle 32G X 4 MM MISC, Use with insulin once daily  DX R73.03 and Z79.4, Disp: , Rfl:    Lancets MISC, 1 each by Does not apply route in the morning, at noon, in the evening, and at bedtime., Disp: 100 each, Rfl: 12   rosuvastatin (CRESTOR) 10 MG tablet, Take 1 tablet (10 mg total) by mouth at bedtime., Disp: 90 tablet, Rfl: 3   hydroxychloroquine (PLAQUENIL) 200 MG tablet, Take 200mg  by mouth twice daily, Monday through Friday only. None on Saturday or Sunday. (Patient not taking: Reported on 07/19/2022), Disp: 40 tablet, Rfl: 2   meclizine (ANTIVERT) 12.5 MG tablet, Take 1 tablet (12.5 mg total) by mouth 3 (three) times daily as needed for dizziness. (Patient not taking: Reported on 07/30/2022), Disp: 30 tablet, Rfl: 0   Turmeric 500 MG CAPS, Take 500 mg by mouth daily. (Patient  not taking: Reported on 07/30/2022), Disp: , Rfl:   Cognitive Assessment Identity Confirmed: : Name; DOB Cognitive Status: Normal   Functional Assessment Hearing Difficulty or Deaf: no Wear Glasses or Blind: yes Vision Management: reading glasses Concentrating, Remembering or Making Decisions Difficulty (CP): no Difficulty Communicating: no Difficulty Eating/Swallowing: no Walking or Climbing Stairs Difficulty: no Dressing/Bathing Difficulty: no Doing Errands Independently Difficulty (such as shopping) (CP): no   Caregiver Assessment  Primary Source of Support/Comfort: extended family Name of Support/Comfort Primary Source: extended family People in Home: sibling(s) Primary Roles/Responsibilities: caregiver for other(s) (pt is caregiver for her sister (lives w/ pt)   sister has dementia, psychosis, in process of transitioning to different level of care)   Planned Interventions  Provided education to patient about basic DM disease process; Reviewed medications with patient and discussed importance of medication adherence;        Reviewed prescribed diet with patient carbohydrate modified; Counseled on importance of regular laboratory monitoring as prescribed;        Discussed plans with patient for ongoing care management follow up and provided patient with direct contact information for care management team;      Provided patient with written educational materials related to hypo and hyperglycemia and importance of correct treatment;       Advised patient, providing education and rationale, to check cbg per doctor's order  and record        call provider for findings outside established parameters;       Referral made to social work team for assistance with caregiver stress/ resources;      Review of patient status, including review of consultants reports, relevant laboratory and other test results, and medications completed;       Screening for signs and symptoms of depression  related to chronic disease state;        Assessed social determinant of health barriers;        Evaluation of current treatment plan related to hypertension self management and patient's adherence to plan as established by provider;   Reviewed prescribed diet low sodium Reviewed medications with patient and discussed importance of compliance;  Counseled on the importance of exercise goals with target of 150 minutes per week Discussed plans with patient for ongoing care management follow up and provided patient with direct contact information for care management team; Advised patient, providing education and rationale, to monitor blood pressure daily and record, calling PCP for findings outside established parameters;  Advised patient to discuss any issues with blood sugar, medications with provider; Provided education on prescribed diet low sodium;  Discussed complications of poorly controlled blood pressure such as heart disease, stroke, circulatory complications, vision complications, kidney impairment, sexual dysfunction;  Screening for signs and symptoms of depression related to chronic disease state;  Assessed social determinant of health barriers;  Referral for social worker completed for caregiver stress/ resources  Interaction and coordination with outside resources, practitioners, and providers See CCM Referral  Care Plan: Available in MyChart

## 2022-07-30 NOTE — Chronic Care Management (AMB) (Signed)
Chronic Care Management   CCM RN Visit Note  07/30/2022 Name: Kelli Ruiz MRN: [id number] DOB: November 20, 1955  Subjective: Kelli Ruiz is a 67 y.o. year old female who is a primary care patient of Bonnita Hollow, MD. The patient was referred to the Chronic Care Management team for assistance with care management needs subsequent to provider initiation of CCM services and plan of care.    Today's Visit:  Engaged with patient by telephone for initial visit.     SDOH Interventions Today    Flowsheet Row Most Recent Value  SDOH Interventions   Food Insecurity Interventions Intervention Not Indicated  Housing Interventions Intervention Not Indicated  Transportation Interventions Intervention Not Indicated  Utilities Interventions Intervention Not Indicated  Financial Strain Interventions Intervention Not Indicated  Physical Activity Interventions Intervention Not Indicated  Stress Interventions Other (Comment)  [Social work referral for caregiver stress]  Social Connections Interventions Patient Refused         Goals Addressed             This Visit's Progress    CCM (DIABETES) EXPECTED OUTCOME: MONITOR, SELF- MANAGE AND REDUCE SYMPTOMS OF DIABETES       Current Barriers:  Knowledge Deficits related to Diabetes management Chronic Disease Management support and education needs related to Diabetes, diet Patient reports she does not consistently check CBG, states she only checks if symptomatic for hypo or hyperglycemia Patient reports she follows Mediterranean diet, pt walks her dog daily for exercise  Planned Interventions: Provided education to patient about basic DM disease process; Reviewed medications with patient and discussed importance of medication adherence;        Reviewed prescribed diet with patient carbohydrate modified; Counseled on importance of regular laboratory monitoring as prescribed;        Discussed plans with patient for ongoing care management  follow up and provided patient with direct contact information for care management team;      Provided patient with written educational materials related to hypo and hyperglycemia and importance of correct treatment;       Advised patient, providing education and rationale, to check cbg per doctor's order  and record        call provider for findings outside established parameters;       Referral made to social work team for assistance with caregiver stress/ resources;      Review of patient status, including review of consultants reports, relevant laboratory and other test results, and medications completed;       Screening for signs and symptoms of depression related to chronic disease state;        Assessed social determinant of health barriers;         Symptom Management: Take medications as prescribed   Attend all scheduled provider appointments Call pharmacy for medication refills 3-7 days in advance of running out of medications Attend church or other social activities Perform all self care activities independently  Perform IADL's (shopping, preparing meals, housekeeping, managing finances) independently Call provider office for new concerns or questions  check blood sugar at prescribed times: per doctor's order  check feet daily for cuts, sores or redness enter blood sugar readings and medication or insulin into daily log take the blood sugar log to all doctor visits take the blood sugar meter to all doctor visits trim toenails straight across fill half of plate with vegetables limit fast food meals to no more than 1 per week manage portion size prepare main meal at home  3 to 5 days each week read food labels for fat, fiber, carbohydrates and portion size keep feet up while sitting Look over education sent via my chart- hypoglycemia Please check blood sugar consistently Social worker will contact you for resources related to caregiver stress  Follow Up Plan: Telephone follow  up appointment with care management team member scheduled for:  10/12/22 at 130 pm       CCM (HYPERTENSION) EXPECTED OUTCOME: MONITOR, SELF-MANAGE AND REDUCE SYMPTOMS OF HYPERTENSION       Current Barriers:  Knowledge Deficits related to Hypertension management Care Coordination needs related to caregiver stress/ social work referral completed in a patient with Hypertension Chronic Disease Management support and education needs related to Hypertension, diet No Advanced Directives in place- patient reports she has documents and plans to complete Patient reports she lives with 2 sisters and 1 brother in [place] and 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  Patient reports she would like Education officer, museum to contact her for caregiver stress/ resources. Patient reports she is independent with all aspects of her care, pt has blood pressure cuff but does not monitor blood pressure  Planned Interventions: Evaluation of current treatment plan related to hypertension self management and patient's adherence to plan as established by provider;   Reviewed prescribed diet low sodium Reviewed medications with patient and discussed importance of compliance;  Counseled on the importance of exercise goals with target of 150 minutes per week Discussed plans with patient for ongoing care management follow up and provided patient with direct contact information for care management team; Advised patient, providing education and rationale, to monitor blood pressure daily and record, calling PCP for findings outside established parameters;  Advised patient to discuss any issues with blood sugar, medications with provider; Provided education on prescribed diet low sodium;  Discussed complications of poorly controlled blood pressure such as heart disease, stroke, circulatory complications, vision complications, kidney impairment, sexual dysfunction;  Screening for signs and symptoms of depression  related to chronic disease state;  Assessed social determinant of health barriers;  Referral for social worker completed for caregiver stress/ resources  Symptom Management: Take medications as prescribed   Attend all scheduled provider appointments Call pharmacy for medication refills 3-7 days in advance of running out of medications Attend church or other social activities Perform all self care activities independently  Perform IADL's (shopping, preparing meals, housekeeping, managing finances) independently Call provider office for new concerns or questions  check blood pressure weekly choose a place to take my blood pressure (home, clinic or office, retail store) write blood pressure results in a log or diary learn about high blood pressure keep a blood pressure log call doctor for signs and symptoms of high blood pressure keep all doctor appointments take medications for blood pressure exactly as prescribed report new symptoms to your doctor eat more whole grains, fruits and vegetables, lean meats and healthy fats Look over education sent via my chart- low sodium diet  Follow Up Plan: Telephone follow up appointment with care management team member scheduled for:  10/12/22 at 130 pm          Plan:Telephone follow up appointment with care management team member scheduled for:  10/12/22 at 130 pm  Jacqlyn Larsen [facility], BSN RN Case Manager [employer] [phone]

## 2022-07-30 NOTE — Patient Instructions (Signed)
Please call the care guide team at [phone] if you need to cancel or reschedule your appointment.   If you are experiencing a Mental Health or [place] or need someone to talk to, please call the Suicide and Crisis Lifeline: 988 call the [place] National Suicide Prevention Lifeline: 410-369-9747 or TTY: 337 662 8141 TTY 5412077078) to talk to a trained counselor call 1-800-273-TALK (toll free, 24 hour hotline) go to [facility] [address] [place] [phone]) call 911   Following is a copy of the CCM Program Consent:  CCM service includes personalized support from designated clinical staff supervised by the physician, including individualized plan of care and coordination with other care providers 24/7 contact phone numbers for assistance for urgent and routine care needs. Service will only be billed when office clinical staff spend 20 minutes or more in a month to coordinate care. Only one practitioner may furnish and bill the service in a calendar month. The patient may stop CCM services at amy time (effective at the end of the month) by phone call to the office staff. The patient will be responsible for cost sharing (co-pay) or up to 20% of the service fee (after annual deductible is met)  Following is a copy of your full provider care plan:   Goals Addressed             This Visit's Progress    CCM (DIABETES) EXPECTED OUTCOME: MONITOR, SELF- MANAGE AND REDUCE SYMPTOMS OF DIABETES       Current Barriers:  Knowledge Deficits related to Diabetes management Chronic Disease Management support and education needs related to Diabetes, diet Patient reports she does not consistently check CBG, states she only checks if symptomatic for hypo or hyperglycemia Patient reports she follows Mediterranean diet, pt walks her dog daily for exercise  Planned Interventions: Provided education to patient about basic DM disease  process; Reviewed medications with patient and discussed importance of medication adherence;        Reviewed prescribed diet with patient carbohydrate modified; Counseled on importance of regular laboratory monitoring as prescribed;        Discussed plans with patient for ongoing care management follow up and provided patient with direct contact information for care management team;      Provided patient with written educational materials related to hypo and hyperglycemia and importance of correct treatment;       Advised patient, providing education and rationale, to check cbg per doctor's order  and record        call provider for findings outside established parameters;       Referral made to social work team for assistance with caregiver stress/ resources;      Review of patient status, including review of consultants reports, relevant laboratory and other test results, and medications completed;       Screening for signs and symptoms of depression related to chronic disease state;        Assessed social determinant of health barriers;         Symptom Management: Take medications as prescribed   Attend all scheduled provider appointments Call pharmacy for medication refills 3-7 days in advance of running out of medications Attend church or other social activities Perform all self care activities independently  Perform IADL's (shopping, preparing meals, housekeeping, managing finances) independently Call provider office for new concerns or questions  check blood sugar at prescribed times: per doctor's order  check feet daily for cuts, sores or redness enter  blood sugar readings and medication or insulin into daily log take the blood sugar log to all doctor visits take the blood sugar meter to all doctor visits trim toenails straight across fill half of plate with vegetables limit fast food meals to no more than 1 per week manage portion size prepare main meal at home 3 to 5 days each  week read food labels for fat, fiber, carbohydrates and portion size keep feet up while sitting Look over education sent via my chart- hypoglycemia Please check blood sugar consistently Social worker will contact you for resources related to caregiver stress  Follow Up Plan: Telephone follow up appointment with care management team member scheduled for:  10/12/22 at 130 pm       CCM (HYPERTENSION) EXPECTED OUTCOME: MONITOR, SELF-MANAGE AND REDUCE SYMPTOMS OF HYPERTENSION       Current Barriers:  Knowledge Deficits related to Hypertension management Care Coordination needs related to caregiver stress/ social work referral completed in a patient with Hypertension Chronic Disease Management support and education needs related to Hypertension, diet No Advanced Directives in place- patient reports she has documents and plans to complete Patient reports she lives with 2 sisters and 1 brother in [place] and 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  Patient reports she would like Education officer, museum to contact her for caregiver stress/ resources. Patient reports she is independent with all aspects of her care, pt has blood pressure cuff but does not monitor blood pressure  Planned Interventions: Evaluation of current treatment plan related to hypertension self management and patient's adherence to plan as established by provider;   Reviewed prescribed diet low sodium Reviewed medications with patient and discussed importance of compliance;  Counseled on the importance of exercise goals with target of 150 minutes per week Discussed plans with patient for ongoing care management follow up and provided patient with direct contact information for care management team; Advised patient, providing education and rationale, to monitor blood pressure daily and record, calling PCP for findings outside established parameters;  Advised patient to discuss any issues with blood sugar, medications  with provider; Provided education on prescribed diet low sodium;  Discussed complications of poorly controlled blood pressure such as heart disease, stroke, circulatory complications, vision complications, kidney impairment, sexual dysfunction;  Screening for signs and symptoms of depression related to chronic disease state;  Assessed social determinant of health barriers;  Referral for social worker completed for caregiver stress/ resources  Symptom Management: Take medications as prescribed   Attend all scheduled provider appointments Call pharmacy for medication refills 3-7 days in advance of running out of medications Attend church or other social activities Perform all self care activities independently  Perform IADL's (shopping, preparing meals, housekeeping, managing finances) independently Call provider office for new concerns or questions  check blood pressure weekly choose a place to take my blood pressure (home, clinic or office, retail store) write blood pressure results in a log or diary learn about high blood pressure keep a blood pressure log call doctor for signs and symptoms of high blood pressure keep all doctor appointments take medications for blood pressure exactly as prescribed report new symptoms to your doctor eat more whole grains, fruits and vegetables, lean meats and healthy fats Look over education sent via my chart- low sodium diet  Follow Up Plan: Telephone follow up appointment with care management team member scheduled for:  10/12/22 at 130 pm          Patient verbalizes  understanding of instructions and care plan provided today and agrees to view in [place]. Active MyChart status and patient understanding of how to access instructions and care plan via MyChart confirmed with patient.     Telephone follow up appointment with care management team member scheduled for:  10/12/22 at 130 pm  Low-Sodium Eating Plan Sodium, which is an element that makes  up salt, helps you maintain a healthy balance of fluids in your body. Too much sodium can increase your blood pressure and cause fluid and waste to be held in your body. Your health care provider or dietitian may recommend following this plan if you have high blood pressure (hypertension), kidney disease, liver disease, or heart failure. Eating less sodium can help lower your blood pressure, reduce swelling, and protect your heart, liver, and kidneys. What are tips for following this plan? Reading food labels The Nutrition Facts label lists the amount of sodium in one serving of the food. If you eat more than one serving, you must multiply the listed amount of sodium by the number of servings. Choose foods with less than 140 mg of sodium per serving. Avoid foods with 300 mg of sodium or more per serving. Shopping  Look for lower-sodium products, often labeled as "low-sodium" or "no salt added." Always check the sodium content, even if foods are labeled as "unsalted" or "no salt added." Buy fresh foods. Avoid canned foods and pre-made or frozen meals. Avoid canned, cured, or processed meats. Buy breads that have less than 80 mg of sodium per slice. Cooking  Eat more home-cooked food and less restaurant, buffet, and fast food. Avoid adding salt when cooking. Use salt-free seasonings or herbs instead of table salt or sea salt. Check with your health care provider or pharmacist before using salt substitutes. Cook with plant-based oils, such as canola, sunflower, or olive oil. Meal planning When eating at a restaurant, ask that your food be prepared with less salt or no salt, if possible. Avoid dishes labeled as brined, pickled, cured, smoked, or made with soy sauce, miso, or teriyaki sauce. Avoid foods that contain MSG (monosodium glutamate). MSG is sometimes added to [place] food, bouillon, and some canned foods. Make meals that can be grilled, baked, poached, roasted, or steamed. These are  generally made with less sodium. General information Most people on this plan should limit their sodium intake to 1,500-2,000 mg (milligrams) of sodium each day. What foods should I eat? Fruits Fresh, frozen, or canned fruit. Fruit juice. Vegetables Fresh or frozen vegetables. "No salt added" canned vegetables. "No salt added" tomato sauce and paste. Low-sodium or reduced-sodium tomato and vegetable juice. Grains Low-sodium cereals, including oats, puffed wheat and rice, and shredded wheat. Low-sodium crackers. Unsalted rice. Unsalted pasta. Low-sodium bread. Whole-grain breads and whole-grain pasta. Meats and other proteins Fresh or frozen (no salt added) meat, poultry, seafood, and fish. Low-sodium canned tuna and salmon. Unsalted nuts. Dried peas, beans, and lentils without added salt. Unsalted canned beans. Eggs. Unsalted nut butters. Dairy Milk. Soy milk. Cheese that is naturally low in sodium, such as ricotta cheese, fresh mozzarella, or Swiss cheese. Low-sodium or reduced-sodium cheese. Cream cheese. Yogurt. Seasonings and condiments Fresh and dried herbs and spices. Salt-free seasonings. Low-sodium mustard and ketchup. Sodium-free salad dressing. Sodium-free light mayonnaise. Fresh or refrigerated horseradish. Lemon juice. Vinegar. Other foods Homemade, reduced-sodium, or low-sodium soups. Unsalted popcorn and pretzels. Low-salt or salt-free chips. The items listed above may not be a complete list of foods and beverages you can  eat. Contact a dietitian for more information. What foods should I avoid? Vegetables Sauerkraut, pickled vegetables, and relishes. Olives. [place] fries. Onion rings. Regular canned vegetables (not low-sodium or reduced-sodium). Regular canned tomato sauce and paste (not low-sodium or reduced-sodium). Regular tomato and vegetable juice (not low-sodium or reduced-sodium). Frozen vegetables in sauces. Grains Instant hot cereals. Bread stuffing, pancake, and biscuit  mixes. Croutons. Seasoned rice or pasta mixes. Noodle soup cups. Boxed or frozen macaroni and cheese. Regular salted crackers. Self-rising flour. Meats and other proteins Meat or fish that is salted, canned, smoked, spiced, or pickled. Precooked or cured meat, such as sausages or meat loaves. Berniece Salines. Ham. Pepperoni. Hot dogs. Corned beef. Chipped beef. Salt pork. Jerky. Pickled herring. Anchovies and sardines. Regular canned tuna. Salted nuts. Dairy Processed cheese and cheese spreads. Hard cheeses. Cheese curds. Blue cheese. Feta cheese. String cheese. Regular cottage cheese. Buttermilk. Canned milk. Fats and oils Salted butter. Regular margarine. Ghee. Bacon fat. Seasonings and condiments Onion salt, garlic salt, seasoned salt, table salt, and sea salt. Canned and packaged gravies. Worcestershire sauce. Tartar sauce. Barbecue sauce. Teriyaki sauce. Soy sauce, including reduced-sodium. Steak sauce. Fish sauce. Oyster sauce. Cocktail sauce. Horseradish that you find on the shelf. Regular ketchup and mustard. Meat flavorings and tenderizers. Bouillon cubes. Hot sauce. Pre-made or packaged marinades. Pre-made or packaged taco seasonings. Relishes. Regular salad dressings. Salsa. Other foods Salted popcorn and pretzels. Corn chips and puffs. Potato and tortilla chips. Canned or dried soups. Pizza. Frozen entrees and pot pies. The items listed above may not be a complete list of foods and beverages you should avoid. Contact a dietitian for more information. Summary Eating less sodium can help lower your blood pressure, reduce swelling, and protect your heart, liver, and kidneys. Most people on this plan should limit their sodium intake to 1,500-2,000 mg (milligrams) of sodium each day. Canned, boxed, and frozen foods are high in sodium. Restaurant foods, fast foods, and pizza are also very high in sodium. You also get sodium by adding salt to food. Try to cook at home, eat more fresh fruits and  vegetables, and eat less fast food and canned, processed, or prepared foods. This information is not intended to replace advice given to you by your health care provider. Make sure you discuss any questions you have with your health care provider. Document Revised: 04/09/2019 Document Reviewed: 04/04/2019 Elsevier Patient Education  [place]. Hypoglycemia Hypoglycemia is when the sugar (glucose) level in your blood is too low. Low blood sugar can happen to people who have diabetes and people who do not have diabetes. Low blood sugar can happen quickly, and it can be an emergency. What are the causes? This condition happens most often in people who have diabetes. It may be caused by: Diabetes medicine. Not eating enough, or not eating often enough. Doing more physical activity. Drinking alcohol on an empty stomach. If you do not have diabetes, this condition may be caused by: A tumor in the pancreas. Not eating enough, or not eating for long periods at a time (fasting). A very bad infection or illness. Problems after having weight loss (bariatric) surgery. Kidney failure or liver failure. Certain medicines. What increases the risk? This condition is more likely to develop in people who: Have diabetes and take medicines to lower their blood sugar. Abuse alcohol. Have a very bad illness. What are the signs or symptoms? Mild Hunger. Sweating and feeling clammy. Feeling dizzy or light-headed. Being sleepy or having trouble sleeping. Feeling like  you may vomit (nauseous). A fast heartbeat. A headache. Blurry vision. Mood changes, such as: Being grouchy. Feeling worried or nervous (anxious). Tingling or loss of feeling (numbness) around your mouth, lips, or tongue. Moderate Confusion and poor judgment. Behavior changes. Weakness. Uneven heartbeat. Trouble with moving (coordination). Very low Very low blood sugar (severe hypoglycemia) is a medical emergency. It can  cause: Fainting. Seizures. Loss of consciousness (coma). Death. How is this treated? Treating low blood sugar Low blood sugar is often treated by eating or drinking something that has sugar in it right away. The food or drink should contain 15 grams of a fast-acting carb (carbohydrate). Options include: 4 oz (120 mL) of fruit juice. 4 oz (120 mL) of regular soda (not diet soda). A few pieces of hard candy. Check food labels to see how many pieces to eat for 15 grams. 1 Tbsp (15 mL) of sugar or honey. 4 glucose tablets. 1 tube of glucose gel. Treating low blood sugar if you have diabetes If you can think clearly and swallow safely, follow the 15:15 rule: Take 15 grams of a fast-acting carb. Talk with your doctor about how much you should take. Always keep a source of fast-acting carb with you, such as: Glucose tablets (take 4 tablets). A few pieces of hard candy. Check food labels to see how many pieces to eat for 15 grams. 4 oz (120 mL) of fruit juice. 4 oz (120 mL) of regular soda (not diet soda). 1 Tbsp (15 mL) of honey or sugar. 1 tube of glucose gel. Check your blood sugar 15 minutes after you take the carb. If your blood sugar is still at or below 70 mg/dL (3.9 mmol/L), take 15 grams of a carb again. If your blood sugar does not go above 70 mg/dL (3.9 mmol/L) after 3 tries, get help right away. After your blood sugar goes back to normal, eat a meal or a snack within 1 hour.  Treating very low blood sugar If your blood sugar is below 54 mg/dL (3 mmol/L), you have very low blood sugar, or severe hypoglycemia. This is an emergency. Get medical help right away. If you have very low blood sugar and you cannot eat or drink, you will need to be given a hormone called glucagon. A family member or friend should learn how to check your blood sugar and how to give you glucagon. Ask your doctor if you need to have an emergency glucagon kit at home. Very low blood sugar may also need to be  treated in a hospital. Follow these instructions at home: General instructions Take over-the-counter and prescription medicines only as told by your doctor. Stay aware of your blood sugar as told by your doctor. If you drink alcohol: Limit how much you have to: 0-1 drink a day for women who are not pregnant. 0-2 drinks a day for men. Know how much alcohol is in your drink. In the [place] one drink equals one 12 oz bottle of beer (355 mL), one 5 oz glass of wine (148 mL), or one 1 oz glass of hard liquor (44 mL). Be sure to eat food when you drink alcohol. Know that your body absorbs alcohol quickly. This may lead to low blood sugar later. Be sure to keep checking your blood sugar. Keep all follow-up visits. If you have diabetes:  Always have a fast-acting carb (15 grams) with you to treat low blood sugar. Follow your diabetes care plan as told by your doctor. Make sure you:  Know the symptoms of low blood sugar. Check your blood sugar as often as told. Always check it before and after exercise. Always check your blood sugar before you drive. Take your medicines as told. Follow your meal plan. Eat on time. Do not skip meals. Share your diabetes care plan with: Your work or school. People you live with. Carry a card or wear jewelry that says you have diabetes. Where to find more information American Diabetes Association: www.diabetes.org Contact a doctor if: You have trouble keeping your blood sugar in your target range. You have low blood sugar often. Get help right away if: You still have symptoms after you eat or drink something that contains 15 grams of fast-acting carb, and you cannot get your blood sugar above 70 mg/dL by following the 15:15 rule. Your blood sugar is below 54 mg/dL (3 mmol/L). You have a seizure. You faint. These symptoms may be an emergency. Get help right away. Call your local emergency services (911 in the [place] Do not wait to see if the symptoms will go  away. Do not drive yourself to the hospital. Summary Hypoglycemia happens when the level of sugar (glucose) in your blood is too low. Low blood sugar can happen to people who have diabetes and people who do not have diabetes. Low blood sugar can happen quickly, and it can be an emergency. Make sure you know the symptoms of low blood sugar and know how to treat it. Always keep a source of sugar (fast-acting carb) with you to treat low blood sugar. This information is not intended to replace advice given to you by your health care provider. Make sure you discuss any questions you have with your health care provider. Document Revised: 04/03/2020 Document Reviewed: 04/03/2020 Elsevier Patient Education  [place].

## 2022-08-02 ENCOUNTER — Telehealth: Payer: Self-pay

## 2022-08-02 NOTE — Progress Notes (Unsigned)
  Chronic Care Management   Note  08/02/2022 Name: Kelli Ruiz MRN: [id number] DOB: Aug 11, 1955  Kelli Ruiz is a 67 y.o. year old female who is a primary care patient of Bonnita Hollow, MD. I reached out to [employer] by phone today in response to a referral sent by Ms. Mireyah A Jankowiak's PCP.  The first contact attempt was unsuccessful.   Follow up plan: Additional outreach attempts will be made.  Noreene Larsson, [place], [place] [address] Direct Dial: [phone] [email or web address]

## 2022-08-05 DIAGNOSIS — M069 Rheumatoid arthritis, unspecified: Secondary | ICD-10-CM | POA: Diagnosis not present

## 2022-08-05 DIAGNOSIS — E119 Type 2 diabetes mellitus without complications: Secondary | ICD-10-CM | POA: Diagnosis not present

## 2022-08-05 DIAGNOSIS — Z79899 Other long term (current) drug therapy: Secondary | ICD-10-CM | POA: Diagnosis not present

## 2022-08-05 DIAGNOSIS — H2513 Age-related nuclear cataract, bilateral: Secondary | ICD-10-CM | POA: Diagnosis not present

## 2022-08-05 LAB — HM DIABETES EYE EXAM

## 2022-08-05 NOTE — Progress Notes (Signed)
  Care Coordination  Note  08/05/2022 Name: Kelli Ruiz MRN: [id number] DOB: 23-Jan-1956  Kelli Ruiz is a 67 y.o. year old female who is a primary care patient of Bonnita Hollow, MD. I reached out to [employer] by phone today to offer care coordination services.      [name] was given information about Care Coordination services today including:  The Care Coordination services include support from the care team which includes your Nurse Coordinator, Clinical Social Worker, or Pharmacist.  The Care Coordination team is here to help remove barriers to the health concerns and goals most important to you. Care Coordination services are voluntary and the patient may decline or stop services at any time by request to their care team member.   Patient agreed to services and verbal consent obtained.   Follow up plan: Telephone appointment with care coordination team member scheduled for:08/06/2022  Noreene Larsson, [place], [place] [address] Direct Dial: [phone] [email or web address]

## 2022-08-06 ENCOUNTER — Ambulatory Visit: Payer: Self-pay | Admitting: Licensed Clinical Social Worker

## 2022-08-06 NOTE — Patient Instructions (Signed)
Visit Information  Thank you for taking time to visit with me today. Please don't hesitate to contact me if I can be of assistance to you.   Following are the goals we discussed today:   Goals Addressed             This Visit's Progress    Connect with community support for patient and sister's well being       Activities and task to complete in order to accomplish goals.   Complete Advance Directive packet,  The follow information has been e-mailed to you per your request Support group for caregiver and day program for individuals with dementia  Applying for Medicaid and Food benefits ( ePass link provided) Advance Directive packet  Contact the [facility] about applying for Social Security  Mental health Treatment for individuals with no insurance and day program         Our next appointment is by telephone on 08/12/22   Please call the care guide team at [phone] if you need to cancel or reschedule your appointment.   If you are experiencing a Mental Health or [place] or need someone to talk to, please call the Suicide and Crisis Lifeline: 988 call the [place] National Suicide Prevention Lifeline: 587-548-1419 or TTY: (608)377-4567 TTY 516-618-8927) to talk to a trained counselor call 1-800-273-TALK (toll free, 24 hour hotline) go to [facility] [address] [place] [phone]) call 911    Patient verbalizes understanding of instructions and care plan provided today and agrees to view in [place]. Active MyChart status and patient understanding of how to access instructions and care plan via MyChart confirmed with patient.     Casimer Lanius, [place] [phone]

## 2022-08-06 NOTE — Patient Outreach (Signed)
  Care Coordination  Initial Visit Note   08/06/2022 Name: Kelli Ruiz MRN: [id number] DOB: [birth year]  Kelli Ruiz is a 67 y.o. year old female who sees Kelli Hollow, MD for primary care. I spoke with  Kelli Ruiz by phone today.  What matters to the patients health and wellness today?  Her well being as well as her sister  Patient was accompanied by sister Kelli Ruiz [phone] who provided information during this encounter. They are experience stress related to caring for a 3rd sister. Assessed needs and provided resources for community support. Situation is very complex and will require collaboration and research in order to meet the need .    Goals Addressed             This Visit's Progress    Connect with community support for patient and sister's well being       Activities and task to complete in order to accomplish goals.   Complete Advance Directive packet,  The follow information has been e-mailed to you per your request Support group for caregiver and day program for individuals with dementia  Applying for Medicaid and Food benefits ( ePass link provided) Advance Directive packet  Contact the [place] about applying for Social Security  Mental health Treatment for individuals with no insurance and day program        SDOH assessments and interventions completed:  Yes  SDOH Interventions Today    Flowsheet Row Most Recent Value  SDOH Interventions   Stress Interventions Provide Counseling       Care Coordination Interventions:  Yes, provided  Interventions Today    Flowsheet Row Most Recent Value  Chronic Disease   Chronic disease during today's visit Diabetes, Hypertension (HTN)  General Interventions   General Interventions Discussed/Reviewed General Interventions Discussed, [facility], food stamps, mental health treatment options for sister]  Education Interventions   Education Provided Provided  Education, Provided Web-based Education  Provided Verbal Education On [employer], Personal assistant, Mental Health/Coping with Illness  [place] Discussed, Coping Strategies, Other  [caregiver stress with sister (mental health) solution focus, task centered, emotional support,]       Follow up plan: Follow up call scheduled for 08/12/22    Encounter Outcome:  Pt. Visit Completed   Kelli Ruiz, [place] [phone]

## 2022-08-12 ENCOUNTER — Ambulatory Visit: Payer: Self-pay | Admitting: Licensed Clinical Social Worker

## 2022-08-12 ENCOUNTER — Ambulatory Visit: Payer: Medicare Other

## 2022-08-12 DIAGNOSIS — E1165 Type 2 diabetes mellitus with hyperglycemia: Secondary | ICD-10-CM

## 2022-08-12 DIAGNOSIS — M069 Rheumatoid arthritis, unspecified: Secondary | ICD-10-CM

## 2022-08-12 MED ORDER — DAPAGLIFLOZIN PROPANEDIOL 10 MG PO TABS: 10.0000 mg | ORAL_TABLET | Freq: Every day | ORAL | 3 refills | Status: DC

## 2022-08-12 NOTE — Progress Notes (Signed)
Care Management & Coordination Services Pharmacy Note  08/12/2022 Name:  Kelli Ruiz MRN:  [id number] DOB:  1956/02/26  Summary: Patient presents for follow-up consult.   -Home blood sugars remain well controlled on Farxiga.   -Continues to struggle with swelling and stiffness in hands. She feels her swelling is slightly better recently and varies significantly with the weather. She has chosen not to start her hydroxychloroquine due to concerns over the side effects of the medication and didn't think the benefits outweighed the risk of the medication at this time.    Recommendations/Changes made from today's visit: Continue current medications  -Discussed the progressive nature of rheumatoid arthritis and to inform rheumatology if her inflammation or swelling symptoms worsen.  -Recheck LDL at PCP follow-up.   Follow up plan: CPP follow-up 6 months   Subjective: Kelli Ruiz is an 67 y.o. year old female who is a primary patient of Bonnita Hollow, MD.  The care coordination team was consulted for assistance with disease management and care coordination needs.    Engaged with patient by telephone for follow up visit.  Recent office visits: 07/19/2022: Patient presented to Dr. Grandville Silos for follow-up. A1c 6.7%. Meclizine.   Recent consult visits: 06/22/22: Patient presented to Dr. Estanislado Pandy (Rheumatology). Hydroxychloroquine.   Hospital visits: 06/25/22: Patient presented to ED for tremor.   Objective:  Lab Results  Component Value Date   CREATININE 0.92 06/25/2022   BUN 15 06/25/2022   EGFR 60 06/22/2022   GFRNONAA >60 06/25/2022   GFRAA >60 01/19/2017   NA 139 06/25/2022   K 4.1 06/25/2022   CALCIUM 9.7 06/25/2022   CO2 26 06/25/2022   GLUCOSE 114 (H) 06/25/2022    Lab Results  Component Value Date/Time   HGBA1C 6.7 (A) 07/19/2022 10:25 AM   HGBA1C 7.3 (A) 04/13/2022 10:15 AM   HGBA1C 7.9 (H) 11/30/2021 11:30 AM   HGBA1C 6.7 (H) 11/29/2016 10:57 AM   MICROALBUR  <0.7 04/13/2022 10:53 AM    Last diabetic Eye exam:  Lab Results  Component Value Date/Time   HMDIABEYEEXA No Retinopathy 12/14/2021 10:18 AM    Last diabetic Foot exam: No results found for: "HMDIABFOOTEX"   No results found for: "CHOL", "HDL", "LDLCALC", "LDLDIRECT", "TRIG", "CHOLHDL"     Latest Ref Rng & Units 06/25/2022    3:56 PM 06/22/2022   11:22 AM 11/14/2021   11:49 AM  Hepatic Function  Total Protein 6.5 - 8.1 g/dL 8.6  7.6  8.0   Albumin 3.5 - 5.0 g/dL 4.5   4.2   AST 15 - 41 U/L 37  36  28   ALT 0 - 44 U/L 42  43  25   Alk Phosphatase 38 - 126 U/L 62   60   Total Bilirubin 0.3 - 1.2 mg/dL 0.8  0.4  0.9     Lab Results  Component Value Date/Time   TSH 1.847 06/25/2022 03:58 PM       Latest Ref Rng & Units 06/25/2022    3:56 PM 06/22/2022   11:22 AM 11/14/2021   11:49 AM  CBC  WBC 4.0 - 10.5 K/uL 6.5  6.2  7.6   Hemoglobin 12.0 - 15.0 g/dL 16.9  15.2  14.8   Hematocrit 36.0 - 46.0 % 50.4  44.8  44.0   Platelets 150 - 400 K/uL 267  273  273     No results found for: "VD25OH", "VITAMINB12"  Clinical ASCVD: No  The ASCVD Risk score (Arnett DK, et  al., 2019) failed to calculate for the following reasons:   Cannot find a previous HDL lab   Cannot find a previous total cholesterol lab       07/30/2022   12:56 PM 07/19/2022   11:08 AM 04/13/2022   10:54 AM  Depression screen PHQ 2/9  Decreased Interest 0 0 0  Down, Depressed, Hopeless 0 0 0  PHQ - 2 Score 0 0 0  Altered sleeping  0 0  Tired, decreased energy  0 0  Change in appetite  0 1  Feeling bad or failure about yourself   0 0  Trouble concentrating  0 0  Moving slowly or fidgety/restless  0 0  Suicidal thoughts  0 0  PHQ-9 Score  0 1  Difficult doing work/chores  Not difficult at all Not difficult at all     Social History   Tobacco Use  Smoking Status Former   Years: 20   Types: Cigarettes   Quit date: 01/09/2017   Years since quitting: 5.5  Smokeless Tobacco Never  Tobacco Comments   Pt uses  vapor   BP Readings from Last 3 Encounters:  07/19/22 122/84  06/25/22 (!) 145/90  06/22/22 113/71   Pulse Readings from Last 3 Encounters:  07/19/22 77  06/25/22 85  06/22/22 75   Wt Readings from Last 3 Encounters:  07/19/22 157 lb 12.8 oz (71.6 kg)  06/22/22 158 lb 9.6 oz (71.9 kg)  06/15/22 159 lb (72.1 kg)   BMI Readings from Last 3 Encounters:  07/19/22 26.26 kg/m  06/22/22 26.39 kg/m  06/15/22 27.29 kg/m    No Known Allergies  Medications Reviewed Today     Reviewed by Kassie Mends, RN (Registered Nurse) on 07/30/22 at 49  Med List Status: <None>   Medication Order Taking? Sig Documenting Provider Last Dose Status Informant  amLODipine (NORVASC) 5 MG tablet [id number] Yes Take 1 tablet (5 mg total) by mouth daily. Bonnita Hollow, MD Taking Active   blood glucose meter kit and supplies [id number] Yes Dispense based on patient and insurance preference. Use up to four times daily as directed. (FOR ICD-10 E10.9, E11.9). Bonnita Hollow, MD Taking Active   citalopram (CELEXA) 40 MG tablet [id number] Yes Take 1 tablet (40 mg total) by mouth daily. Bonnita Hollow, MD Taking Active   dapagliflozin propanediol (FARXIGA) 10 MG TABS tablet [id number] Yes Take 1 tablet (10 mg total) by mouth daily before breakfast. Patient receives via [name] Patient Assistance Bonnita Hollow, MD Taking Active   fluticasone [facility]) 50 MCG/ACT nasal spray [id number] Yes Place 2 sprays into both nostrils daily as needed for allergies or rhinitis. [provider] Taking Active   gabapentin (NEURONTIN) 600 MG tablet [id number] Yes Take 1 tablet (600 mg total) by mouth 2 (two) times daily. Bonnita Hollow, MD Taking Active   glucose blood test strip [id number] Yes Use as instructed Bonnita Hollow, MD Taking Active   hydroxychloroquine (PLAQUENIL) 200 MG tablet [id number] No Take 200mg  by mouth twice daily, Monday through Friday only. None on Saturday or Sunday.  Patient not  taking: Reported on 07/19/2022   Bo Merino, MD Not Taking Active   ibuprofen (ADVIL) 200 MG tablet [id number] Yes Take 200 mg by mouth every 6 (six) hours as needed for mild pain or headache. [provider] Taking Active   Insulin Pen Needle 32G X 4 MM MISC [id number] Yes Use with insulin once daily  DX R73.03 and Z79.4 [provider]  Taking Active   Lancets MISC [id number] Yes 1 each by Does not apply route in the morning, at noon, in the evening, and at bedtime. Bonnita Hollow, MD Taking Active   meclizine (ANTIVERT) 12.5 MG tablet [id number] No Take 1 tablet (12.5 mg total) by mouth 3 (three) times daily as needed for dizziness.  Patient not taking: Reported on 07/30/2022   Bonnita Hollow, MD Not Taking Active   rosuvastatin (CRESTOR) 10 MG tablet [id number] Yes Take 1 tablet (10 mg total) by mouth at bedtime. Bonnita Hollow, MD Taking Active   Turmeric 500 MG CAPS [id number] No Take 500 mg by mouth daily.  Patient not taking: Reported on 07/30/2022   [provider] Not Taking Active             SDOH:  (Social Determinants of Health) assessments and interventions performed: Yes SDOH Interventions    [place] Coordination from 08/06/2022 in [place] Management from 07/30/2022 in [place] at [place] Telephone from 06/28/2022 in [place] from 03/09/2022 in [place] at [place] Management from 12/17/2021 in [place] at [place] Interventions -- Intervention Not Indicated -- Intervention Not Indicated --  Housing Interventions -- Intervention Not Indicated Intervention Not Indicated Intervention Not Indicated --  Transportation Interventions -- Intervention Not Indicated Intervention Not  Indicated Intervention Not Indicated Intervention Not Indicated  Utilities Interventions -- Intervention Not Indicated -- -- --  Financial Strain Interventions -- Intervention Not Indicated -- Intervention Not Indicated Other (Comment)  [PAP]  Physical Activity Interventions -- Intervention Not Indicated -- Intervention Not Indicated --  Stress Interventions Provide Counseling Other (Comment)  [Social work referral for caregiver stress] -- Intervention Not Indicated --  Social Connections Interventions -- Patient Refused -- Intervention Not Indicated --       Medication Assistance: None required.  Patient affirms current coverage meets needs.  Medication Access: Within the past 30 days, how often has patient missed a dose of medication? None Is a pillbox or other method used to improve adherence? Yes  Factors that may affect medication adherence? no barriers identified Are meds synced by current pharmacy? Yes  Are meds delivered by current pharmacy? Yes  Does patient experience delays in picking up medications due to transportation concerns? No   [facility] Reviewed: Current Rx insurance plan: [name] Name and location of Current pharmacy:  [name] - [place], [place] - [place] [facility] [address] [place] [address] Phone: [phone] Fax: [place], [place]. [place] [address] Phone: [phone] Fax: [phone]  Compliance/Adherence/Medication fill history: Care Gaps: Covid Vaccine Hep C  Tdap  Shingrix   Star-Rating Drugs: Rosuvastatin 10 mg: Last filled 08/05/22 for 30-DS   Assessment/Plan  Hypertension (BP goal <130/80) -Controlled -Current treatment: Amlodipine 5 mg daily: Appropriate, Effective, Safe, Accessible -Medications previously tried: NA  -Current home readings: Does not monitor routinely at home.  -Denies hypotensive/hypertensive  symptoms -Recommended to continue current medication  Hyperlipidemia: (LDL goal < 70) -Controlled -Current treatment: Rosuvastatin 10 mg nightly: Appropriate, Effective, Safe, Accessible  -Medications previously tried: NA  -Recommended to continue current medication  Diabetes (A1c goal <7%) -Uncontrolled, but improving.  -Diagnosed in 30s  -  Current medications: Farxiga 10 mg daily: Appropriate, Query Effective -Medications previously tried: Metformin (GI), Tyler Aas  -Current home glucose readings: checking at bedtime  120-130s   -Denies hypoglycemia symptoms.  -Current meal patterns: Working with dietician  -Current exercise: Walking 3-4x daily  -Continue current medications  Anxiety (Goal: Maintain remission of symptoms) -Controlled -Current treatment: Citalopram 40 mg daily: Appropriate, Effective, Query Safe -Medications previously tried/failed: NA -GAD7: NA -Recommended to continue current medication  Chronic Pain (Goal: [place] pain) -Uncontrolled -Osteoarthritis in knees, neuropathy in legs, possible rheumatologic illness in hands.  -Current treatment  Gabapentin 600 mg twice daily: Appropriate, Effective, Safe, Accessible  -Medications previously tried: NA   -Continues to struggle with swelling and stiffness in hands. She feels her swelling is slightly better recently and varies significantly with the weather. She has chosen not to start her hydroxychloroquine due to concerns over the side effects of the medication and didn't think the benefits outweighed the risk of the medication at this time.  -Discussed the progressive nature of rheumatoid arthritis and to inform rheumatology if her inflammation or swelling symptoms worsen.   [name], PharmD, [place], CPP Clinical Pharmacist Practitioner  [facility]  [phone]

## 2022-08-12 NOTE — Patient Outreach (Signed)
  Care Coordination  Follow Up Visit Note   08/12/2022 Name: Kelli Ruiz MRN: [id number] DOB: [birth year]  Kelli Ruiz is a 67 y.o. year old female who sees Bonnita Hollow, MD for primary care. I spoke with  Kelli Ruiz by phone today.  What matters to the patients health and wellness today?  Managing stress and getting resources for sister  Patient is making progress, they have applied for Medicaid and Social Security for her sister.  Main focus today is coping skill to help patient with managing in a stressful situation. See task below to assist with accomplishing her goal.   Goals Addressed             This Visit's Progress    Connect with community support for patient and sister's well being       Activities and task to complete in order to accomplish goals.   Complete Advance Directive packet,  Continue with your current coping skills  Explore the support options we discussed below Joining the [facility] [phone] [address], [place]    [email or web address]   [employer] in person at [employer] center and virtual 12:30 to 2:00   [employer] on [place] ([place]) [place]- Wellness classes, Support groups        [address] [place], [place] [address]  [email or web address]           SDOH assessments and interventions completed:  No   Care Coordination Interventions:  Yes, provided  Interventions Today    Flowsheet Row Most Recent Value  Chronic Disease   Chronic disease during today's visit Hypertension (HTN), Diabetes  General Interventions   General Interventions Discussed/Reviewed General Interventions Reviewed, [employer], Communication with  Communication with --  [mental health agencies to find options for patient]  Applications Medicaid  [has submitted application for sister ( Medicaid and Social Security)]  Exercise Interventions   Exercise  Discussed/Reviewed Physical Activity  Physical Activity Discussed/Reviewed Home Exercise Program (HEP)  [enjoys walking dog,  discussed joining the [employer] program]  Education Interventions   Education Provided Provided Education  Provided Verbal Education On [employer], Dentist  [has submitted application for sister ( Medicaid and Social Security)]  [place] Reviewed, Coping Strategies  [cargiver support ,  support group options,  solution focus and emotional support provided]       Follow up plan: Follow up call scheduled for 08/23/22    Encounter Outcome:  Pt. Visit Completed   Casimer Lanius, [place] [phone]

## 2022-08-12 NOTE — Patient Instructions (Signed)
Visit Information  Thank you for taking time to visit with me today. Please don't hesitate to contact me if I can be of assistance to you.   Following are the goals we discussed today:   Goals Addressed             This Visit's Progress    Connect with community support for patient and sister's well being       Activities and task to complete in order to accomplish goals.   Complete Advance Directive packet,  Continue with your current coping skills  Explore the support options we discussed below Joining the [facility] [phone] [address], [place]    [email or web address]   [employer] in person at [employer] center and virtual 12:30 to 2:00   [employer] on [place] ([place]) [place]- Wellness classes, Support groups        [address], [place], [place] [address]  [email or web address]            Our next appointment is by telephone on 08/23/22 at 10:30  Please call the care guide team at [phone] if you need to cancel or reschedule your appointment.   If you are experiencing a Mental Health or [place] or need someone to talk to, please call the Suicide and Crisis Lifeline: 988 call the [place] National Suicide Prevention Lifeline: 469 186 4562 or TTY: (603)881-3476 TTY 401-315-5514) to talk to a trained counselor call 1-800-273-TALK (toll free, 24 hour hotline) go to [facility] [address] [place] [phone])   Patient verbalizes understanding of instructions and care plan provided today and agrees to view in [place]. Active MyChart status and patient understanding of how to access instructions and care plan via MyChart confirmed with patient.     Casimer Lanius, [place] [phone]

## 2022-08-15 DIAGNOSIS — Z794 Long term (current) use of insulin: Secondary | ICD-10-CM

## 2022-08-15 DIAGNOSIS — I10 Essential (primary) hypertension: Secondary | ICD-10-CM

## 2022-08-15 DIAGNOSIS — E1159 Type 2 diabetes mellitus with other circulatory complications: Secondary | ICD-10-CM

## 2022-08-16 ENCOUNTER — Telehealth: Payer: Self-pay | Admitting: *Deleted

## 2022-08-16 NOTE — Progress Notes (Unsigned)
Office Visit Note  Patient: Kelli Ruiz             Date of Birth: [birth year]           MRN: [id number]             PCP: Bonnita Hollow, MD Referring: Bonnita Hollow, MD Visit Date: 08/17/2022 Occupation: @GUAROCC @  Subjective:  Pain in joints  History of Present Illness: Kelli Ruiz is a 67 y.o. female with seronegative rheumatoid arthritis.  She was placed on hydroxychloroquine 200 mg p.o. twice daily Monday to Friday at the last visit.  Patient states she was anxious about the medication and did not start it.  She states the day after her visit she had an episode of vertigo.  She was evaluated in the emergency room and then was seen by her PCP.  She was told that it was most likely related to hypoglycemia.  She states all symptoms resolved and had no recurrence.  She continues to have some stiffness and some discomfort in her hands.  She has intermittent discomfort in her wrist joints and her knees.  Patient had eye examination by Dr. Wyatt Portela on August 05, 2022 which was within normal limits.    Activities of Daily Living:  Patient reports morning stiffness for 0 minute.   Patient Denies nocturnal pain.  Difficulty dressing/grooming: Denies Difficulty climbing stairs: Denies Difficulty getting out of chair: Denies Difficulty using hands for taps, buttons, cutlery, and/or writing: Reports  Review of Systems  Constitutional:  Positive for fatigue.  HENT:  Positive for mouth dryness. Negative for mouth sores.   Eyes:  Positive for dryness.  Respiratory:  Negative for shortness of breath.   Cardiovascular:  Negative for chest pain and palpitations.  Gastrointestinal:  Positive for constipation. Negative for blood in stool and diarrhea.  Endocrine: Positive for increased urination.  Genitourinary:  Negative for involuntary urination.  Musculoskeletal:  Positive for joint pain and joint pain. Negative for gait problem, joint swelling, myalgias, muscle weakness, morning  stiffness, muscle tenderness and myalgias.  Skin:  Negative for color change, rash, hair loss and sensitivity to sunlight.  Allergic/Immunologic: Negative for susceptible to infections.  Neurological:  Negative for dizziness and headaches.  Hematological:  Negative for swollen glands.  Psychiatric/Behavioral:  Negative for depressed mood and sleep disturbance. The patient is not nervous/anxious.     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   Rheumatoid arthritis involving multiple sites 07/19/2022   Bilateral lower extremity edema 04/18/2022   Excessive daytime sleepiness 01/25/2022   OSA on CPAP 01/25/2022   Non-restorative sleep 01/25/2022   RLS (restless legs syndrome) 01/25/2022   Diabetes mellitus 11/23/2021   Hypertension associated with diabetes 11/23/2021   Hyperlipidemia associated with type 2 diabetes mellitus 11/23/2021   Tremor 11/23/2021   OSA (obstructive sleep apnea) 11/23/2021   Swelling of both hands 11/23/2021   S/P conversion right knee 01/18/2017    Past Medical History:  Diagnosis Date   Arthritis    Complication of anesthesia    " they had a little bit of a hard time waking me after they took a nerve out of my leg"    Depression    Diabetes mellitus without complication    Hypertension    Neuromuscular disorder    Obese 01/19/2017   RLS (restless legs syndrome)    Sleep  apnea    Vertigo     Family History  Problem Relation Age of Onset   Diabetes Mother    Heart attack Mother    Kidney disease Mother    Arthritis Father    Stroke Father    Hypertension Father    Cancer Sister    Dementia Sister    Schizophrenia Sister    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;   Surgeon: Paralee Cancel, MD;  Location: [facility];  Service: Orthopedics;  Laterality: Right;  90 mins with regional block   DILATION AND CURETTAGE OF UTERUS  12- 2014 or 2015   TONSILLECTOMY     unicompartmental knee  2016   Social History   Social History Narrative   Not on file    There is no immunization history on file for this patient.   Objective: Vital Signs: BP 120/64 (BP Location: Left Arm, Patient Position: Sitting, Cuff Size: Normal)   Pulse 71   Resp 14   Ht 5\' 4"  (1.626 m)   Wt 158 lb (71.7 kg)   BMI 27.12 kg/m    Physical Exam Vitals and nursing note reviewed.  Constitutional:      Appearance: She is well-developed.  HENT:     Head: Normocephalic and atraumatic.  Eyes:     Conjunctiva/sclera: Conjunctivae normal.  Cardiovascular:     Rate and Rhythm: Normal rate and regular rhythm.     Heart sounds: Normal heart sounds.  Pulmonary:     Effort: Pulmonary effort is normal.     Breath sounds: Normal breath sounds.  Abdominal:     General: Bowel sounds are normal.     Palpations: Abdomen is soft.  Musculoskeletal:     Cervical back: Normal range of motion.  Lymphadenopathy:     Cervical: No cervical adenopathy.  Skin:    General: Skin is warm and dry.     Capillary Refill: Capillary refill takes less than 2 seconds.  Neurological:     Mental Status: She is alert and oriented to person, place, and time.  Psychiatric:        Behavior: Behavior normal.      Musculoskeletal Exam: C-spine was in good range of motion.  Shoulder joints, elbow joints, wrist joints were in good range of motion.  She had no tenderness over right first MCP and mild synovitis over bilateral second and third MCP joints.  She had some discomfort range of motion of her knee joints.  No warmth swelling or effusion was noted.  CDAI Exam: CDAI Score: 8  Patient Global: 5 mm; Provider Global: 5 mm Swollen: 3 ; Tender: 4  Joint Exam 08/17/2022      Right  Left  MCP 1   Tender      MCP 2  Swollen Tender  Swollen Tender  MCP 3  Swollen Tender        Investigation: No additional findings.  Imaging: No results found.  Recent Labs: Lab Results  Component Value Date   WBC 6.5 06/25/2022   HGB 16.9 (H) 06/25/2022   PLT 267 06/25/2022   NA 139 06/25/2022   K 4.1 06/25/2022   CL 102 06/25/2022   CO2 26 06/25/2022   GLUCOSE 114 (H) 06/25/2022   BUN 15 06/25/2022   CREATININE 0.92 06/25/2022   BILITOT 0.8 06/25/2022  ALKPHOS 62 06/25/2022   AST 37 06/25/2022   ALT 42 06/25/2022   PROT 8.6 (H) 06/25/2022   ALBUMIN 4.5 06/25/2022   CALCIUM 9.7 06/25/2022   GFRAA >60 01/19/2017    Speciality Comments: PLQ Eye Exam: 08/05/2022 WNL @ [facility] Follow up in 1 year  Procedures:  No procedures performed Allergies: Bee pollen   Assessment / Plan:     Visit Diagnoses: Rheumatoid arthritis of multiple sites with negative rheumatoid factor - RF negative, anti-CCP negative,ANA -,elevated ESR .  Patient was given a prescription for Plaquenil 200 mg p.o. twice daily Monday to Friday on June 22, 2022.  Patient decided not to take hydroxychloroquine.  Patient states that she has a sister with dementia who lives with her at home.  She does not want to take any medications at this point which can cause side effects and interfere with her role as a provider.  She is concerned about the side effects of hydroxychloroquine.  She states her symptoms are manageable with topical Voltaren gel and ibuprofen over-the-counter.  I advised patient to contact us if she changes her mind.  If she develops any increased swelling she should contact us.  She has a prescription for hydroxychloroquine at home.  Pain in both hands - Synovitis was noted over bilateral second and third MCP joints today.  X-rays were consistent with rheumatoid arthritis and osteoarthritis overlap.  X-ray findings were discussed with the patient.  She will hold off immunosuppressive agents at this  point.  She will contact us if the symptoms get worse.  Primary osteoarthritis of both feet-she continues to have some discomfort in her feet.  Status post revision of total knee, right - partial knee replacement in 2016 and then revision in 01/2017  DDD (degenerative disc disease), cervical - Degenerative disc disease and facet joint arthropathy with most significant narrowing between C5-C6 and C6-C7  DDD (degenerative disc disease), lumbar-she has off-and-on discomfort.  Hypertension associated with diabetes-blood pressure was normal at 120/64 today.  Other medical problems are listed as follows:  Type 2 diabetes mellitus with hyperglycemia, without long-term current use of insulin  Hyperlipidemia associated with type 2 diabetes mellitus  Bilateral lower extremity edema  RLS (restless legs syndrome)  Non-restorative sleep  OSA on CPAP  Orders: No orders of the defined types were placed in this encounter.  No orders of the defined types were placed in this encounter.   Follow-Up Instructions: Return in 6 months (on 02/16/2023) for Rheumatoid arthritis.   Bo Merino, MD  Note - This record has been created using Editor, commissioning.  Chart creation errors have been sought, but may not always  have been located. Such creation errors do not reflect on  the standard of medical care.

## 2022-08-16 NOTE — Telephone Encounter (Signed)
-----   Message from Bo Merino, MD sent at 08/12/2022  4:44 PM EDT ----- Seth Bake, Please reach out to the patient and advise a follow-up visit to discuss other treatment options that she is not interested in starting Plaquenil.  I do not see a follow-up appointment scheduled. Thank you, Bo Merino, MD  ----- Message ----- From: Germaine Pomfret, [facility] Sent: 08/12/2022   9:42 AM EDT To: Bo Merino, MD  Dr. Estanislado Pandy,   I spoke with your patient today for medication consult. She told me during our visit that she does not plan to start hydroxychloroquine at this time due to a fear of side effects with the medication. I asked her to reach out to you if her swelling and inflammation worsens and she wants to re-discuss treatment options.   Thanks, [name], PharmD, Para March, CPP Clinical Pharmacist Practitioner  [facility]  [phone]

## 2022-08-16 NOTE — Telephone Encounter (Signed)
Called patient and scheduled an appointment for 08/17/2022 at 11:00 am.

## 2022-08-17 ENCOUNTER — Encounter: Payer: Self-pay | Admitting: Rheumatology

## 2022-08-17 ENCOUNTER — Ambulatory Visit: Payer: Medicare Other | Attending: Rheumatology | Admitting: Rheumatology

## 2022-08-17 VITALS — BP 120/64 | HR 71 | Resp 14 | Ht 64.0 in | Wt 158.0 lb

## 2022-08-17 DIAGNOSIS — E785 Hyperlipidemia, unspecified: Secondary | ICD-10-CM

## 2022-08-17 DIAGNOSIS — G4733 Obstructive sleep apnea (adult) (pediatric): Secondary | ICD-10-CM

## 2022-08-17 DIAGNOSIS — M79641 Pain in right hand: Secondary | ICD-10-CM

## 2022-08-17 DIAGNOSIS — R600 Localized edema: Secondary | ICD-10-CM | POA: Diagnosis not present

## 2022-08-17 DIAGNOSIS — E1165 Type 2 diabetes mellitus with hyperglycemia: Secondary | ICD-10-CM

## 2022-08-17 DIAGNOSIS — G478 Other sleep disorders: Secondary | ICD-10-CM | POA: Diagnosis not present

## 2022-08-17 DIAGNOSIS — M5030 Other cervical disc degeneration, unspecified cervical region: Secondary | ICD-10-CM | POA: Diagnosis not present

## 2022-08-17 DIAGNOSIS — I152 Hypertension secondary to endocrine disorders: Secondary | ICD-10-CM

## 2022-08-17 DIAGNOSIS — M5136 Other intervertebral disc degeneration, lumbar region: Secondary | ICD-10-CM

## 2022-08-17 DIAGNOSIS — Z79899 Other long term (current) drug therapy: Secondary | ICD-10-CM

## 2022-08-17 DIAGNOSIS — Z96651 Presence of right artificial knee joint: Secondary | ICD-10-CM | POA: Diagnosis not present

## 2022-08-17 DIAGNOSIS — M0609 Rheumatoid arthritis without rheumatoid factor, multiple sites: Secondary | ICD-10-CM | POA: Diagnosis not present

## 2022-08-17 DIAGNOSIS — M19071 Primary osteoarthritis, right ankle and foot: Secondary | ICD-10-CM

## 2022-08-17 DIAGNOSIS — M19072 Primary osteoarthritis, left ankle and foot: Secondary | ICD-10-CM

## 2022-08-17 DIAGNOSIS — E1159 Type 2 diabetes mellitus with other circulatory complications: Secondary | ICD-10-CM

## 2022-08-17 DIAGNOSIS — M79642 Pain in left hand: Secondary | ICD-10-CM

## 2022-08-17 DIAGNOSIS — E1169 Type 2 diabetes mellitus with other specified complication: Secondary | ICD-10-CM

## 2022-08-17 DIAGNOSIS — G2581 Restless legs syndrome: Secondary | ICD-10-CM | POA: Diagnosis not present

## 2022-08-18 DIAGNOSIS — G4733 Obstructive sleep apnea (adult) (pediatric): Secondary | ICD-10-CM | POA: Diagnosis not present

## 2022-08-23 ENCOUNTER — Ambulatory Visit: Payer: Self-pay | Admitting: Licensed Clinical Social Worker

## 2022-08-23 NOTE — Patient Instructions (Signed)
Visit Information  Thank you for taking time to visit with me today. Please don't hesitate to contact me if I can be of assistance to you.   Following are the goals we discussed today:   Goals Addressed             This Visit's Progress    Reduce Stress related to family member       Activities and task to complete in order to accomplish goals.   You have decided not to move forward with counseling and would like to work with me on brief coping skills to assist you with managing stress. We will revisit to see if you would like to explore therapy during out next encounter Continue with compliance of taking medication prescribed by Doctor Self Support options  (looking into [employer], planning your day trip, connect with old friends and dog walking group)        Our next appointment is by telephone on 09/14/22 at 9:15  Please call the care guide team at [phone] if you need to cancel or reschedule your appointment.   If you are experiencing a Mental Health or Behavioral Health Crisis or need someone to talk to, please call the Suicide and Crisis Lifeline: 988 call the [place] National Suicide Prevention Lifeline: (337)111-4022 or TTY: (854) 289-8934 TTY (216) 294-4569) to talk to a trained counselor call 1-800-273-TALK (toll free, 24 hour hotline) go to [facility] [address] [place] [phone])   Patient verbalizes understanding of instructions and care plan provided today and agrees to view in MyChart. Active MyChart status and patient understanding of how to access instructions and care plan via MyChart confirmed with patient.     Sammuel Hines, LCSW Social Work Care Coordination  [facility] [employer] [phone]

## 2022-08-23 NOTE — Patient Outreach (Signed)
  Care Coordination  Follow Up Visit Note   08/23/2022 Name: Kelli Ruiz MRN: [id number] DOB: January 17, 1956  Kelli Ruiz is a 67 y.o. year old female who sees [name], MD for primary care. I spoke with  Kelli Ruiz by phone today.  What matters to the patients health and wellness today?  Reducing stress  Patient is making progress with managing her stress. She reports today that she realizes after our last encounter there are a few things she needs to change related to managing her stress. She has decided not to move forward with group therapy at this time but willing to discuss individual therapy as an option at our next encounter.  Goals Addressed             This Visit's Progress    Reduce Stress related to family member       Activities and task to complete in order to accomplish goals.   You have decided not to move forward with counseling and would like to work with me on brief coping skills to assist you with managing stress. We will revisit to see if you would like to explore therapy during out next encounter Continue with compliance of taking medication prescribed by Doctor Self Support options  (looking into [employer], planning your day trip, connect with old friends and dog walking group)       SDOH assessments and interventions completed:  No   Care Coordination Interventions:  Yes, provided  Interventions Today    Flowsheet Row Most Recent Value  Chronic Disease   Chronic disease during today's visit Diabetes, Hypertension (HTN)  General Interventions   General Interventions Discussed/Reviewed General Interventions Reviewed  Exercise Interventions   Physical Activity Discussed/Reviewed Physical Activity Reviewed  Mental Health Interventions   Mental Health Discussed/Reviewed Mental Health Reviewed, Anxiety, Coping Strategies  [solution focused, task centered, behavioral activation, emotional support]  Advanced Directive Interventions   Advanced Directives  Discussed/Reviewed Advanced Directives Reviewed  [has not made progress at this time]       Follow up plan: Follow up call scheduled for 09/14/22    Encounter Outcome:  Pt. Visit Completed   Sammuel Hines, LCSW Social Work Care Coordination  [facility] [employer] [phone]

## 2022-08-27 ENCOUNTER — Telehealth: Payer: Self-pay

## 2022-08-27 DIAGNOSIS — E1165 Type 2 diabetes mellitus with hyperglycemia: Secondary | ICD-10-CM

## 2022-08-27 NOTE — Progress Notes (Signed)
Care Management & Coordination Services Pharmacy Team  Reason for Encounter: Medication coordination and delivery  Contacted patient to discuss medications and coordinate delivery from [name].  Spoke with patient on 08/27/2022   Cycle dispensing form sent to [name] for review.   Last adherence delivery date:08/10/2022      Patient is due for next adherence delivery on: 09/08/2022  This delivery to include: Adherence Packaging  30 Days  Gabapentin 600 mg 1 tablet two time daily - Breakfast, Bedtime Amlodipine 5 mg 1 tablet daily - Breakfast Citalopram 40 mg 1 tablet daily - Breakfast Rosuvastatin 10 mg 1 tablet daily - Bedtime  Patient declined the following medications this month: Farxiga 10 mg - receives patient assistance from [name]  Meclizine 12.5 mg 1 tablet daily PRN Accu-Check Lancets Misc - adequate supply Accu-Check Glucose test strips - adequate supply  Refills requested from providers include: Gabapentin prescribe by PCP  Confirmed delivery date of 09/08/2022 ([facility]), advised patient that pharmacy will contact them the morning of delivery.   Any concerns about your medications? No  How often do you forget or accidentally miss a dose? Never  Do you use a pillbox? No  Is patient in packaging Yes  If yes  What is the date on your next pill pack?  Any concerns or issues with your packaging?   Recent blood pressure readings are as follows:None ID  Recent blood glucose readings are as follows:None ID  Star Rating drugs: Farxiga 10 mg Patient receives via [name] Patient Assistance  Rosuvastatin 10 mg last filled 08/05/2022 30 Day supply at [facility].  Care Gaps: COVID-19 Vaccine Hepatitis C Screening Dtap Vaccine Shingrix vaccine  Chart review: Recent office visits:  08/23/2022 Sammuel Hines LCSW (Care Coordination) No Medication Changes noted  08/12/2022 Sammuel Hines LCSW (Care Coordination) No Medication Changes  noted  Recent consult visits:  08/17/2022 Dr. Corliss Skains MD (Rheumatology)Patient decided not to take hydroxychloroquine  Hospital visits:  None in previous 6 months  Medications: Outpatient Encounter Medications as of 08/27/2022  Medication Sig   amLODipine (NORVASC) 5 MG tablet Take 1 tablet (5 mg total) by mouth daily.   blood glucose meter kit and supplies Dispense based on patient and insurance preference. Use up to four times daily as directed. (FOR ICD-10 E10.9, E11.9).   citalopram (CELEXA) 40 MG tablet Take 1 tablet (40 mg total) by mouth daily.   dapagliflozin propanediol (FARXIGA) 10 MG TABS tablet Take 1 tablet (10 mg total) by mouth daily before breakfast. Patient receives via [name] Patient Assistance   fluticasone (FLONASE) 50 MCG/ACT nasal spray Place 2 sprays into both nostrils daily as needed for allergies or rhinitis.   gabapentin (NEURONTIN) 600 MG tablet Take 1 tablet (600 mg total) by mouth 2 (two) times daily.   glucose blood test strip Use as instructed   ibuprofen (ADVIL) 200 MG tablet Take 200 mg by mouth every 6 (six) hours as needed for mild pain or headache.   Insulin Pen Needle 32G X 4 MM MISC Use with insulin once daily  DX R73.03 and Z79.4   Lancets MISC 1 each by Does not apply route in the morning, at noon, in the evening, and at bedtime.   meclizine (ANTIVERT) 12.5 MG tablet Take 1 tablet (12.5 mg total) by mouth 3 (three) times daily as needed for dizziness.   rosuvastatin (CRESTOR) 10 MG tablet Take 1 tablet (10 mg total) by mouth at bedtime.   Turmeric 500 MG CAPS Take 500 mg  by mouth daily. (Patient not taking: Reported on 07/30/2022)   No facility-administered encounter medications on file as of 08/27/2022.   BP Readings from Last 3 Encounters:  08/17/22 120/64  07/19/22 122/84  06/25/22 (!) 145/90    Pulse Readings from Last 3 Encounters:  08/17/22 71  07/19/22 77  06/25/22 85    Lab Results  Component Value Date/Time   HGBA1C 6.7 (A)  07/19/2022 10:25 AM   HGBA1C 7.3 (A) 04/13/2022 10:15 AM   HGBA1C 7.9 (H) 11/30/2021 11:30 AM   HGBA1C 6.7 (H) 11/29/2016 10:57 AM   Lab Results  Component Value Date   CREATININE 0.92 06/25/2022   BUN 15 06/25/2022   GFRNONAA >60 06/25/2022   GFRAA >60 01/19/2017   NA 139 06/25/2022   K 4.1 06/25/2022   CALCIUM 9.7 06/25/2022   CO2 26 06/25/2022     Bessie Kellihan,CPA Clinical Pharmacist Assistant [phone]

## 2022-09-03 MED ORDER — GABAPENTIN 600 MG PO TABS: 600.0000 mg | ORAL_TABLET | Freq: Two times a day (BID) | ORAL | 1 refills | Status: DC

## 2022-09-14 ENCOUNTER — Ambulatory Visit: Payer: Self-pay | Admitting: Licensed Clinical Social Worker

## 2022-09-14 NOTE — Patient Instructions (Signed)
Visit Information  Thank you for taking time to visit with me today. Please don't hesitate to contact me if I can be of assistance to you.   Following are the goals we discussed today:   Goals Addressed             This Visit's Progress    Reduce Stress related to family member       Activities and task to complete in order to accomplish goals.   Contact your insurance provider to inquire about your co-pay for counseling  Continue with compliance of taking medication prescribed by Doctor Self Support options  (looking into [employer], planning your day trip, connect with old friends and dog walking group)        Our next appointment is by telephone on 09/23/22 at 10:00  Please call the care guide team at [phone] if you need to cancel or reschedule your appointment.   If you are experiencing a Mental Health or Behavioral Health Crisis or need someone to talk to, please call the Suicide and Crisis Lifeline: 988 call the [place] National Suicide Prevention Lifeline: (706) 468-7848 or TTY: (810) 621-5984 TTY [phone]) to talk to a trained counselor call 1-800-273-TALK (toll free, 24 hour hotline) go to [facility] [address] [place] [phone])   Patient verbalizes understanding of instructions and care plan provided today and agrees to view in MyChart. Active MyChart status and patient understanding of how to access instructions and care plan via MyChart confirmed with patient.     Sammuel Hines, LCSW Social Work Care Coordination  [facility] [employer] [phone]

## 2022-09-14 NOTE — Patient Outreach (Signed)
  Care Coordination  Follow Up Visit Note   09/14/2022 Name: REJINA ODLE MRN: [id number] DOB: [birth year]  Dianna Limbo Rummell is a 67 y.o. year old female who sees [name], MD for primary care. I spoke with  Shaquitta A Fannin by phone today.  What matters to the patients health and wellness today?  Managing stress at home  Patient is making progress with managing her emotions and is now willing to consider going to counseling.   Goals Addressed             This Visit's Progress    Reduce Stress related to family member       Activities and task to complete in order to accomplish goals.   Contact your insurance provider to inquire about your co-pay for counseling  Continue with compliance of taking medication prescribed by Doctor Self Support options  (looking into [employer], planning your day trip, connect with old friends and dog walking group)       SDOH assessments and interventions completed:  No   Care Coordination Interventions:  Yes, provided  Interventions Today    Flowsheet Row Most Recent Value  Chronic Disease   Chronic disease during today's visit Diabetes, Hypertension (HTN)  General Interventions   General Interventions Discussed/Reviewed General Interventions Reviewed  Exercise Interventions   Exercise Discussed/Reviewed Exercise Reviewed  Physical Activity Discussed/Reviewed Home Exercise Program (HEP)  Mental Health Interventions   Mental Health Discussed/Reviewed Mental Health Reviewed, Anxiety  [active listening, solution focused, behavioral activation: discussed ongoing therapy options]  Advanced Directive Interventions   Advanced Directives Discussed/Reviewed Advanced Directives Discussed  [pending, has not moved forward with to complete]       Follow up plan: Follow up call scheduled for 09/23/22    Encounter Outcome:  Pt. Visit Completed   Sammuel Hines, LCSW Social Work Care Coordination  [facility] [employer] [phone]

## 2022-09-17 DIAGNOSIS — G4733 Obstructive sleep apnea (adult) (pediatric): Secondary | ICD-10-CM | POA: Diagnosis not present

## 2022-09-22 DIAGNOSIS — G4733 Obstructive sleep apnea (adult) (pediatric): Secondary | ICD-10-CM | POA: Diagnosis not present

## 2022-09-23 ENCOUNTER — Ambulatory Visit: Payer: Self-pay | Admitting: Licensed Clinical Social Worker

## 2022-09-23 DIAGNOSIS — Z636 Dependent relative needing care at home: Secondary | ICD-10-CM

## 2022-09-23 NOTE — Patient Instructions (Signed)
Visit Information  Thank you for taking time to visit with me today. Please don't hesitate to contact me if I can be of assistance to you.   Following are the goals we discussed today:   Goals Addressed             This Visit's Progress    Reduce Stress related to family member       Activities and task to complete in order to accomplish goals.   Continue with compliance of taking medication prescribed by Doctor Self Support options  (looking into [employer], connect with old friends and dog walking group) Review your EMMI educational information (Movement: Emotional Health and Relieving Stress) Look for an e-mail from [facility]. I have placed a referral with [facility] they will contact you.  You will need to complete the paper work before they will schedule your initial  appointment.        Our next appointment is by telephone on 10/14/22 at 10:00  Please call the care guide team at [phone] if you need to cancel or reschedule your appointment.   If you are experiencing a Mental Health or Behavioral Health Crisis or need someone to talk to, please call the Suicide and Crisis Lifeline: 988 call the [place] National Suicide Prevention Lifeline: 605-088-6848 or TTY: (646)818-1886 TTY (641)662-5032) to talk to a trained counselor call 1-800-273-TALK (toll free, 24 hour hotline) go to [facility] [address] [place] [phone])   Patient verbalizes understanding of instructions and care plan provided today and agrees to view in MyChart. Active MyChart status and patient understanding of how to access instructions and care plan via MyChart confirmed with patient.     Sammuel Hines, LCSW Social Work Care Coordination  [facility] [employer] [phone]

## 2022-09-23 NOTE — Patient Outreach (Signed)
  Care Coordination  Follow Up Visit Note   09/23/2022 Name: Kelli Ruiz MRN: [id number] DOB: [birth year]  Kelli Ruiz is a 67 y.o. year old female who sees Kelli Gunner, MD for primary care. I spoke with  Kelli Ruiz by phone today.  What matters to the patients health and wellness today?  Managing stress  Patient is making progress with managing stress and is ready to start therapy to take a deeper dive into her emotions.  She will continue to activities below until connected for therapy.   Goals Addressed             This Visit's Progress    Reduce Stress related to family member       Activities and task to complete in order to accomplish goals.   Continue with compliance of taking medication prescribed by Doctor Self Support options  (looking into [employer], connect with old friends and dog walking group) Review your EMMI educational information (Movement: Emotional Health and Relieving Stress) Look for an e-mail from [facility]. I have placed a referral with [facility] they will contact you.  You will need to complete the paper work before they will schedule your initial  appointment.       SDOH assessments and interventions completed:  No   Care Coordination Interventions:  Yes, provided  Interventions Today    Flowsheet Row Most Recent Value  Chronic Disease   Chronic disease during today's visit Hypertension (HTN), Diabetes  Exercise Interventions   Exercise Discussed/Reviewed Physical Activity  [discussed related to Behavioral Activation]  Education Interventions   Education Provided Provided Web-based Education  Provided Verbal Education On Mental Health/Coping with Illness  Mental Health Interventions   Mental Health Discussed/Reviewed Mental Health Reviewed, Coping Strategies  [solution focused, active listen, ( referral placed for therapy)]       Follow up plan:  Referral made to  Behavioral Medicine  Follow up  call scheduled for 3 weeks    Encounter Outcome:  Pt. Visit Completed   Kelli Hines, LCSW Social Work Care Coordination  [facility] Kelli Ru[facility] [employer] [phone]

## 2022-09-27 ENCOUNTER — Telehealth: Payer: Self-pay

## 2022-09-27 DIAGNOSIS — J302 Other seasonal allergic rhinitis: Secondary | ICD-10-CM

## 2022-09-27 MED ORDER — CETIRIZINE HCL 10 MG PO TABS: 10.0000 mg | ORAL_TABLET | Freq: Every day | ORAL | 1 refills | Status: DC

## 2022-09-27 MED ORDER — FLUTICASONE PROPIONATE 50 MCG/ACT NA SUSP: 2.0000 | Freq: Every day | NASAL | 3 refills | Status: DC | PRN

## 2022-09-27 NOTE — Progress Notes (Signed)
Care Management & Coordination Services Pharmacy Team  Reason for Encounter: Medication coordination and delivery  Contacted patient to discuss medications and coordinate delivery from [name].  Spoke with patient on 09/27/2022   Cycle dispensing form sent to [name], [facility] for review.   Last adherence delivery date:09/08/2022      Patient is due for next adherence delivery on: 10/07/2022  This delivery to include: Adherence Packaging  30 Days  Gabapentin 600 mg 1 tablet two time daily - Breakfast, Bedtime Amlodipine 5 mg 1 tablet daily - Breakfast Citalopram 40 mg 1 tablet daily - Breakfast Rosuvastatin 10 mg 1 tablet daily - Bedtime Accu-Check Lancets Misc - adequate supply Accu-Check Glucose test strips - adequate supply  Patient declined the following medications this month: Farxiga 10 mg - receives patient assistance from [name]  Meclizine 12.5 mg 1 tablet daily PRN   No refill request needed.  Confirmed delivery date of 10/07/2022 (Second route), advised patient that pharmacy will contact them the morning of delivery.   Any concerns about your medications? No  How often do you forget or accidentally miss a dose? Never  Do you use a pillbox? No  Is patient in packaging Yes  Recent blood pressure readings are as follows:None ID  Recent blood glucose readings are as follows:None ID  Patient states she is having issue with her allergies.Symptoms includes sneezing, running nose,and  watery eyes. Patient is asking if the clinical pharmacist can send a direct message to her PCP on her behalf asking if he can send a refill for Flonase and [facility]ine [facility]. Patient reports these medications has not been filled in years. Patient denies any fever,but states she want to avoid the drainage going to her lungs.Patient states she prefers not to go anywhere. Last time Cetirizine was filled was 01/03/2021 90 day supply and Flonase was  filled 04/13/2021  30 day supply.Patient states if the PCP is able to send in the prescription.She will like it to be deliver on 09/28/2022 or 09/29/2022.Notified Clinical pharmacist.   Star Rating drugs: Farxiga 10 mg Patient receives via [name] Patient Assistance  Rosuvastatin 10 mg last filled 09/03/2022 30 Day supply at [facility].   Care Gaps: COVID-19 Vaccine Hepatitis C Screening Dtap Vaccine Shingrix vaccine  Chart review: Recent office visits:  09/23/2022 Sammuel Hines LCSW (Care Coordination) No medication changes noted 09/14/2022 Sammuel Hines LCSW (Care Coordination) No medication changes noted  Recent consult visits:  None ID  Hospital visits:  None in previous 6 months  Medications: Outpatient Encounter Medications as of 09/27/2022  Medication Sig   amLODipine (NORVASC) 5 MG tablet Take 1 tablet (5 mg total) by mouth daily.   blood glucose meter kit and supplies Dispense based on patient and insurance preference. Use up to four times daily as directed. (FOR ICD-10 E10.9, E11.9).   citalopram (CELEXA) 40 MG tablet Take 1 tablet (40 mg total) by mouth daily.   dapagliflozin propanediol (FARXIGA) 10 MG TABS tablet Take 1 tablet (10 mg total) by mouth daily before breakfast. Patient receives via [name] Patient Assistance   fluticasone (FLONASE) 50 MCG/ACT nasal spray Place 2 sprays into both nostrils daily as needed for allergies or rhinitis.   gabapentin (NEURONTIN) 600 MG tablet Take 1 tablet (600 mg total) by mouth 2 (two) times daily.   glucose blood test strip Use as instructed   ibuprofen (ADVIL) 200 MG tablet Take 200 mg by mouth every 6 (six) hours as needed for mild pain or headache.  Insulin Pen Needle 32G X 4 MM MISC Use with insulin once daily  DX R73.03 and Z79.4   Lancets MISC 1 each by Does not apply route in the morning, at noon, in the evening, and at bedtime.   meclizine (ANTIVERT) 12.5 MG tablet Take 1 tablet (12.5 mg total) by mouth 3 (three) times daily as needed  for dizziness.   rosuvastatin (CRESTOR) 10 MG tablet Take 1 tablet (10 mg total) by mouth at bedtime.   Turmeric 500 MG CAPS Take 500 mg by mouth daily. (Patient not taking: Reported on 07/30/2022)   No facility-administered encounter medications on file as of 09/27/2022.   BP Readings from Last 3 Encounters:  08/17/22 120/64  07/19/22 122/84  06/25/22 (!) 145/90    Pulse Readings from Last 3 Encounters:  08/17/22 71  07/19/22 77  06/25/22 85    Lab Results  Component Value Date/Time   HGBA1C 6.7 (A) 07/19/2022 10:25 AM   HGBA1C 7.3 (A) 04/13/2022 10:15 AM   HGBA1C 7.9 (H) 11/30/2021 11:30 AM   HGBA1C 6.7 (H) 11/29/2016 10:57 AM   Lab Results  Component Value Date   CREATININE 0.92 06/25/2022   BUN 15 06/25/2022   GFRNONAA >60 06/25/2022   GFRAA >60 01/19/2017   NA 139 06/25/2022   K 4.1 06/25/2022   CALCIUM 9.7 06/25/2022   CO2 26 06/25/2022     Bessie Kellihan,CPA Clinical Pharmacist Assistant [phone]

## 2022-09-27 NOTE — Addendum Note (Signed)
Addended by: [name] A on: 09/27/2022 10:50 AM   Modules accepted: Orders

## 2022-09-29 ENCOUNTER — Telehealth: Payer: Medicare Other | Admitting: Physician Assistant

## 2022-09-29 DIAGNOSIS — J302 Other seasonal allergic rhinitis: Secondary | ICD-10-CM | POA: Diagnosis not present

## 2022-09-29 DIAGNOSIS — B9689 Other specified bacterial agents as the cause of diseases classified elsewhere: Secondary | ICD-10-CM | POA: Diagnosis not present

## 2022-09-29 DIAGNOSIS — J019 Acute sinusitis, unspecified: Secondary | ICD-10-CM | POA: Diagnosis not present

## 2022-09-29 MED ORDER — AMOXICILLIN-POT CLAVULANATE 875-125 MG PO TABS: 1.0000 | ORAL_TABLET | Freq: Two times a day (BID) | ORAL | 0 refills | Status: DC

## 2022-09-29 MED ORDER — CETIRIZINE HCL 10 MG PO TABS: 10.0000 mg | ORAL_TABLET | Freq: Every day | ORAL | 1 refills | Status: DC

## 2022-09-29 MED ORDER — FLUTICASONE PROPIONATE 50 MCG/ACT NA SUSP: 2.0000 | Freq: Every day | NASAL | 3 refills | Status: DC | PRN

## 2022-09-29 NOTE — Progress Notes (Signed)
Virtual Visit Consent   [name], you are scheduled for a virtual visit with a [facility] provider today. Just as with appointments in the office, your consent must be obtained to participate. Your consent will be active for this visit and any virtual visit you may have with one of our providers in the next 365 days. If you have a MyChart account, a copy of this consent can be sent to you electronically.  As this is a virtual visit, video technology does not allow for your provider to perform a traditional examination. This may limit your provider's ability to fully assess your condition. If your provider identifies any concerns that need to be evaluated in person or the need to arrange testing (such as labs, EKG, etc.), we will make arrangements to do so. Although advances in technology are sophisticated, we cannot ensure that it will always work on either your end or our end. If the connection with a video visit is poor, the visit may have to be switched to a telephone visit. With either a video or telephone visit, we are not always able to ensure that we have a secure connection.  By engaging in this virtual visit, you consent to the provision of healthcare and authorize for your insurance to be billed (if applicable) for the services provided during this visit. Depending on your insurance coverage, you may receive a charge related to this service.  I need to obtain your verbal consent now. Are you willing to proceed with your visit today? Kelli Ruiz has provided verbal consent on 09/29/2022 for a virtual visit (video or telephone). [name], PA-C  Date: 09/29/2022 7:10 PM  Virtual Visit via Video Note   I, [name], connected with  Kelli Ruiz  ([id number], 1956-02-18) on 09/29/22 at  7:15 PM EDT by a video-enabled telemedicine application and verified that I am speaking with the correct person using two identifiers.  Location: Patient: Virtual Visit Location  Patient: Home Provider: Virtual Visit Location Provider: Home Office   I discussed the limitations of evaluation and management by telemedicine and the availability of in person appointments. The patient expressed understanding and agreed to proceed.    History of Present Illness: Kelli Ruiz is a 67 y.o. who identifies as a female who was assigned female at birth, and is being seen today for URI symptoms.  HPI: URI  This is a new problem. The current episode started in the past 7 days. The problem has been gradually worsening. There has been no fever. Associated symptoms include congestion, coughing, headaches, a plugged ear sensation, rhinorrhea, sinus pain, sneezing and a sore throat (from post nasal drainage). Pertinent negatives include no diarrhea, ear pain, nausea, neck pain, swollen glands, vomiting or wheezing. Associated symptoms comments: General malaise, fatigue. She has tried acetaminophen and increased fluids for the symptoms. The treatment provided no relief.     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   Rheumatoid arthritis involving multiple sites (HCC) 07/19/2022   Bilateral lower extremity edema 04/18/2022   Excessive daytime sleepiness 01/25/2022   OSA on CPAP 01/25/2022   Non-restorative sleep 01/25/2022   RLS (restless legs syndrome) 01/25/2022   Diabetes mellitus (HCC) 11/23/2021   Hypertension associated with diabetes (HCC) 11/23/2021   Hyperlipidemia associated with type 2 diabetes mellitus (HCC) 11/23/2021   Tremor 11/23/2021   OSA (obstructive sleep  apnea) 11/23/2021   Swelling of both hands 11/23/2021   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 Use up to four times daily as directed. (FOR ICD-10 E10.9, E11.9)., Disp: 1 each, Rfl: 0   cetirizine (ALLERGY, CETIRIZINE,) 10 MG tablet, Take 1 tablet (10 mg total) by mouth daily., Disp: 90 tablet, Rfl: 1   citalopram (CELEXA) 40 MG tablet, Take 1 tablet (40 mg total) by mouth daily., Disp: 90 tablet, Rfl: 1   dapagliflozin propanediol (FARXIGA) 10 MG TABS tablet, Take 1 tablet (10 mg total) by mouth daily before breakfast. Patient receives via [name] Patient Assistance, Disp: 90 tablet, Rfl: 3   fluticasone (FLONASE) 50 MCG/ACT nasal spray, Place 2 sprays into both nostrils daily as needed for allergies or rhinitis., Disp: 16 g, Rfl: 3   gabapentin (NEURONTIN) 600 MG tablet, Take 1 tablet (600 mg total) by mouth 2 (two) times daily., Disp: 180 tablet, Rfl: 1   glucose blood test strip, Use as instructed, Disp: 100 each, Rfl: 12   ibuprofen (ADVIL) 200 MG tablet, Take 200 mg by mouth every 6 (six) hours as needed for mild pain or headache., Disp: , Rfl:    Insulin Pen Needle 32G X 4 MM MISC, Use with insulin once daily  DX R73.03 and Z79.4, Disp: , Rfl:    Lancets MISC, 1 each by Does not apply route in the morning, at noon, in the evening, and at bedtime., Disp: 100 each, Rfl: 12   meclizine (ANTIVERT) 12.5 MG tablet, Take 1 tablet (12.5 mg total) by mouth 3 (three) times daily as needed for dizziness., Disp: 30 tablet, Rfl: 0   rosuvastatin (CRESTOR) 10 MG tablet, Take 1 tablet (10 mg total) by mouth at bedtime., Disp: 90 tablet, Rfl: 3   Turmeric 500 MG CAPS, Take 500 mg by mouth daily. (Patient not taking: Reported on 07/30/2022), Disp: , Rfl:   Observations/Objective: Patient is well-developed, well-nourished in no acute distress.  Resting comfortably at home.  Head is  normocephalic, atraumatic.  No labored breathing.  Speech is clear and coherent with logical content.  Patient is alert and oriented at baseline.    Assessment and Plan: 1. Seasonal allergies - fluticasone (FLONASE) 50 MCG/ACT nasal spray; Place 2 sprays into both nostrils daily as needed for allergies or rhinitis.  Dispense: 16 g; Refill: 3 - cetirizine (ALLERGY, CETIRIZINE,) 10 MG tablet; Take 1 tablet (10 mg total) by mouth daily.  Dispense: 90 tablet; Refill: 1  2. Acute bacterial sinusitis - amoxicillin-clavulanate (AUGMENTIN) 875-125 MG tablet; Take 1 tablet by mouth 2 (two) times daily.  Dispense: 20 tablet; Refill: 0  - Worsening symptoms that have not responded to OTC medications.  - Will give Augmentin - Continue allergy medications.  - Steam and humidifier can help - Stay well hydrated and get plenty of rest.  - Seek in person evaluation if no symptom improvement or if symptoms worsen   Follow Up Instructions: I discussed the assessment and treatment plan with the patient. The patient was provided an opportunity to ask questions  and all were answered. The patient agreed with the plan and demonstrated an understanding of the instructions.  A copy of instructions were sent to the patient via MyChart unless otherwise noted below.    The patient was advised to call back or seek an in-person evaluation if the symptoms worsen or if the condition fails to improve as anticipated.  Time:  I spent 8 minutes with the patient via telehealth technology discussing the above problems/concerns.    [name], PA-C

## 2022-09-29 NOTE — Patient Instructions (Signed)
Kelli Ruiz, thank you for joining [name], PA-C for today's virtual visit.  While this provider is not your primary care provider (PCP), if your PCP is located in our provider database this encounter information will be shared with them immediately following your visit.   A [facility] MyChart account gives you access to today's visit and all your visits, tests, and labs performed at [facility] " click here if you don't have a [facility] MyChart account or go to [email or web address]  Consent: (Patient) Kelli Ruiz provided verbal consent for this virtual visit at the beginning of the encounter.  Current Medications:  Current Outpatient Medications:    amoxicillin-clavulanate (AUGMENTIN) 875-125 MG tablet, Take 1 tablet by mouth 2 (two) times daily., Disp: 20 tablet, Rfl: 0   amLODipine (NORVASC) 5 MG tablet, Take 1 tablet (5 mg total) by mouth daily., Disp: 90 tablet, Rfl: 1   blood glucose meter kit and supplies, Dispense based on patient and insurance preference. Use up to four times daily as directed. (FOR ICD-10 E10.9, E11.9)., Disp: 1 each, Rfl: 0   cetirizine (ALLERGY, CETIRIZINE,) 10 MG tablet, Take 1 tablet (10 mg total) by mouth daily., Disp: 90 tablet, Rfl: 1   citalopram (CELEXA) 40 MG tablet, Take 1 tablet (40 mg total) by mouth daily., Disp: 90 tablet, Rfl: 1   dapagliflozin propanediol (FARXIGA) 10 MG TABS tablet, Take 1 tablet (10 mg total) by mouth daily before breakfast. Patient receives via [name] Patient Assistance, Disp: 90 tablet, Rfl: 3   fluticasone (FLONASE) 50 MCG/ACT nasal spray, Place 2 sprays into both nostrils daily as needed for allergies or rhinitis., Disp: 16 g, Rfl: 3   gabapentin (NEURONTIN) 600 MG tablet, Take 1 tablet (600 mg total) by mouth 2 (two) times daily., Disp: 180 tablet, Rfl: 1   glucose blood test strip, Use as instructed, Disp: 100 each, Rfl: 12   ibuprofen (ADVIL) 200 MG tablet, Take 200 mg by mouth every 6 (six)  hours as needed for mild pain or headache., Disp: , Rfl:    Insulin Pen Needle 32G X 4 MM MISC, Use with insulin once daily  DX R73.03 and Z79.4, Disp: , Rfl:    Lancets MISC, 1 each by Does not apply route in the morning, at noon, in the evening, and at bedtime., Disp: 100 each, Rfl: 12   meclizine (ANTIVERT) 12.5 MG tablet, Take 1 tablet (12.5 mg total) by mouth 3 (three) times daily as needed for dizziness., Disp: 30 tablet, Rfl: 0   rosuvastatin (CRESTOR) 10 MG tablet, Take 1 tablet (10 mg total) by mouth at bedtime., Disp: 90 tablet, Rfl: 3   Turmeric 500 MG CAPS, Take 500 mg by mouth daily. (Patient not taking: Reported on 07/30/2022), Disp: , Rfl:    Medications ordered in this encounter:  Meds ordered this encounter  Medications   fluticasone (FLONASE) 50 MCG/ACT nasal spray    Sig: Place 2 sprays into both nostrils daily as needed for allergies or rhinitis.    Dispense:  16 g    Refill:  3    Order Specific Question:   Supervising Provider    Answer:   Merrilee Jansky [id number]   cetirizine (ALLERGY, CETIRIZINE,) 10 MG tablet    Sig: Take 1 tablet (10 mg total) by mouth daily.    Dispense:  90 tablet    Refill:  1    Order Specific Question:   Supervising Provider    Answer:   Leonides Grills,  PHILIP O [[id number]]   amoxicillin-clavulanate (AUGMENTIN) 875-125 MG tablet    Sig: Take 1 tablet by mouth 2 (two) times daily.    Dispense:  20 tablet    Refill:  0    Order Specific Question:   Supervising Provider    Answer:   Merrilee Jansky [id number]     *If you need refills on other medications prior to your next appointment, please contact your pharmacy*  Follow-Up: Call back or seek an in-person evaluation if the symptoms worsen or if the condition fails to improve as anticipated.  [facility] [phone]  Other Instructions Sinus Infection, Adult A sinus infection is soreness and swelling (inflammation) of your sinuses. Sinuses are hollow spaces in the bones  around your face. They are located: Around your eyes. In the middle of your forehead. Behind your nose. In your cheekbones. Your sinuses and nasal passages are lined with a fluid called mucus. Mucus drains out of your sinuses. Swelling can trap mucus in your sinuses. This lets germs (bacteria, virus, or fungus) grow, which leads to infection. Most of the time, this condition is caused by a virus. What are the causes? Allergies. Asthma. Germs. Things that block your nose or sinuses. Growths in the nose (nasal polyps). Chemicals or irritants in the air. A fungus. This is rare. What increases the risk? Having a weak body defense system (immune system). Doing a lot of swimming or diving. Using nasal sprays too much. Smoking. What are the signs or symptoms? The main symptoms of this condition are pain and a feeling of pressure around the sinuses. Other symptoms include: Stuffy nose (congestion). This may make it hard to breathe through your nose. Runny nose (drainage). Soreness, swelling, and warmth in the sinuses. A cough that may get worse at night. Being unable to smell and taste. Mucus that collects in the throat or the back of the nose (postnasal drip). This may cause a sore throat or bad breath. Being very tired (fatigued). A fever. How is this diagnosed? Your symptoms. Your medical history. A physical exam. Tests to find out if your condition is short-term (acute) or long-term (chronic). Your doctor may: Check your nose for growths (polyps). Check your sinuses using a tool that has a light on one end (endoscope). Check for allergies or germs. Do imaging tests, such as an MRI or CT scan. How is this treated? Treatment for this condition depends on the cause and whether it is short-term or long-term. If caused by a virus, your symptoms should go away on their own within 10 days. You may be given medicines to relieve symptoms. They include: Medicines that shrink swollen tissue  in the nose. A spray that treats swelling of the nostrils. Rinses that help get rid of thick mucus in your nose (nasal saline washes). Medicines that treat allergies (antihistamines). Over-the-counter pain relievers. If caused by bacteria, your doctor may wait to see if you will get better without treatment. You may be given antibiotic medicine if you have: A very bad infection. A weak body defense system. If caused by growths in the nose, surgery may be needed. Follow these instructions at home: Medicines Take, use, or apply over-the-counter and prescription medicines only as told by your doctor. These may include nasal sprays. If you were prescribed an antibiotic medicine, take it as told by your doctor. Do not stop taking it even if you start to feel better. Hydrate and humidify  Drink enough water to keep your  pee (urine) pale yellow. Use a cool mist humidifier to keep the humidity level in your home above 50%. Breathe in steam for 10-15 minutes, 3-4 times a day, or as told by your doctor. You can do this in the bathroom while a hot shower is running. Try not to spend time in cool or dry air. Rest Rest as much as you can. Sleep with your head raised (elevated). Make sure you get enough sleep each night. General instructions  Put a warm, moist washcloth on your face 3-4 times a day, or as often as told by your doctor. Use nasal saline washes as often as told by your doctor. Wash your hands often with soap and water. If you cannot use soap and water, use hand sanitizer. Do not smoke. Avoid being around people who are smoking (secondhand smoke). Keep all follow-up visits. Contact a doctor if: You have a fever. Your symptoms get worse. Your symptoms do not get better within 10 days. Get help right away if: You have a very bad headache. You cannot stop vomiting. You have very bad pain or swelling around your face or eyes. You have trouble seeing. You feel confused. Your neck is  stiff. You have trouble breathing. These symptoms may be an emergency. Get help right away. Call 911. Do not wait to see if the symptoms will go away. Do not drive yourself to the hospital. Summary A sinus infection is swelling of your sinuses. Sinuses are hollow spaces in the bones around your face. This condition is caused by tissues in your nose that become inflamed or swollen. This traps germs. These can lead to infection. If you were prescribed an antibiotic medicine, take it as told by your doctor. Do not stop taking it even if you start to feel better. Keep all follow-up visits. This information is not intended to replace advice given to you by your health care provider. Make sure you discuss any questions you have with your health care provider. Document Revised: 04/07/2021 Document Reviewed: 04/07/2021 Elsevier Patient Education  2023 Elsevier Inc.    If you have been instructed to have an in-person evaluation today at a local Urgent Care facility, please use the link below. It will take you to a list of all of our available [facility], including address, phone number and hours of operation. Please do not delay care.  [facility]  If you or a family member do not have a primary care provider, use the link below to schedule a visit and establish care. When you choose a [facility] primary care physician or advanced practice provider, you gain a long-term partner in health. Find a Primary Care Provider  Learn more about [facility]'s in-office and virtual care options:  - Get Care Now

## 2022-10-06 MED ORDER — CETIRIZINE HCL 10 MG PO TABS: 10.0000 mg | ORAL_TABLET | Freq: Every day | ORAL | 1 refills | Status: AC

## 2022-10-06 MED ORDER — FLUTICASONE PROPIONATE 50 MCG/ACT NA SUSP: 2.0000 | Freq: Every day | NASAL | 6 refills | Status: DC

## 2022-10-06 NOTE — Addendum Note (Signed)
Addended by: [name] on: 10/06/2022 06:28 PM   Modules accepted: Orders

## 2022-10-12 ENCOUNTER — Ambulatory Visit (INDEPENDENT_AMBULATORY_CARE_PROVIDER_SITE_OTHER): Payer: Medicare Other | Admitting: *Deleted

## 2022-10-12 DIAGNOSIS — E1159 Type 2 diabetes mellitus with other circulatory complications: Secondary | ICD-10-CM

## 2022-10-12 DIAGNOSIS — E1165 Type 2 diabetes mellitus with hyperglycemia: Secondary | ICD-10-CM

## 2022-10-12 NOTE — Chronic Care Management (AMB) (Signed)
Chronic Care Management   CCM RN Visit Note  10/12/2022 Name: Kelli Ruiz MRN: [id number] DOB: [birth year]  Subjective: Kelli Ruiz is a 67 y.o. year old female who is a primary care patient of [name], MD. The patient was referred to the Chronic Care Management team for assistance with care management needs subsequent to provider initiation of CCM services and plan of care.    Today's Visit:  Engaged with patient by telephone for follow up visit.        Goals Addressed             This Visit's Progress    CCM (DIABETES) EXPECTED OUTCOME: MONITOR, SELF- MANAGE AND REDUCE SYMPTOMS OF DIABETES       Current Barriers:  Knowledge Deficits related to Diabetes management Chronic Disease Management support and education needs related to Diabetes, diet Patient reports she does not consistently check CBG, states she only checks if symptomatic for hypo or hyperglycemia and has not checked recently Patient reports she follows Mediterranean diet, pt walks her dog daily for exercise  Planned Interventions: Reviewed medications with patient and discussed importance of medication adherence;        Counseled on importance of regular laboratory monitoring as prescribed;        Advised patient, providing education and rationale, to check cbg per doctor's order  and record        call provider for findings outside established parameters;       Review of patient status, including review of consultants reports, relevant laboratory and other test results, and medications completed;       Reinforced carbohydrate modified diet  Symptom Management: Take medications as prescribed   Attend all scheduled provider appointments Call pharmacy for medication refills 3-7 days in advance of running out of medications Attend church or other social activities Perform all self care activities independently  Perform IADL's (shopping, preparing meals, housekeeping, managing finances)  independently Call provider office for new concerns or questions  check blood sugar at prescribed times: per doctor's order  check feet daily for cuts, sores or redness enter blood sugar readings and medication or insulin into daily log take the blood sugar log to all doctor visits take the blood sugar meter to all doctor visits trim toenails straight across fill half of plate with vegetables limit fast food meals to no more than 1 per week manage portion size prepare main meal at home 3 to 5 days each week read food labels for fat, fiber, carbohydrates and portion size keep feet up while sitting Please check blood sugar consistently and keep a log Continue working with Child psychotherapist for resources/ assistance related to caregiver stress  Follow Up Plan: Telephone follow up appointment with care management team member scheduled for:  01/06/23 at 130 pm       CCM (HYPERTENSION) EXPECTED OUTCOME: MONITOR, SELF-MANAGE AND REDUCE SYMPTOMS OF HYPERTENSION       Current Barriers:  Knowledge Deficits related to Hypertension management Care Coordination needs related to caregiver stress/ working with Child psychotherapist  in a patient with Hypertension Chronic Disease Management support and education needs related to Hypertension, diet No Advanced Directives in place- patient reports she has documents and plans to complete Patient reports she lives with 2 sisters and 1 brother in [place] and 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  Patient reports she continues working with Child psychotherapist  for caregiver stress/ resources and reports " this has helped  tremendously"  pt reports she has appointment with therapist in June. Patient reports she is independent with all aspects of her care, pt has blood pressure cuff but does not monitor blood pressure  Planned Interventions Reinforced low sodium diet Reviewed medications and importance of taking as prescribed Reviewed importance of  continuing to exercise Reviewed importance of relaxation and daily self care Encouraged pt to monitor blood pressure  Symptom Management: Take medications as prescribed   Attend all scheduled provider appointments Call pharmacy for medication refills 3-7 days in advance of running out of medications Attend church or other social activities Perform all self care activities independently  Perform IADL's (shopping, preparing meals, housekeeping, managing finances) independently Call provider office for new concerns or questions  check blood pressure weekly choose a place to take my blood pressure (home, clinic or office, retail store) write blood pressure results in a log or diary learn about high blood pressure keep a blood pressure log take blood pressure log to all doctor appointments call doctor for signs and symptoms of high blood pressure keep all doctor appointments take medications for blood pressure exactly as prescribed report new symptoms to your doctor eat more whole grains, fruits and vegetables, lean meats and healthy fats Follow low sodium diet Limit fast food Practice relaxation and self care every day  Follow Up Plan: Telephone follow up appointment with care management team member scheduled for:   01/06/23 at 130 pm          Plan:Telephone follow up appointment with care management team member scheduled for:  01/06/23 at 130 pm  Irving Shows [facility], BSN RN Case Manager [facility] [place] [phone]

## 2022-10-12 NOTE — Patient Instructions (Signed)
Please call the care guide team at [phone] if you need to cancel or reschedule your appointment.   If you are experiencing a Mental Health or Behavioral Health Crisis or need someone to talk to, please call the Suicide and Crisis Lifeline: 988 call the [place] National Suicide Prevention Lifeline: 8145885716 or TTY: (418)133-2773 TTY (262)023-8312) to talk to a trained counselor call 1-800-273-TALK (toll free, 24 hour hotline) go to [facility] [address] [place] [phone]) call 911   Following is a copy of the CCM Program Consent:  CCM service includes personalized support from designated clinical staff supervised by the physician, including individualized plan of care and coordination with other care providers 24/7 contact phone numbers for assistance for urgent and routine care needs. Service will only be billed when office clinical staff spend 20 minutes or more in a month to coordinate care. Only one practitioner may furnish and bill the service in a calendar month. The patient may stop CCM services at amy time (effective at the end of the month) by phone call to the office staff. The patient will be responsible for cost sharing (co-pay) or up to 20% of the service fee (after annual deductible is met)  Following is a copy of your full provider care plan:   Goals Addressed             This Visit's Progress    CCM (DIABETES) EXPECTED OUTCOME: MONITOR, SELF- MANAGE AND REDUCE SYMPTOMS OF DIABETES       Current Barriers:  Knowledge Deficits related to Diabetes management Chronic Disease Management support and education needs related to Diabetes, diet Patient reports she does not consistently check CBG, states she only checks if symptomatic for hypo or hyperglycemia and has not checked recently Patient reports she follows Mediterranean diet, pt walks her dog daily for exercise  Planned Interventions: Reviewed medications with  patient and discussed importance of medication adherence;        Counseled on importance of regular laboratory monitoring as prescribed;        Advised patient, providing education and rationale, to check cbg per doctor's order  and record        call provider for findings outside established parameters;       Review of patient status, including review of consultants reports, relevant laboratory and other test results, and medications completed;       Reinforced carbohydrate modified diet  Symptom Management: Take medications as prescribed   Attend all scheduled provider appointments Call pharmacy for medication refills 3-7 days in advance of running out of medications Attend church or other social activities Perform all self care activities independently  Perform IADL's (shopping, preparing meals, housekeeping, managing finances) independently Call provider office for new concerns or questions  check blood sugar at prescribed times: per doctor's order  check feet daily for cuts, sores or redness enter blood sugar readings and medication or insulin into daily log take the blood sugar log to all doctor visits take the blood sugar meter to all doctor visits trim toenails straight across fill half of plate with vegetables limit fast food meals to no more than 1 per week manage portion size prepare main meal at home 3 to 5 days each week read food labels for fat, fiber, carbohydrates and portion size keep feet up while sitting Please check blood sugar consistently and keep a log Continue working with Child psychotherapist for resources/ assistance related to caregiver stress  Follow Up Plan: Telephone  follow up appointment with care management team member scheduled for:  01/06/23 at 130 pm       CCM (HYPERTENSION) EXPECTED OUTCOME: MONITOR, SELF-MANAGE AND REDUCE SYMPTOMS OF HYPERTENSION       Current Barriers:  Knowledge Deficits related to Hypertension management Care Coordination needs  related to caregiver stress/ working with Child psychotherapist  in a patient with Hypertension Chronic Disease Management support and education needs related to Hypertension, diet No Advanced Directives in place- patient reports she has documents and plans to complete Patient reports she lives with 2 sisters and 1 brother in [place] and 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  Patient reports she continues working with Child psychotherapist  for caregiver stress/ resources and reports " this has helped tremendously"  pt reports she has appointment with therapist in June. Patient reports she is independent with all aspects of her care, pt has blood pressure cuff but does not monitor blood pressure  Planned Interventions Reinforced low sodium diet Reviewed medications and importance of taking as prescribed Reviewed importance of continuing to exercise Reviewed importance of relaxation and daily self care Encouraged pt to monitor blood pressure  Symptom Management: Take medications as prescribed   Attend all scheduled provider appointments Call pharmacy for medication refills 3-7 days in advance of running out of medications Attend church or other social activities Perform all self care activities independently  Perform IADL's (shopping, preparing meals, housekeeping, managing finances) independently Call provider office for new concerns or questions  check blood pressure weekly choose a place to take my blood pressure (home, clinic or office, retail store) write blood pressure results in a log or diary learn about high blood pressure keep a blood pressure log take blood pressure log to all doctor appointments call doctor for signs and symptoms of high blood pressure keep all doctor appointments take medications for blood pressure exactly as prescribed report new symptoms to your doctor eat more whole grains, fruits and vegetables, lean meats and healthy fats Follow low sodium  diet Limit fast food Practice relaxation and self care every day  Follow Up Plan: Telephone follow up appointment with care management team member scheduled for:   01/06/23 at 130 pm          Patient verbalizes understanding of instructions and care plan provided today and agrees to view in MyChart. Active MyChart status and patient understanding of how to access instructions and care plan via MyChart confirmed with patient.  Telephone follow up appointment with care management team member scheduled for:   01/06/23 at 130 pm

## 2022-10-14 ENCOUNTER — Encounter: Payer: Medicare Other | Admitting: Licensed Clinical Social Worker

## 2022-10-15 DIAGNOSIS — E1159 Type 2 diabetes mellitus with other circulatory complications: Secondary | ICD-10-CM

## 2022-10-15 DIAGNOSIS — I10 Essential (primary) hypertension: Secondary | ICD-10-CM

## 2022-10-18 DIAGNOSIS — G4733 Obstructive sleep apnea (adult) (pediatric): Secondary | ICD-10-CM | POA: Diagnosis not present

## 2022-10-21 ENCOUNTER — Encounter: Payer: Self-pay | Admitting: Family Medicine

## 2022-10-21 ENCOUNTER — Ambulatory Visit (INDEPENDENT_AMBULATORY_CARE_PROVIDER_SITE_OTHER): Payer: Medicare Other | Admitting: Family Medicine

## 2022-10-21 VITALS — BP 130/76 | HR 77 | Temp 97.4°F | Wt 157.0 lb

## 2022-10-21 DIAGNOSIS — G4733 Obstructive sleep apnea (adult) (pediatric): Secondary | ICD-10-CM | POA: Diagnosis not present

## 2022-10-21 DIAGNOSIS — Z1231 Encounter for screening mammogram for malignant neoplasm of breast: Secondary | ICD-10-CM

## 2022-10-21 DIAGNOSIS — Z7984 Long term (current) use of oral hypoglycemic drugs: Secondary | ICD-10-CM

## 2022-10-21 DIAGNOSIS — Z1211 Encounter for screening for malignant neoplasm of colon: Secondary | ICD-10-CM

## 2022-10-21 DIAGNOSIS — R350 Frequency of micturition: Secondary | ICD-10-CM | POA: Insufficient documentation

## 2022-10-21 DIAGNOSIS — F3341 Major depressive disorder, recurrent, in partial remission: Secondary | ICD-10-CM | POA: Diagnosis not present

## 2022-10-21 DIAGNOSIS — M069 Rheumatoid arthritis, unspecified: Secondary | ICD-10-CM | POA: Diagnosis not present

## 2022-10-21 DIAGNOSIS — E119 Type 2 diabetes mellitus without complications: Secondary | ICD-10-CM

## 2022-10-21 DIAGNOSIS — D229 Melanocytic nevi, unspecified: Secondary | ICD-10-CM | POA: Diagnosis not present

## 2022-10-21 DIAGNOSIS — E785 Hyperlipidemia, unspecified: Secondary | ICD-10-CM | POA: Diagnosis not present

## 2022-10-21 DIAGNOSIS — I152 Hypertension secondary to endocrine disorders: Secondary | ICD-10-CM

## 2022-10-21 DIAGNOSIS — E1159 Type 2 diabetes mellitus with other circulatory complications: Secondary | ICD-10-CM

## 2022-10-21 DIAGNOSIS — E1169 Type 2 diabetes mellitus with other specified complication: Secondary | ICD-10-CM | POA: Diagnosis not present

## 2022-10-21 DIAGNOSIS — F329 Major depressive disorder, single episode, unspecified: Secondary | ICD-10-CM | POA: Insufficient documentation

## 2022-10-21 DIAGNOSIS — M542 Cervicalgia: Secondary | ICD-10-CM | POA: Diagnosis not present

## 2022-10-21 LAB — POCT GLYCOSYLATED HEMOGLOBIN (HGB A1C): Hemoglobin A1C: 7.3 % — AB (ref 4.0–5.6)

## 2022-10-21 MED ORDER — DICLOFENAC SODIUM 1 % EX GEL: 4.0000 g | Freq: Four times a day (QID) | CUTANEOUS | 3 refills | Status: AC | PRN

## 2022-10-21 MED ORDER — DAPAGLIFLOZIN PROPANEDIOL 5 MG PO TABS: 5.0000 mg | ORAL_TABLET | Freq: Every day | ORAL | 0 refills | Status: AC

## 2022-10-21 NOTE — Progress Notes (Signed)
Assessment/Plan:   Problem List Items Addressed This Visit       Cardiovascular and Mediastinum   Hypertension associated with diabetes (HCC)   Relevant Medications   dapagliflozin propanediol (FARXIGA) 5 MG TABS tablet     Respiratory   OSA on CPAP     Endocrine   Diabetes mellitus (HCC)    Patient's HbA1c has increased to 7.3. Stress-related eating habits are noted along with the side effect of urinary frequency from [place].  Plan:  Reduce Farxiga from 10 mg to 5 mg daily. Monitor blood glucose levels at home regularly. Encourage dietary changes and regular exercise. Follow-up HbA1c in 3 months. Urinalysis today to rule out UTI.      Relevant Medications   dapagliflozin propanediol (FARXIGA) 5 MG TABS tablet   Other Relevant Orders   POCT glycosylated hemoglobin (Hb A1C) (Completed)   TSH   Lipid panel   Hemoglobin A1c   CBC with Differential/Platelet   Comprehensive metabolic panel   Hyperlipidemia associated with type 2 diabetes mellitus (HCC)    On rosuvastatin 10 mg daily, no recent lipid panel results discussed.  Plan:  Continue rosuvastatin 10 mg daily. Schedule lipid panel at the next visit. Encourage dietary changes and physical activity.      Relevant Medications   dapagliflozin propanediol (FARXIGA) 5 MG TABS tablet     Musculoskeletal and Integument   Rheumatoid arthritis involving multiple sites [facility])    Patient manages hand pain with diclofenac gel, experiences neck pain attributed to C2 arthritis.  Plan:  Continue diclofenac gel as needed. Prescribe Voltaren gel for neck pain. Recommend range of motion exercises and physical activity. Reassess symptoms at the next visit.        Other   MDD (major depressive disorder) - Primary    Patient continues to experience mild depression with a PHQ-9 score of 4. Emotional distress is exacerbated by caregiving responsibilities.  Plan:  Continue citalopram 40 mg daily. Begin therapy sessions  next week. Reassess PHQ-9 score at the next visit. Encourage patient to engage in self-care activities and utilize support systems.      Urinary frequency   Relevant Orders   Urinalysis w microscopic + reflex cultur   Multiple nevi    Referral dermatology for routine skin exam      Relevant Orders   Ambulatory referral to Dermatology   Other Visit Diagnoses     Neck pain       Relevant Medications   diclofenac Sodium (VOLTAREN) 1 % GEL   Colon cancer screening       Relevant Orders   Cologuard   Screening mammogram for breast cancer       Relevant Orders   MM DIGITAL SCREENING BILATERAL       Medications Discontinued During This Encounter  Medication Reason   ibuprofen (ADVIL) 200 MG tablet    Turmeric 500 MG CAPS    fluticasone (FLONASE) 50 MCG/ACT nasal spray    amoxicillin-clavulanate (AUGMENTIN) 875-125 MG tablet    Insulin Pen Needle 32G X 4 MM MISC    dapagliflozin propanediol (FARXIGA) 10 MG TABS tablet Reorder    Return in about 3 months (around 01/21/2023) for BP, DM, HLD, fasting labs 1 week before appointment.    Subjective:   Encounter date: 10/21/2022  Kelli Ruiz is a 67 y.o. female who has S/P conversion right knee; Diabetes mellitus (HCC); Hypertension associated with diabetes (HCC); Hyperlipidemia associated with type 2 diabetes mellitus (HCC); Tremor; OSA (obstructive sleep  apnea); Swelling of both hands; Excessive daytime sleepiness; OSA on CPAP; Non-restorative sleep; RLS (restless legs syndrome); Bilateral lower extremity edema; Vertigo; Caregiver stress; Ocular myokymia; Rheumatoid arthritis involving multiple sites  [facility]); MDD (major depressive disorder); Urinary frequency; and Multiple nevi on their problem list..   She  has a past medical history of Arthritis, Complication of anesthesia, Depression, Diabetes mellitus without complication (HCC), Hypertension, Neuromuscular disorder (HCC), Obese (01/19/2017), RLS (restless legs syndrome), Sleep  apnea, and Vertigo.Marland Kitchen   CHIEF COMPLAINT: Patient presents for follow-up on depression, diabetes management, rheumatoid arthritis, and general health.  HISTORY OF PRESENT ILLNESS:  Depression. Patient reports feeling generally okay but struggling with caregiving responsibilities for her sister, contributing to occasional emotional distress. Currently on citalopram 40 mg, which she is taking regularly and effectively. PHQ-9 score is 4, indicating mild depression. Patient is set to see a therapist next week.  Diabetes. Patient's HbA1c is 7.3, slightly higher than the previous 6.7. She attributes this to stress and emotional eating behaviors. Currently on Farxiga 10 mg daily, considering reducing to 5 mg due to urinary frequency side effects. Rarely monitors blood sugar at home due to "lack of time."  Rheumatoid Arthritis. Patient does not take hydroxychloroquine due to concerns over steroid usage and the need for frequent blood work. Prefers to manage pain with diclofenac gel. Adapted to using aids for hand-related tasks. Reports occasional neck pain, which she manages with stretching exercises.  The patient has not yet tried Voltaren gel but is open to it for symptom relief.  Urinary Frequency. Patient experiences increased urinary frequency, likely attributed to [place]. No associated symptoms such as fever or back pain.   Chronic Nasal Drainage. Patient uses CPAP with a humidifier and cleans the unit regularly. Experiences nasal drainage in the morning, uses Flonase with variable success.  Hyperlipidemia. On rosuvastatin 10 mg daily with no recent lipid panel results discussed this visit.  Preventive Care. Patient is due for mammogram and colonoscopy, would prefer Cologuard for colon cancer screening. Declined tetanus and shingles vaccinations for this visit.  Review of Systems  Constitutional:  Positive for malaise/fatigue. Negative for chills, diaphoresis, fever and weight loss.  HENT:   Positive for congestion. Negative for ear discharge, ear pain and hearing loss.   Eyes:  Negative for blurred vision, double vision, photophobia, pain, discharge and redness.  Respiratory:  Negative for cough, sputum production, shortness of breath and wheezing.   Cardiovascular:  Negative for chest pain and palpitations.  Gastrointestinal:  Negative for abdominal pain, blood in stool, constipation, diarrhea, heartburn, melena, nausea and vomiting.  Genitourinary:  Positive for frequency. Negative for dysuria, flank pain, hematuria and urgency.  Musculoskeletal:  Positive for back pain and joint pain. Negative for myalgias.  Skin:  Negative for itching and rash.       Multiple moles  Psychiatric/Behavioral:  Positive for depression. Negative for hallucinations, memory loss, substance abuse and suicidal ideas. The patient is nervous/anxious. The patient does not have insomnia.   All other systems reviewed and are negative.   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 90 tablet 1   blood glucose meter kit and supplies Dispense based on patient and insurance preference. Use up to four times daily as directed. (FOR ICD-10 E10.9, E11.9). 1 each 0   cetirizine (ALLERGY, CETIRIZINE,) 10 MG tablet Take 1 tablet (10 mg total) by mouth daily. 90 tablet 1   citalopram (CELEXA) 40 MG tablet Take 1 tablet (40 mg total) by mouth daily. 90 tablet 1   fluticasone  (FLONASE) 50 MCG/ACT nasal spray Place 2 sprays into both nostrils daily as needed for allergies or rhinitis. 16 g 3   gabapentin (NEURONTIN) 600 MG tablet Take 1 tablet (600 mg total) by mouth 2 (two) times daily. 180 tablet 1   glucose blood test strip Use as instructed 100 each 12   Lancets MISC 1 each by Does not apply route in the morning, at noon, in the evening, and at bedtime. 100 each 12   meclizine (ANTIVERT) 12.5 MG tablet Take 1 tablet (12.5 mg total) by mouth 3 (three) times daily as needed for dizziness. 30 tablet 0   rosuvastatin (CRESTOR) 10 MG tablet Take 1 tablet (10 mg total) by mouth at bedtime. 90 tablet 3   dapagliflozin propanediol (FARXIGA) 10 MG TABS tablet Take 1 tablet (10 mg total) by mouth daily before breakfast. Patient receives via [name] Patient Assistance 90 tablet 3   Insulin Pen Needle 32G X 4 MM MISC Use with insulin once daily  DX R73.03 and Z79.4     amoxicillin-clavulanate (AUGMENTIN) 875-125 MG tablet Take 1 tablet by mouth 2 (two) times daily. (Patient not taking: Reported on 10/12/2022) 20 tablet 0   fluticasone (FLONASE) 50 MCG/ACT nasal spray Place 2 sprays into both nostrils daily. (Patient not taking: Reported on 10/21/2022) 16 g 6   ibuprofen (ADVIL) 200 MG tablet Take 200 mg by mouth every 6 (six) hours as needed for mild pain or headache. (Patient not taking: Reported on 10/21/2022)     Turmeric 500 MG CAPS Take 500 mg by mouth daily. (Patient not taking: Reported on 10/21/2022)     No facility-administered medications prior to visit.    Family History  Problem Relation Age of Onset   Diabetes Mother    Heart attack Mother    Kidney disease Mother    Arthritis Father    Stroke Father    Hypertension Father    Cancer Sister    Dementia Sister    Schizophrenia Sister     Social History   Socioeconomic History   Marital status: Single    Spouse name: Not on file   Number of children: Not on file   Years of education: Not on file   Highest  education level: Not on file  Occupational History   Not on file  Tobacco Use   Smoking status: Former    Years: 20    Types: Cigarettes    Quit date: 01/09/2017    Years since quitting: 5.7   Smokeless tobacco: Never   Tobacco comments:    Pt uses vapor  Vaping Use   Vaping Use: Some days  Substance and Sexual Activity   Alcohol use: No   Drug use: No   Sexual activity: Not on file  Other Topics Concern   Not on file  Social History Narrative   Not on file   Social Determinants of  Health   Financial Resource Strain: Low Risk  (07/30/2022)   Overall Financial Resource Strain (CARDIA)    Difficulty of Paying Living Expenses: Not hard at all  Food Insecurity: No Food Insecurity (07/30/2022)   Hunger Vital Sign    Worried About Running Out of Food in the Last Year: Never true    Ran Out of Food in the Last Year: Never true  Transportation Needs: No Transportation Needs (07/30/2022)   PRAPARE - Administrator, Civil Service (Medical): No    Lack of Transportation (Non-Medical): No  Physical Activity: Sufficiently Active (07/30/2022)   Exercise Vital Sign    Days of Exercise per Week: 7 days    Minutes of Exercise per Session: 30 min  Stress: Stress Concern Present (07/30/2022)   [employer] of Occupational Health - Occupational Stress Questionnaire    Feeling of Stress : To some extent  Social Connections: Socially Isolated (07/30/2022)   Social Connection and Isolation Panel [NHANES]    Frequency of Communication with Friends and Family: More than three times a week    Frequency of Social Gatherings with Friends and Family: More than three times a week    Attends Religious Services: Never    Database administrator or Organizations: No    Attends Banker Meetings: Never    Marital Status: Never married  Intimate Partner Violence: Not At Risk (07/30/2022)   Humiliation, Afraid, Rape, and Kick questionnaire    Fear of Current or Ex-Partner: No     Emotionally Abused: No    Physically Abused: No    Sexually Abused: No                                                                                                  Objective:  Physical Exam: BP 130/76 (BP Location: Left Arm, Patient Position: Sitting, Cuff Size: Large)   Pulse 77   Temp (!) 97.4 F (36.3 C) (Temporal)   Wt 157 lb (71.2 kg)   SpO2 97%   BMI 26.95 kg/m     Physical Exam Constitutional:      General: She is not in acute distress.    Appearance: Normal appearance. She is not ill-appearing or toxic-appearing.  HENT:     Head: Normocephalic and atraumatic.     Nose: Nose normal. No congestion.  Eyes:     General: No scleral icterus.    Extraocular Movements: Extraocular movements intact.  Cardiovascular:     Rate and Rhythm: Normal rate and regular rhythm.     Pulses: Normal pulses.     Heart sounds: Normal heart sounds.  Pulmonary:     Effort: Pulmonary effort is normal. No respiratory distress.     Breath sounds: Normal breath sounds.  Abdominal:     General: Abdomen is flat. Bowel sounds are normal.     Palpations: Abdomen is soft.  Musculoskeletal:        General: Normal range of motion.     Right lower leg: No edema.     Left lower leg: No edema.  Lymphadenopathy:  Cervical: No cervical adenopathy.  Skin:    General: Skin is warm and dry.     Findings: No rash.     Comments: Multiple nevi on upper extremities and 1 large one on the left lateral neck  Neurological:     General: No focal deficit present.     Mental Status: She is alert and oriented to person, place, and time. Mental status is at baseline.  Psychiatric:        Mood and Affect: Mood normal.        Behavior: Behavior normal.        Thought Content: Thought content normal.        Judgment: Judgment normal.     No results found.  Recent Results (from the past 2160 hour(s))  HM DIABETES EYE EXAM     Status: None   Collection Time: 08/05/22 12:33 PM  Result Value Ref  Range   HM Diabetic Eye Exam No Retinopathy No Retinopathy    Comment: Abstracted by HIM  POCT glycosylated hemoglobin (Hb A1C)     Status: Abnormal   Collection Time: 10/21/22 10:34 AM  Result Value Ref Range   Hemoglobin A1C 7.3 (A) 4.0 - 5.6 %   HbA1c POC (<> result, manual entry)     HbA1c, POC (prediabetic range)     HbA1c, POC (controlled diabetic range)          Garner Nash, MD, MS

## 2022-10-21 NOTE — Assessment & Plan Note (Signed)
Patient manages hand pain with diclofenac gel, experiences neck pain attributed to C2 arthritis.  Plan:  Continue diclofenac gel as needed. Prescribe Voltaren gel for neck pain. Recommend range of motion exercises and physical activity. Reassess symptoms at the next visit.

## 2022-10-21 NOTE — Assessment & Plan Note (Signed)
Patient's HbA1c has increased to 7.3. Stress-related eating habits are noted along with the side effect of urinary frequency from [place].  Plan:  Reduce Farxiga from 10 mg to 5 mg daily. Monitor blood glucose levels at home regularly. Encourage dietary changes and regular exercise. Follow-up HbA1c in 3 months. Urinalysis today to rule out UTI.

## 2022-10-21 NOTE — Assessment & Plan Note (Signed)
Patient continues to experience mild depression with a PHQ-9 score of 4. Emotional distress is exacerbated by caregiving responsibilities.  Plan:  Continue citalopram 40 mg daily. Begin therapy sessions next week. Reassess PHQ-9 score at the next visit. Encourage patient to engage in self-care activities and utilize support systems.

## 2022-10-21 NOTE — Patient Instructions (Signed)
Continue taking citalopram 40 mg daily and monitor for any changes in mood or depression symptoms. Reduce Farxiga dosage from 10 mg to 5 mg daily to help manage urinary symptoms and monitor blood sugar levels at home. Use a humidifier regularly with your CPAP machine and ensure it is cleaned regularly to reduce nasal congestion. Apply Voltaren gel to the neck and hands as needed for arthritis pain. Follow up with counseling next week and report any changes in symptoms during the next appointment.

## 2022-10-21 NOTE — Assessment & Plan Note (Signed)
On rosuvastatin 10 mg daily, no recent lipid panel results discussed.  Plan:  Continue rosuvastatin 10 mg daily. Schedule lipid panel at the next visit. Encourage dietary changes and physical activity.

## 2022-10-21 NOTE — Assessment & Plan Note (Signed)
Referral dermatology for routine skin exam

## 2022-10-22 LAB — URINALYSIS W MICROSCOPIC + REFLEX CULTURE
Bacteria, UA: NONE SEEN /HPF
Bilirubin Urine: NEGATIVE
Hgb urine dipstick: NEGATIVE
Hyaline Cast: NONE SEEN /LPF
Ketones, ur: NEGATIVE
Leukocyte Esterase: NEGATIVE
Nitrites, Initial: NEGATIVE
Protein, ur: NEGATIVE
RBC / HPF: NONE SEEN /HPF (ref 0–2)
Specific Gravity, Urine: 1.041 — ABNORMAL HIGH (ref 1.001–1.035)
pH: 5.5 (ref 5.0–8.0)

## 2022-10-22 LAB — NO CULTURE INDICATED

## 2022-10-26 ENCOUNTER — Ambulatory Visit: Payer: Self-pay | Admitting: Licensed Clinical Social Worker

## 2022-10-26 ENCOUNTER — Telehealth: Payer: Self-pay

## 2022-10-26 NOTE — Patient Outreach (Signed)
  Care Coordination  Follow Up Visit Note   10/26/2022 Name: Kelli Ruiz MRN: [id number] DOB: 06-Jul-1955  Kelli Ruiz is a 67 y.o. year old female who sees [name], MD for primary care. I spoke with  Kelli Ruiz by phone today.  What matters to the patients health and wellness today?  Managing stressors  Patient continues to experience stress at home, however is making progress with implementing self care and has connected for ongoing therapy.  Initial appointment scheduled in July.   Goals Addressed             This Visit's Progress    Reduce Stress related to family member       Activities and task to complete in order to accomplish goals.   Continue with compliance of taking medication prescribed by Doctor Self Support options  (looking into [employer], connect with old friends and dog walking group) Review your EMMI educational information (Movement: Emotional Health and Relieving Stress) Look for an e-mail from [facility]. Keep appointment with [place] July 10th at [facility]        SDOH assessments and interventions completed:  No   Care Coordination Interventions:  Yes, provided  Interventions Today    Flowsheet Row Most Recent Value  Chronic Disease   Chronic disease during today's visit Hypertension (HTN), Diabetes  Mental Health Interventions   Mental Health Discussed/Reviewed Mental Health Reviewed, Coping Strategies       Follow up plan: Follow up call scheduled for 30 days    Encounter Outcome:  Pt. Visit Completed   Sammuel Hines, LCSW Social Work Care Coordination  Kelli Ruiz [phone]

## 2022-10-26 NOTE — Patient Instructions (Signed)
Social Work Visit Information  Thank you for taking time to visit with me today. Please don't hesitate to contact me if I can be of assistance to you.   Following are the goals we discussed today:   Goals Addressed             This Visit's Progress    Reduce Stress related to family member       Activities and task to complete in order to accomplish goals.   Continue with compliance of taking medication prescribed by Doctor Self Support options  (looking into [employer], connect with old friends and dog walking group) Review your EMMI educational information (Movement: Emotional Health and Relieving Stress) Look for an e-mail from [facility]. Keep appointment with [place] July 10th at [facility]        Our next appointment is by telephone on 11/26/22 at 9:15   Please call the care guide team at [phone] if you need to cancel or reschedule your appointment.   If you or anyone you know are experiencing a Mental Health or Behavioral Health Crisis or need someone to talk to, please call the Suicide and Crisis Lifeline: 988 call the [place] National Suicide Prevention Lifeline: (727)089-9027 or TTY: 938-661-5449 TTY (252)427-3636) to talk to a trained counselor call 1-800-273-TALK (toll free, 24 hour hotline) go to [facility] [address], [place] [phone])   Patient verbalizes understanding of instructions and care plan provided today and agrees to view in MyChart. Active MyChart status and patient understanding of how to access instructions and care plan via MyChart confirmed with patient.       Sammuel Hines, LCSW Social Work Care Coordination  [facility] [employer] [phone]

## 2022-10-26 NOTE — Progress Notes (Cosign Needed)
Care Management & Coordination Services Pharmacy Team  Reason for Encounter: Medication coordination and delivery  Contacted patient to discuss medications and coordinate delivery from [name].  Spoke with patient on 10/26/2022   Cycle dispensing form sent to [name], [facility] for review.   Last adherence delivery date:10/07/2022      Patient is due for next adherence delivery on: 11/08/2022  This delivery to include: Adherence Packaging  30 Days  Gabapentin 600 mg 1 tablet two time daily - Breakfast, Bedtime Amlodipine 5 mg 1 tablet daily - Breakfast Citalopram 40 mg 1 tablet daily - Breakfast Rosuvastatin 10 mg 1 tablet daily - Bedtime Cetirizine 10 mg 1 tablet daily- Breakfast Flonase 50 MCG/ACT  Place 2 sprays into both nostrils daily as needed   Patient declined the following medications this month: Farxiga 5 mg - receives patient assistance from [name]  Meclizine 12.5 mg 1 tablet daily PRN Accu-Check Lancets Misc Accu-Check Glucose test strips  Refills requested from providers include: Rosuvastatin prescribe by PCP, Citalopram - [name] sent request to specialist.  Confirmed delivery date of 11/08/2022 (First route), advised patient that pharmacy will contact them the morning of delivery.   Any concerns about your medications? No  How often do you forget or accidentally miss a dose? Never  Do you use a pillbox? No  Is patient in packaging Yes    Star Rating drugs: Farxiga 5 mg Patient receives via [name] Patient Assistance  Rosuvastatin 10 mg last filled 10/05/2022 30 Day supply at [facility].   Care Gaps: Hepatitis C Screening  Chart review: Recent office visits:  10/26/2022 Sammuel Hines LCSW (PCP Office) No Medication changes noted 10/21/2022 Dr. Janee Morn MD (PCP) Reduce Marcelline Deist from 10 mg to 5 mg daily, bdiclofenac Sodium (VOLTAREN) 1 % GEL PRN,  Ambulatory referral to Dermatology, Return in about 3 months    10/12/2022 Irving Shows RN (PCP Office) No Medication changes noted 09/29/2022 Joycelyn Man PA-C (PCP Office) start AUGMENTIN 875-125 MG tablet  Recent consult visits:  None ID  Hospital visits:  None in previous 6 months  Medications: Outpatient Encounter Medications as of 10/26/2022  Medication Sig   amLODipine (NORVASC) 5 MG tablet Take 1 tablet (5 mg total) by mouth daily.   blood glucose meter kit and supplies Dispense based on patient and insurance preference. Use up to four times daily as directed. (FOR ICD-10 E10.9, E11.9).   cetirizine (ALLERGY, CETIRIZINE,) 10 MG tablet Take 1 tablet (10 mg total) by mouth daily.   citalopram (CELEXA) 40 MG tablet Take 1 tablet (40 mg total) by mouth daily.   dapagliflozin propanediol (FARXIGA) 5 MG TABS tablet Take 1 tablet (5 mg total) by mouth daily. Patient receives via [name] Patient Assistance   diclofenac Sodium (VOLTAREN) 1 % GEL Apply 4 g topically 4 (four) times daily as needed.   fluticasone (FLONASE) 50 MCG/ACT nasal spray Place 2 sprays into both nostrils daily as needed for allergies or rhinitis.   gabapentin (NEURONTIN) 600 MG tablet Take 1 tablet (600 mg total) by mouth 2 (two) times daily.   glucose blood test strip Use as instructed   Lancets MISC 1 each by Does not apply route in the morning, at noon, in the evening, and at bedtime.   meclizine (ANTIVERT) 12.5 MG tablet Take 1 tablet (12.5 mg total) by mouth 3 (three) times daily as needed for dizziness.   rosuvastatin (CRESTOR) 10 MG tablet Take 1 tablet (10 mg total) by mouth at bedtime.  No facility-administered encounter medications on file as of 10/26/2022.   BP Readings from Last 3 Encounters:  10/21/22 130/76  08/17/22 120/64  07/19/22 122/84    Pulse Readings from Last 3 Encounters:  10/21/22 77  08/17/22 71  07/19/22 77    Lab Results  Component Value Date/Time   HGBA1C 7.3 (A) 10/21/2022 10:34 AM   HGBA1C 6.7 (A) 07/19/2022 10:25 AM   HGBA1C 7.9 (H) 11/30/2021 11:30  AM   HGBA1C 6.7 (H) 11/29/2016 10:57 AM   Lab Results  Component Value Date   CREATININE 0.92 06/25/2022   BUN 15 06/25/2022   GFRNONAA >60 06/25/2022   GFRAA >60 01/19/2017   NA 139 06/25/2022   K 4.1 06/25/2022   CALCIUM 9.7 06/25/2022   CO2 26 06/25/2022     Bessie Kellihan,CPA Clinical Pharmacist Assistant [phone]

## 2022-10-29 ENCOUNTER — Other Ambulatory Visit: Payer: Self-pay | Admitting: Family Medicine

## 2022-10-29 DIAGNOSIS — E1169 Type 2 diabetes mellitus with other specified complication: Secondary | ICD-10-CM

## 2022-11-01 ENCOUNTER — Ambulatory Visit
Admission: RE | Admit: 2022-11-01 | Discharge: 2022-11-01 | Disposition: A | Discharge status: Home / Self Care | Payer: Medicare Other | Source: Ambulatory Visit | Attending: Family Medicine | Admitting: Family Medicine

## 2022-11-01 DIAGNOSIS — Z1231 Encounter for screening mammogram for malignant neoplasm of breast: Secondary | ICD-10-CM

## 2022-11-10 ENCOUNTER — Telehealth: Payer: Self-pay | Admitting: Family Medicine

## 2022-11-10 MED ORDER — CITALOPRAM HYDROBROMIDE 40 MG PO TABS: 40.0000 mg | ORAL_TABLET | Freq: Every day | ORAL | 1 refills | Status: DC

## 2022-11-10 NOTE — Telephone Encounter (Signed)
Pt would like for you to give her a call pertaining to her medications

## 2022-11-10 NOTE — Telephone Encounter (Signed)
Pt stated that she needed a refill on [facility]ram [facility] to [facility]. Rx refill sent in today.    She also stated that the dermatology office we referred her to is booked until October due to having only one provider. She wanted to know if there were any other offices we could refer to? Please advise.

## 2022-11-16 NOTE — Telephone Encounter (Signed)
Sent to [facility]

## 2022-11-16 NOTE — Telephone Encounter (Signed)
Contacted patient and advised her of annotation below and updated location. She verbalized understanding.

## 2022-11-17 DIAGNOSIS — G4733 Obstructive sleep apnea (adult) (pediatric): Secondary | ICD-10-CM | POA: Diagnosis not present

## 2022-11-24 ENCOUNTER — Ambulatory Visit (INDEPENDENT_AMBULATORY_CARE_PROVIDER_SITE_OTHER): Payer: Medicare Other | Admitting: Behavioral Health

## 2022-11-24 DIAGNOSIS — Z636 Dependent relative needing care at home: Secondary | ICD-10-CM | POA: Diagnosis not present

## 2022-11-24 DIAGNOSIS — F3341 Major depressive disorder, recurrent, in partial remission: Secondary | ICD-10-CM | POA: Diagnosis not present

## 2022-11-24 NOTE — Progress Notes (Signed)
[facility] Counselor Initial Adult Exam  Name: Kelli Ruiz Date: 11/24/2022 MRN: [id number] DOB: 1955/10/08 PCP: [name], MD  Time spent: 60 min In Person @ [facility] Office Time In: 10:00am Time Out: 11:00am  Guardian/Payee:  [name] Medicare    Paperwork requested: No   Reason for Visit /Presenting Problem: Elevated anx/dep due to Caregiving in the home  Mental Status Exam: Appearance:   Casual     Behavior:  Appropriate and Sharing  Motor:  Normal  Speech/Language:   Clear and Coherent  Affect:  Appropriate  Mood:  normal  Thought process:  normal  Thought content:    WNL  Sensory/Perceptual disturbances:    WNL  Orientation:  oriented to person, place, time/date, and situation  Attention:  Good  Concentration:  Good  Memory:  WNL  Fund of knowledge:   Good  Insight:    Good  Judgment:   Good  Impulse Control:  Good    Risk Assessment: Danger to Self:  No Self-injurious Behavior: No Danger to Others: No Duty to Warn:no Physical Aggression / Violence:No  Access to Firearms a concern: No  Gang Involvement:No  Patient / guardian was educated about steps to take if suicide or homicide risk level increases between visits: yes; appropriate to ICD process While future psychiatric events cannot be accurately predicted, the patient does not currently require acute inpatient psychiatric care and does not currently meet [facility] involuntary commitment criteria.  Substance Abuse History: Current substance abuse: No     Past Psychiatric History:   No previous psychological problems have been observed Outpatient Providers: Dr. Fanny Bien, MD History of Psych Hospitalization: No  Psychological Testing:  NA    Abuse History:  Victim of: No.,  NA    Report needed: No. Victim of Neglect:No. Perpetrator of  NA   Witness / Exposure to Domestic Violence: No   Protective Services Involvement: No  Witness to [employer] Violence:  No    Family History:  Family History  Problem Relation Age of Onset   Diabetes Mother    Heart attack Mother    Kidney disease Mother    Arthritis Father    Stroke Father    Hypertension Father    Cancer Sister    Dementia Sister    Schizophrenia Sister    Breast cancer Neg Hx     Living situation: the patient lives with their family; Younger Str Tonya & her Husb [employer] & Str [employer]  Sexual Orientation: Straight  Relationship Status: single  Name of spouse / other: NA If a parent, number of children / ages: NA  Support Systems: friends Family Widow Renae Fickle) of her Best Friend from 10th Grade [employer])  Financial Stress:  No   Income/Employment/Disability: Neurosurgeon: No   Educational History: Education: college graduate  Religion/Sprituality/World View: Ephriam Knuckles belief syst  Any cultural differences that may affect / interfere with treatment:  NA  Recreation/Hobbies: music &  Pt is trying to get Px'ly active again after total knee replacement in 2020-2021(?)  Stressors: Health problems   Loss of stable Family Unit since [employer] has been Dx'd w/Dementia w/psychotic features (probable Dx of Str w/Schizophrenia & Bipolar d/o)   Marital or family conflict   Other: Pt is main Caregiver for her Str Juliette Alcide who is currently taking no medications & is actively hallucinating per Pt report today    Strengths: Supportive Relationships, Family, Friends, Spirituality, Hopefulness, Self Advocate, Able to  Communicate Effectively, and strong character  Barriers:  None noted today   Legal History: Pending legal issue / charges: The patient has no significant history of legal issues. History of legal issue / charges:  NA  Medical History/Surgical History: reviewed Past Medical History:  Diagnosis Date   Arthritis    Complication of anesthesia    " they had a little bit of a hard time waking me after they took a nerve out of my leg"     Depression    Diabetes mellitus without complication (HCC)    Hypertension    Neuromuscular disorder (HCC)    Obese 01/19/2017   RLS (restless legs syndrome)    Sleep apnea    Vertigo     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;  Surgeon: Durene Romans, MD;  Location: [facility];  Service: Orthopedics;  Laterality: Right;  90 mins with regional block   DILATION AND CURETTAGE OF UTERUS  12- 2014 or 2015   TONSILLECTOMY     unicompartmental knee  2016    Medications: Current Outpatient Medications  Medication Sig Dispense Refill   amLODipine (NORVASC) 5 MG tablet Take 1 tablet (5 mg total) by mouth daily. 90 tablet 1   blood glucose meter kit and supplies Dispense based on patient and insurance preference. Use up to four times daily as directed. (FOR ICD-10 E10.9, E11.9). 1 each 0   cetirizine (ALLERGY, CETIRIZINE,) 10 MG tablet Take 1 tablet (10 mg total) by mouth daily. 90 tablet 1   citalopram (CELEXA) 40 MG tablet Take 1 tablet (40 mg total) by mouth daily. 90 tablet 1   dapagliflozin propanediol (FARXIGA) 5 MG TABS tablet Take 1 tablet (5 mg total) by mouth daily. Patient receives via [name] Patient Assistance 90 tablet 0   diclofenac Sodium (VOLTAREN) 1 % GEL Apply 4 g topically 4 (four) times daily as needed. 100 g 3   fluticasone (FLONASE) 50 MCG/ACT nasal spray Place 2 sprays into both nostrils daily as needed for allergies or rhinitis. 16 g 3   gabapentin (NEURONTIN) 600 MG tablet Take 1 tablet (600 mg total) by mouth 2 (two) times daily. 180 tablet 1   glucose blood test strip Use as instructed 100 each 12   Lancets MISC 1 each by Does not apply route in the morning, at noon, in the evening, and at bedtime. 100 each 12   meclizine (ANTIVERT) 12.5 MG tablet Take 1 tablet (12.5 mg total) by mouth 3 (three)  times daily as needed for dizziness. 30 tablet 0   rosuvastatin (CRESTOR) 10 MG tablet TAKE ONE TABLET BY MOUTH EVERYDAY AT BEDTIME 90 tablet 2   No current facility-administered medications for this visit.    Allergies  Allergen Reactions   Bee Pollen     Diagnoses:  Recurrent major depressive disorder, in partial remission [facility])  Caregiver stress  Plan of Care: Jaline will attend all sessions as scheduled every 3-4 wks. She will implement suggestions/tips/skills from ea session & report back her progress. Today, Pt has agreed to determine which Provider her Str Juliette Alcide saw for medication that she truly related to so we can promote another good fit for her w/someone new. Make further efforts towards the [facility] & [employer].   Target Date: 12/30/2022  Progress: 5  Frequency: Once every 3-4 wks  Modality:  Rudean Hitt We will consider Family Th in the future.   Deneise Lever, LMFT

## 2022-11-24 NOTE — Progress Notes (Signed)
                Brannan Cassedy L Zerenity Bowron, LMFT 

## 2022-11-25 ENCOUNTER — Ambulatory Visit: Payer: Self-pay | Admitting: Licensed Clinical Social Worker

## 2022-11-25 NOTE — Patient Instructions (Signed)
Social Work Visit Information  Thank you for taking time to visit with me today. Please don't hesitate to contact me if I can be of assistance to you.   Following are the goals we discussed today:   Goals Addressed             This Visit's Progress    COMPLETED: Reduce Stress and continue with therapy       Activities and task to complete in order to accomplish goals.   Continue with compliance of taking medication prescribed by Doctor Self Support options  (looking into [employer], connect with old friends and dog walking group) Keep appointments with [place] at [employer]. I am so glad that you will continue seeing her to assist with managing your mental health         Patient may benefit from and is in agreement for social work to remain part of care team for the next 60 days.  If no needs are identified in the next 60 days, social work will disconnect from the care team.  Please call the care guide team at [phone] if you need to cancel or reschedule your appointment.   If you or anyone you know are experiencing a Mental Health or Behavioral Health Crisis or need someone to talk to, please call the Suicide and Crisis Lifeline: 988 call the [place] National Suicide Prevention Lifeline: (212)129-7276 or TTY: 743-717-6396 TTY 661-492-1424) to talk to a trained counselor call 1-800-273-TALK (toll free, 24 hour hotline) go to [facility] [address] [place] [phone])   Patient verbalizes understanding of instructions and care plan provided today and agrees to view in MyChart. Active MyChart status and patient understanding of how to access instructions and care plan via MyChart confirmed with patient.       Sammuel Hines, LCSW Social Work Care Coordination  [facility] Emmie Niemann [employer] [phone]

## 2022-11-25 NOTE — Patient Outreach (Signed)
  Care Coordination  Follow Up Visit Note   11/25/2022 Name: KEYLAH DARWISH MRN: [id number] DOB: 1955/08/21  Dianna Limbo Obryant is a 67 y.o. year old female who sees [name], MD for primary care. I spoke with  Maddyson A Riedesel by phone today.  What matters to the patients health and wellness today?  Decreasing stress and managing mental health Patient is making progress with her goals.  She has connected with her therapist and looking forward to ongoing therapy.   Goals Addressed             This Visit's Progress    COMPLETED: Reduce Stress and continue with therapy       Activities and task to complete in order to accomplish goals.   Continue with compliance of taking medication prescribed by Doctor Self Support options  (looking into [employer], connect with old friends and dog walking group) Keep appointments with [place] at [employer]. I am so glad that you will continue seeing her to assist with managing your mental health        SDOH assessments and interventions completed:  Yes  SDOH Interventions Today    Flowsheet Row Most Recent Value  SDOH Interventions   Stress Interventions Provide Counseling       Care Coordination Interventions:  Yes, provided  Interventions Today    Flowsheet Row Most Recent Value  Chronic Disease   Chronic disease during today's visit Hypertension (HTN), Diabetes  General Interventions   General Interventions Discussed/Reviewed General Interventions Reviewed  Mental Health Interventions   Mental Health Discussed/Reviewed Mental Health Reviewed  Jorje Guild listening, ]  Advanced Directive Interventions   Advanced Directives Discussed/Reviewed Advanced Directives Discussed  [has completed documents will provide copy to office for chart]       Follow up plan: No further intervention required.   Encounter Outcome:  Pt. Visit Completed   Sammuel Hines, LCSW Social Work Care Coordination  [facility] Emmie Niemann [employer] [phone]

## 2022-11-26 ENCOUNTER — Encounter: Payer: Medicare Other | Admitting: Licensed Clinical Social Worker

## 2022-12-07 DIAGNOSIS — L821 Other seborrheic keratosis: Secondary | ICD-10-CM | POA: Diagnosis not present

## 2022-12-07 DIAGNOSIS — L820 Inflamed seborrheic keratosis: Secondary | ICD-10-CM | POA: Diagnosis not present

## 2022-12-07 DIAGNOSIS — L814 Other melanin hyperpigmentation: Secondary | ICD-10-CM | POA: Diagnosis not present

## 2022-12-07 DIAGNOSIS — Z129 Encounter for screening for malignant neoplasm, site unspecified: Secondary | ICD-10-CM | POA: Diagnosis not present

## 2022-12-07 DIAGNOSIS — D485 Neoplasm of uncertain behavior of skin: Secondary | ICD-10-CM | POA: Diagnosis not present

## 2022-12-07 DIAGNOSIS — D692 Other nonthrombocytopenic purpura: Secondary | ICD-10-CM | POA: Diagnosis not present

## 2022-12-14 ENCOUNTER — Ambulatory Visit (INDEPENDENT_AMBULATORY_CARE_PROVIDER_SITE_OTHER): Payer: Medicare Other | Admitting: Behavioral Health

## 2022-12-14 DIAGNOSIS — Z636 Dependent relative needing care at home: Secondary | ICD-10-CM | POA: Diagnosis not present

## 2022-12-14 DIAGNOSIS — F3341 Major depressive disorder, recurrent, in partial remission: Secondary | ICD-10-CM | POA: Diagnosis not present

## 2022-12-14 NOTE — Progress Notes (Signed)
[facility] Counselor/Therapist Progress Note  Patient ID: Kelli Ruiz, MRN: [id number],    Date: 12/14/2022  Time Spent: 55 min In person @ [facility] Office Time In: 10:00am Time Out: 10:55am   Treatment Type: Individual Therapy  Reported Symptoms: Elevated anx/dep & stress due to Family dynamics occurring in the home since she has initiated changes in her beh.  Mental Status Exam: Appearance:  Casual     Behavior: Appropriate and Sharing  Motor: Normal  Speech/Language:  Clear and Coherent and Normal Rate  Affect: Appropriate  Mood: normal  Thought process: normal  Thought content:   WNL  Sensory/Perceptual disturbances:   WNL  Orientation: oriented to person, place, time/date, and situation  Attention: Good  Concentration: Good  Memory: WNL  Fund of knowledge:  Good  Insight:   Good  Judgment:  Good  Impulse Control: Good   Risk Assessment: Danger to Self:  No Self-injurious Behavior: No Danger to Others: No Duty to Warn:no Physical Aggression / Violence:No  Access to Firearms a concern: No  Gang Involvement:No   Subjective: Pt is spending wknds away from the Family home where she lives w/her Str & her BIL, & her other Str who suffers from a mental d/o invl'g psychosis. She has discovered her beloved dog Meryl Dare has cancer & she will need to care for her once surgery is performed. She has decided to stay w/a friend during her dog's recovery since she cannot be fed extra & she needs a calm environment. Her Family is upset by her absences & all the changes.  Pt is truly struggling to advocate for herself, although she has been doing it w/good communication. Her younger Str is reacting angrily & her other Str is mentally ill & does not get invld much.  Pt has a Hx of always putting her Family first, but now she needs to find her voice & advocate for the life she wants.    Interventions: Solution-Oriented/Positive Psychology, Insight-Oriented, and Family  Systems  Diagnosis:Recurrent major depressive disorder, in partial remission (HCC)  Caregiver stress  Plan: Kelli Ruiz will keep in mind the psychoedu re: Family dynamics, boundaries, & the beh of others that keeps you too far from yourself. She will cont to communicate clearly & realize she can take & deserves this journey in life to work for herself also. Kelli Ruiz will record in her Notebook the thoughts, feelings, ideas, insecurities, & anxieties this brings for her so we can f/u in our next session.   Target Date: 01/14/2023  Progress: 7  Frequency: Once every 3-4 wks  Modality: Claretta Fraise, LMFT

## 2022-12-14 NOTE — Progress Notes (Signed)
                Victoria L Winstead, LMFT 

## 2022-12-18 DIAGNOSIS — G4733 Obstructive sleep apnea (adult) (pediatric): Secondary | ICD-10-CM | POA: Diagnosis not present

## 2022-12-21 DIAGNOSIS — G4733 Obstructive sleep apnea (adult) (pediatric): Secondary | ICD-10-CM | POA: Diagnosis not present

## 2022-12-29 ENCOUNTER — Ambulatory Visit: Payer: Medicare Other | Admitting: Behavioral Health

## 2022-12-30 ENCOUNTER — Encounter (INDEPENDENT_AMBULATORY_CARE_PROVIDER_SITE_OTHER): Payer: Self-pay

## 2023-01-06 ENCOUNTER — Encounter: Payer: Self-pay | Admitting: *Deleted

## 2023-01-06 ENCOUNTER — Other Ambulatory Visit: Payer: Medicare Other | Admitting: *Deleted

## 2023-01-06 NOTE — Patient Outreach (Signed)
Care Management   Visit Note  01/06/2023 Name: Kelli Ruiz MRN: [id number] DOB: 1955-08-25  Subjective: Kelli Ruiz is a 67 y.o. year old female who is a primary care patient of [name], MD. The Care Management team was consulted for assistance.      Engaged with patient spoke with patient by telephone for 03/25/23 at 215 pm   Goals Addressed             This Visit's Progress    CCM (DIABETES) EXPECTED OUTCOME: MONITOR, SELF- MANAGE AND REDUCE SYMPTOMS OF DIABETES       Current Barriers:  Knowledge Deficits related to Diabetes management Chronic Disease Management support and education needs related to Diabetes, diet Patient reports she does not consistently check CBG, states she only checks if symptomatic for hypo or hyperglycemia and has not checked recently, states she plans to start checking daily and keeping a log Patient reports she follows Mediterranean diet, pt walks her dog daily for exercise  Planned Interventions: Reviewed medications with patient and discussed importance of medication adherence;        Counseled on importance of regular laboratory monitoring as prescribed;        Advised patient, providing education and rationale, to check cbg per doctor's order  and record        call provider for findings outside established parameters;       Review of patient status, including review of consultants reports, relevant laboratory and other test results, and medications completed;       Reinforced carbohydrate modified diet Reviewed fasting blood sugar goals of 80-130 and less than 180 1 1/2-2 hours after meals  Symptom Management: Take medications as prescribed   Attend all scheduled provider appointments Call pharmacy for medication refills 3-7 days in advance of running out of medications Attend church or other social activities Perform all self care activities independently  Perform IADL's (shopping, preparing meals, housekeeping, managing  finances) independently Call provider office for new concerns or questions  check blood sugar at prescribed times: per doctor's order  check feet daily for cuts, sores or redness enter blood sugar readings and medication or insulin into daily log take the blood sugar log to all doctor visits take the blood sugar meter to all doctor visits trim toenails straight across fill half of plate with vegetables limit fast food meals to no more than 1 per week manage portion size prepare main meal at home 3 to 5 days each week read food labels for fat, fiber, carbohydrates and portion size keep feet up while sitting Please check blood sugar consistently and keep a log Continue working with counselor for resources/ assistance related to caregiver stress  Follow Up Plan: Telephone follow up appointment with care management team member scheduled for:  03/25/23 at 215 pm       CCM (HYPERTENSION) EXPECTED OUTCOME: MONITOR, SELF-MANAGE AND REDUCE SYMPTOMS OF HYPERTENSION       Current Barriers:  Knowledge Deficits related to Hypertension management Care Coordination needs related to caregiver stress/ working with Child psychotherapist  in a patient with Hypertension Chronic Disease Management support and education needs related to Hypertension, diet No Advanced Directives in place- patient reports she has documents and plans to complete Patient reports she lives with 2 sisters and 1 brother in Social worker and 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  Patient reports working with Child psychotherapist  for caregiver stress/ resources and reports " this has helped  tremendously"  pt reports she continues working with therapist. Patient reports she is independent with all aspects of her care, pt has blood pressure cuff but does not monitor blood pressure No new concerns reported  Planned Interventions Reviewed low sodium diet Reviewed medications and importance of taking as prescribed Reinforced  importance of continuing to exercise Reinforced importance of relaxation and daily self care Encouraged pt to monitor blood pressure  Symptom Management: Take medications as prescribed   Attend all scheduled provider appointments Call pharmacy for medication refills 3-7 days in advance of running out of medications Attend church or other social activities Perform all self care activities independently  Perform IADL's (shopping, preparing meals, housekeeping, managing finances) independently Call provider office for new concerns or questions  check blood pressure weekly choose a place to take my blood pressure (home, clinic or office, retail store) write blood pressure results in a log or diary learn about high blood pressure keep a blood pressure log take blood pressure log to all doctor appointments call doctor for signs and symptoms of high blood pressure keep all doctor appointments take medications for blood pressure exactly as prescribed report new symptoms to your doctor eat more whole grains, fruits and vegetables, lean meats and healthy fats Follow low sodium diet Limit fast food Practice relaxation and self care every day  Follow Up Plan: Telephone follow up appointment with care management team member scheduled for:   03/25/23 at 215 pm           Plan: Telephone follow up appointment with care management team member scheduled for: 03/25/23 at 215 pm  Irving Shows [facility], BSN [facility]/ Ambulatory Care Management [phone]

## 2023-01-06 NOTE — Patient Instructions (Signed)
Visit Information  Thank you for taking time to visit with me today. Please don't hesitate to contact me if I can be of assistance to you before our next scheduled telephone appointment.  Following are the goals we discussed today:   Goals Addressed             This Visit's Progress    CCM (DIABETES) EXPECTED OUTCOME: MONITOR, SELF- MANAGE AND REDUCE SYMPTOMS OF DIABETES       Current Barriers:  Knowledge Deficits related to Diabetes management Chronic Disease Management support and education needs related to Diabetes, diet Patient reports she does not consistently check CBG, states she only checks if symptomatic for hypo or hyperglycemia and has not checked recently, states she plans to start checking daily and keeping a log Patient reports she follows Mediterranean diet, pt walks her dog daily for exercise  Planned Interventions: Reviewed medications with patient and discussed importance of medication adherence;        Counseled on importance of regular laboratory monitoring as prescribed;        Advised patient, providing education and rationale, to check cbg per doctor's order  and record        call provider for findings outside established parameters;       Review of patient status, including review of consultants reports, relevant laboratory and other test results, and medications completed;       Reinforced carbohydrate modified diet Reviewed fasting blood sugar goals of 80-130 and less than 180 1 1/2-2 hours after meals  Symptom Management: Take medications as prescribed   Attend all scheduled provider appointments Call pharmacy for medication refills 3-7 days in advance of running out of medications Attend church or other social activities Perform all self care activities independently  Perform IADL's (shopping, preparing meals, housekeeping, managing finances) independently Call provider office for new concerns or questions  check blood sugar at prescribed times: per  doctor's order  check feet daily for cuts, sores or redness enter blood sugar readings and medication or insulin into daily log take the blood sugar log to all doctor visits take the blood sugar meter to all doctor visits trim toenails straight across fill half of plate with vegetables limit fast food meals to no more than 1 per week manage portion size prepare main meal at home 3 to 5 days each week read food labels for fat, fiber, carbohydrates and portion size keep feet up while sitting Please check blood sugar consistently and keep a log Continue working with counselor for resources/ assistance related to caregiver stress  Follow Up Plan: Telephone follow up appointment with care management team member scheduled for:  03/25/23 at 215 pm       CCM (HYPERTENSION) EXPECTED OUTCOME: MONITOR, SELF-MANAGE AND REDUCE SYMPTOMS OF HYPERTENSION       Current Barriers:  Knowledge Deficits related to Hypertension management Care Coordination needs related to caregiver stress/ working with Child psychotherapist  in a patient with Hypertension Chronic Disease Management support and education needs related to Hypertension, diet No Advanced Directives in place- patient reports she has documents and plans to complete Patient reports she lives with 2 sisters and 1 brother in Social worker and 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  Patient reports working with Child psychotherapist  for caregiver stress/ resources and reports " this has helped tremendously"  pt reports she continues working with therapist. Patient reports she is independent with all aspects of her care, pt has blood pressure  cuff but does not monitor blood pressure No new concerns reported  Planned Interventions Reviewed low sodium diet Reviewed medications and importance of taking as prescribed Reinforced importance of continuing to exercise Reinforced importance of relaxation and daily self care Encouraged pt to monitor blood  pressure  Symptom Management: Take medications as prescribed   Attend all scheduled provider appointments Call pharmacy for medication refills 3-7 days in advance of running out of medications Attend church or other social activities Perform all self care activities independently  Perform IADL's (shopping, preparing meals, housekeeping, managing finances) independently Call provider office for new concerns or questions  check blood pressure weekly choose a place to take my blood pressure (home, clinic or office, retail store) write blood pressure results in a log or diary learn about high blood pressure keep a blood pressure log take blood pressure log to all doctor appointments call doctor for signs and symptoms of high blood pressure keep all doctor appointments take medications for blood pressure exactly as prescribed report new symptoms to your doctor eat more whole grains, fruits and vegetables, lean meats and healthy fats Follow low sodium diet Limit fast food Practice relaxation and self care every day  Follow Up Plan: Telephone follow up appointment with care management team member scheduled for:   03/25/23 at 215 pm           Our next appointment is by telephone on 03/25/23 at 215 pm  Please call the care guide team at [phone] if you need to cancel or reschedule your appointment.   If you are experiencing a Mental Health or Behavioral Health Crisis or need someone to talk to, please call the Suicide and Crisis Lifeline: 988 call the [place] National Suicide Prevention Lifeline: 250-532-3664 or TTY: 424 025 8329 TTY (339)871-7733) to talk to a trained counselor call 1-800-273-TALK (toll free, 24 hour hotline) go to [facility] [address], [place] [phone]) call 911   Patient verbalizes understanding of instructions and care plan provided today and agrees to view in MyChart. Active MyChart status and patient  understanding of how to access instructions and care plan via MyChart confirmed with patient.     Telephone follow up appointment with care management team member scheduled for:  03/25/23 at 215 pm  Irving Shows [facility], BSN [place]/ Ambulatory Care Management [phone]

## 2023-01-11 ENCOUNTER — Other Ambulatory Visit: Payer: Self-pay | Admitting: Family Medicine

## 2023-01-11 ENCOUNTER — Encounter: Payer: Self-pay | Admitting: Family Medicine

## 2023-01-11 ENCOUNTER — Ambulatory Visit (INDEPENDENT_AMBULATORY_CARE_PROVIDER_SITE_OTHER): Payer: Medicare Other | Admitting: Family Medicine

## 2023-01-11 VITALS — BP 126/78 | HR 69 | Temp 97.8°F | Wt 154.8 lb

## 2023-01-11 DIAGNOSIS — E1159 Type 2 diabetes mellitus with other circulatory complications: Secondary | ICD-10-CM

## 2023-01-11 DIAGNOSIS — E1169 Type 2 diabetes mellitus with other specified complication: Secondary | ICD-10-CM | POA: Diagnosis not present

## 2023-01-11 DIAGNOSIS — E785 Hyperlipidemia, unspecified: Secondary | ICD-10-CM

## 2023-01-11 DIAGNOSIS — Z1211 Encounter for screening for malignant neoplasm of colon: Secondary | ICD-10-CM

## 2023-01-11 DIAGNOSIS — Z23 Encounter for immunization: Secondary | ICD-10-CM

## 2023-01-11 DIAGNOSIS — I152 Hypertension secondary to endocrine disorders: Secondary | ICD-10-CM | POA: Diagnosis not present

## 2023-01-11 DIAGNOSIS — G4733 Obstructive sleep apnea (adult) (pediatric): Secondary | ICD-10-CM

## 2023-01-11 DIAGNOSIS — F3341 Major depressive disorder, recurrent, in partial remission: Secondary | ICD-10-CM

## 2023-01-11 DIAGNOSIS — Z7984 Long term (current) use of oral hypoglycemic drugs: Secondary | ICD-10-CM

## 2023-01-11 DIAGNOSIS — R42 Dizziness and giddiness: Secondary | ICD-10-CM

## 2023-01-11 DIAGNOSIS — E119 Type 2 diabetes mellitus without complications: Secondary | ICD-10-CM

## 2023-01-11 MED ORDER — AMLODIPINE BESYLATE 5 MG PO TABS
5.0000 mg | ORAL_TABLET | Freq: Every day | ORAL | 3 refills | Status: DC
Start: 2023-01-11 — End: 2023-11-15

## 2023-01-11 NOTE — Patient Instructions (Signed)
Schedule a lab appointment after September 6th to get your fasting blood work done, including metabolic panel, liver, kidney, cholesterol, and thyroid levels. Continue taking your current medications: amlodipine 5mg  daily, rosuvastatin 10 mg, citalopram 40mg , and Farxiga 5mg  as prescribed. Monitor your blood sugar levels at home when possible, aiming for daily checks as your situation allows. Get your flu shot and pneumonia shot today; consider receiving the RSV and updated COVID vaccines soon for better respiratory protection. Follow up with your rheumatologist doctor on the newly scheduled date of September 9th

## 2023-01-11 NOTE — Assessment & Plan Note (Signed)
Diabetes is being managed with Farxiga 5 mg. Previous urinary symptoms resolved with dose reduction. Plan to resume regular blood sugar monitoring. Fasting blood work including A1C, metabolic panel, and lipid profile

## 2023-01-11 NOTE — Assessment & Plan Note (Signed)
On rosuvastatin 10 mg daily,   Schedule lipid panel at the next visit. Encourage dietary changes and physical activity.

## 2023-01-11 NOTE — Assessment & Plan Note (Signed)
Well controlled on amlodipine 5 mg.

## 2023-01-11 NOTE — Progress Notes (Signed)
Assessment/Plan:   Problem List Items Addressed This Visit       Cardiovascular and Mediastinum   Hypertension associated with diabetes (HCC)    Well-controlled on amlodipine 5 mg      Relevant Medications   amLODipine (NORVASC) 5 MG tablet     Respiratory   OSA on CPAP     Endocrine   Diabetes mellitus (HCC) - Primary    Diabetes is being managed with Farxiga 5 mg. Previous urinary symptoms resolved with dose reduction. Plan to resume regular blood sugar monitoring. Fasting blood work including A1C, metabolic panel, and lipid profile       Hyperlipidemia associated with type 2 diabetes mellitus (HCC)    On rosuvastatin 10 mg daily,   Schedule lipid panel at the next visit. Encourage dietary changes and physical activity.      Relevant Medications   amLODipine (NORVASC) 5 MG tablet     Other   MDD (major depressive disorder)    Mood stable on citalopram 40 mg and gabapentin. Continue current medication and follow-up with counselor as scheduled.      Other Visit Diagnoses     Immunization due       Relevant Orders   Pneumococcal conjugate vaccine 20-valent (Completed)   Flu Vaccine Trivalent High Dose (Fluad) (Completed)   Screening for colon cancer       Relevant Orders   Cologuard       Medications Discontinued During This Encounter  Medication Reason   amLODipine (NORVASC) 5 MG tablet Reorder    Return in about 4 months (around 05/13/2023) for BP, DM, fasting labs, HLD.    Subjective:   Encounter date: 01/11/2023  Kelli Ruiz is a 67 y.o. female who has S/P conversion right knee; Diabetes mellitus (HCC); Hypertension associated with diabetes (HCC); Hyperlipidemia associated with type 2 diabetes mellitus (HCC); Tremor; OSA (obstructive sleep apnea); Swelling of both hands; Excessive daytime sleepiness; OSA on CPAP; Non-restorative sleep; RLS (restless legs syndrome); Bilateral lower extremity edema; Vertigo; Caregiver stress; Ocular myokymia;  Rheumatoid arthritis involving multiple sites [facility]); MDD (major depressive disorder); Urinary frequency; and Multiple nevi on their problem list..   She  has a past medical history of Arthritis, Complication of anesthesia, Depression, Diabetes mellitus without complication (HCC), Hypertension, Neuromuscular disorder (HCC), Obese (01/19/2017), RLS (restless legs syndrome), Sleep apnea, and Vertigo..   Chief Complaint: Follow-up for chronic conditions including diabetes, hypertension, and hypercholesterolemia; medication refill requests.  History of Present Illness:  Diabetes: The patient has a history of diabetes and was previously monitoring blood sugar levels daily. However, due to personal challenges, including caring for her dog, she has not been able to check her blood sugar levels recently. Patient reports that previously there were urinary symptoms on Farxiga, hence the dose was reduced to 5 mg, which alleviated the symptoms.  Hypertension: The patient is currently on amlodipine 5 mg. She mentioned that a recent refill from the new pharmacy omitted amlodipine. Blood pressure today is 120/78 mmHg, within the diabetic target range.  Hypercholesterolemia: The patient remains on rosuvastatin. No muscle aches reported.   Depression/Anxiety: The patient is on citalopram 40 mg for mood and reports feeling better lately. She also utilizes gabapentin for leg cramps, which additionally helps with mood stabilization.     10/21/2022   10:49 AM 07/30/2022   12:56 PM 07/19/2022   11:08 AM 04/13/2022   10:54 AM 02/04/2022    2:05 PM  Depression screen PHQ 2/9  Decreased Interest 1 0  0 0 0  Down, Depressed, Hopeless 1 0 0 0 0  PHQ - 2 Score 2 0 0 0 0  Altered sleeping 0  0 0   Tired, decreased energy 1  0 0   Change in appetite 1  0 1   Feeling bad or failure about yourself  0  0 0   Trouble concentrating 0  0 0   Moving slowly or fidgety/restless 0  0 0   Suicidal thoughts 0  0 0   PHQ-9 Score 4   0 1   Difficult doing work/chores Somewhat difficult  Not difficult at all Not difficult at all       10/21/2022   10:50 AM 07/19/2022   11:08 AM 04/13/2022   10:55 AM  GAD 7 : Generalized Anxiety Score  Nervous, Anxious, on Edge 1 0 0  Control/stop worrying 0 0 0  Worry too much - different things 0 0 0  Trouble relaxing 1 0 0  Restless 0 0 0  Easily annoyed or irritable 1 0 0  Afraid - awful might happen 1 0 1  Total GAD 7 Score 4 0 1  Anxiety Difficulty Not difficult at all Not difficult at all Not difficult at all      Review of Systems  Constitutional:  Negative for chills, diaphoresis, fever, malaise/fatigue and weight loss.  HENT:  Negative for congestion, ear discharge, ear pain and hearing loss.   Eyes:  Negative for blurred vision, double vision, photophobia, pain, discharge and redness.  Respiratory:  Negative for cough, sputum production, shortness of breath and wheezing.   Cardiovascular:  Negative for chest pain and palpitations.  Gastrointestinal:  Negative for abdominal pain, blood in stool, constipation, diarrhea, heartburn, melena, nausea and vomiting.  Genitourinary:  Negative for dysuria, flank pain, frequency, hematuria and urgency.  Musculoskeletal:  Negative for myalgias.  Skin:  Negative for itching and rash.  Neurological:  Negative for dizziness, tingling, tremors, speech change, seizures, loss of consciousness, weakness and headaches.  Endo/Heme/Allergies:  Positive for polydipsia.  All other systems reviewed and are negative.   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 Use up to four times daily as directed. (FOR ICD-10 E10.9, E11.9). 1 each 0   cetirizine (ALLERGY, CETIRIZINE,) 10 MG tablet Take 1 tablet (10 mg total) by mouth daily. 90 tablet 1   citalopram (CELEXA) 40 MG tablet Take 1 tablet (40 mg total) by mouth daily. 90 tablet 1   dapagliflozin propanediol (FARXIGA) 5 MG TABS tablet Take 1 tablet (5 mg total) by mouth daily. Patient receives via [name] Patient Assistance 90 tablet 0   diclofenac Sodium (VOLTAREN) 1 % GEL Apply 4 g topically 4 (four) times daily as needed. 100 g 3   fluticasone (FLONASE) 50 MCG/ACT nasal spray Place 2 sprays into both nostrils daily as needed for allergies or rhinitis. 16 g 3   gabapentin (NEURONTIN) 600 MG  tablet Take 1 tablet (600 mg total) by mouth 2 (two) times daily. 180 tablet 1   glucose blood test strip Use as instructed 100 each 12   Lancets MISC 1 each by Does not apply route in the morning, at noon, in the evening, and at bedtime. 100 each 12   meclizine (ANTIVERT) 12.5 MG tablet Take 1 tablet (12.5 mg total) by mouth 3 (three) times daily as needed for dizziness. 30 tablet 0   rosuvastatin (CRESTOR) 10 MG tablet TAKE ONE TABLET BY MOUTH EVERYDAY AT BEDTIME 90 tablet 2   amLODipine (NORVASC) 5 MG tablet Take 1 tablet (5 mg total) by mouth daily. 90 tablet 1   No facility-administered medications prior to visit.    Family History  Problem Relation Age of Onset   Diabetes Mother    Heart attack Mother    Kidney disease Mother    Arthritis Father    Stroke Father    Hypertension Father    Cancer Sister    Dementia Sister    Schizophrenia Sister    Breast cancer Neg Hx     Social History   Socioeconomic History   Marital status: Single    Spouse name: Not on file   Number of children: Not on file   Years of education: Not  on file   Highest education level: Not on file  Occupational History   Not on file  Tobacco Use   Smoking status: Former    Current packs/day: 0.00    Types: Cigarettes    Start date: 01/09/1997    Quit date: 01/09/2017    Years since quitting: 6.0   Smokeless tobacco: Never   Tobacco comments:    Pt uses vapor  Vaping Use   Vaping status: Some Days  Substance and Sexual Activity   Alcohol use: No   Drug use: No   Sexual activity: Not on file  Other Topics Concern   Not on file  Social History Narrative   Not on file   Social Determinants of Health   Financial Resource Strain: Low Risk  (07/30/2022)   Overall Financial Resource Strain (CARDIA)    Difficulty of Paying Living Expenses: Not hard at all  Food Insecurity: No Food Insecurity (07/30/2022)   Hunger Vital Sign    Worried About Running Out of Food in the Last Year: Never true    Ran Out of Food in the Last Year: Never true  Transportation Needs: No Transportation Needs (07/30/2022)   PRAPARE - Administrator, Civil Service (Medical): No    Lack of Transportation (Non-Medical): No  Physical Activity: Sufficiently Active (07/30/2022)   Exercise Vital Sign    Days of Exercise per Week: 7 days    Minutes of Exercise per Session: 30 min  Stress: No Stress Concern Present (11/25/2022)   [employer] of Occupational Health - Occupational Stress Questionnaire    Feeling of Stress : Only a little  Social Connections: Socially Isolated (07/30/2022)   Social Connection and Isolation Panel [NHANES]    Frequency of Communication with Friends and Family: More than three times a week    Frequency of Social Gatherings with Friends and Family: More than three times a week    Attends Religious Services: Never    Database administrator or Organizations: No    Attends Banker Meetings: Never    Marital Status: Never married  Intimate Partner Violence: Not At Risk (07/30/2022)   Humiliation, Afraid, Rape,  and Kick questionnaire    Fear of Current or Ex-Partner: No    Emotionally Abused: No    Physically Abused: No    Sexually Abused: No                                                                                                  Objective:  Physical Exam: BP 126/78 (BP Location: Left Arm, Patient Position: Sitting, Cuff Size: Large)   Pulse 69   Temp 97.8 F (36.6 C) (Temporal)   Wt 154 lb 12.8 oz (70.2 kg)   SpO2 99%   BMI 26.57 kg/m   Wt Readings from Last 3 Encounters:  01/11/23 154 lb 12.8 oz (70.2 kg)  10/21/22 157 lb (71.2 kg)  08/17/22 158 lb (71.7 kg)     Physical Exam  MM 3D SCREENING MAMMOGRAM BILATERAL BREAST  Result Date: 11/03/2022 CLINICAL DATA:  Screening. EXAM: DIGITAL SCREENING BILATERAL MAMMOGRAM WITH TOMOSYNTHESIS AND CAD TECHNIQUE: Bilateral screening digital craniocaudal and mediolateral oblique mammograms were obtained. Bilateral screening digital breast tomosynthesis was performed. The images were evaluated with computer-aided detection. COMPARISON:  Previous exam(s). ACR Breast Density Category a: The breasts are almost entirely fatty. FINDINGS: There are no findings suspicious for malignancy. IMPRESSION: No mammographic evidence of malignancy. A result letter of this screening mammogram will be mailed directly to the patient. RECOMMENDATION: Screening mammogram in one year. (Code:SM-B-01Y) BI-RADS CATEGORY  1: Negative. Electronically Signed   By: Harmon Pier M.D.   On: 11/03/2022 14:15    Recent Results (from the past 2160 hour(s))  POCT glycosylated hemoglobin (Hb A1C)     Status: Abnormal   Collection Time: 10/21/22 10:34 AM  Result Value Ref Range   Hemoglobin A1C 7.3 (A) 4.0 - 5.6 %   HbA1c POC (<> result, manual entry)     HbA1c, POC (prediabetic range)     HbA1c, POC (controlled diabetic range)    Urinalysis w microscopic + reflex cultur     Status: Abnormal   Collection Time: 10/21/22 10:42 AM   Specimen: Urine  Result Value Ref Range    Color, Urine YELLOW YELLOW   APPearance CLEAR CLEAR   Specific Gravity, Urine 1.041 (H) 1.001 - 1.035   pH 5.5 5.0 - 8.0   Glucose, UA 3+ (A) NEGATIVE   Bilirubin Urine NEGATIVE NEGATIVE   Ketones, ur NEGATIVE NEGATIVE   Hgb urine dipstick NEGATIVE NEGATIVE   Protein, ur NEGATIVE NEGATIVE   Nitrites, Initial NEGATIVE NEGATIVE   Leukocyte Esterase NEGATIVE NEGATIVE   WBC, UA 0-5 0 - 5 /HPF   RBC / HPF NONE SEEN 0 - 2 /HPF   Squamous Epithelial / HPF 0-5 < OR = 5 /HPF   Bacteria, UA NONE SEEN NONE SEEN /HPF   Hyaline Cast NONE SEEN NONE SEEN /LPF   Note      Comment: This urine was analyzed for the presence of WBC,  RBC, bacteria, casts, and other formed elements.  Only those elements seen were reported. . .   REFLEXIVE URINE CULTURE     Status: None   Collection Time: 10/21/22 10:42  AM  Result Value Ref Range   Reflexve Urine Culture      Comment: NO CULTURE INDICATED        Garner Nash, MD, MS

## 2023-01-11 NOTE — Assessment & Plan Note (Signed)
Mood stable on citalopram 40 mg and gabapentin. Continue current medication and follow-up with counselor as scheduled.

## 2023-01-14 ENCOUNTER — Telehealth: Payer: Self-pay | Admitting: Family Medicine

## 2023-01-14 ENCOUNTER — Other Ambulatory Visit: Payer: Self-pay | Admitting: Family Medicine

## 2023-01-14 DIAGNOSIS — Z794 Long term (current) use of insulin: Secondary | ICD-10-CM

## 2023-01-14 NOTE — Telephone Encounter (Signed)
Verbal given to pharmacy.

## 2023-01-14 NOTE — Telephone Encounter (Signed)
[facility] pharmacy called Alphonzo Lemmings [phone] ) said they need a verbal ok from the fill the medication

## 2023-01-18 ENCOUNTER — Ambulatory Visit: Payer: Medicare Other | Admitting: Behavioral Health

## 2023-01-19 ENCOUNTER — Ambulatory Visit: Payer: Medicare Other | Admitting: Family Medicine

## 2023-01-24 ENCOUNTER — Ambulatory Visit: Payer: Medicare Other | Admitting: Family Medicine

## 2023-01-25 NOTE — Progress Notes (Signed)
Office Visit Note  Patient: Kelli Ruiz             Date of Birth: [birth year]           MRN: [id number]             PCP: [name], MD Referring: [name], MD Visit Date: 02/08/2023 Occupation: @GUAROCC @  Subjective:  Pain in both hands  History of Present Illness: Kelli Ruiz is a 67 y.o. female with seronegative rheumatoid arthritis, and osteoarthritis.  She returns for follow-up visit today.  She states she continues to have some pain and stiffness in her hands.  She notices intermittent swelling over MCP joints.  She has not noticed swelling or discomfort in any other joints.  She uses topical Voltaren gel as needed.    Activities of Daily Living:  Patient reports morning stiffness for 5 minutes.   Patient Denies nocturnal pain.  Difficulty dressing/grooming: Denies Difficulty climbing stairs: Denies Difficulty getting out of chair: Denies Difficulty using hands for taps, buttons, cutlery, and/or writing: Denies  Review of Systems  Constitutional:  Negative for fatigue.  HENT:  Positive for mouth dryness.   Eyes:  Negative for dryness.  Respiratory:  Negative for cough and shortness of breath.   Cardiovascular:  Negative for chest pain and palpitations.  Gastrointestinal:  Positive for constipation. Negative for blood in stool and diarrhea.  Endocrine: Negative for increased urination.  Genitourinary:  Negative for difficulty urinating.  Musculoskeletal:  Positive for joint pain, joint pain and joint swelling. Negative for gait problem, myalgias and myalgias.  Skin:  Negative for color change, rash and sensitivity to sunlight.  Allergic/Immunologic: Negative for susceptible to infections.  Neurological:  Negative for headaches.  Hematological:  Negative for swollen glands.  Psychiatric/Behavioral:  Negative for depressed mood and sleep disturbance. The patient is nervous/anxious.     PMFS History:  Patient Active Problem List   Diagnosis Date  Noted   MDD (major depressive disorder) 10/21/2022   Urinary frequency 10/21/2022   Multiple nevi 10/21/2022   Vertigo 07/19/2022   Caregiver stress 07/19/2022   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   Rheumatoid arthritis involving multiple sites (HCC) 07/19/2022   Bilateral lower extremity edema 04/18/2022   Excessive daytime sleepiness 01/25/2022   OSA on CPAP 01/25/2022   Non-restorative sleep 01/25/2022   RLS (restless legs syndrome) 01/25/2022   Diabetes mellitus (HCC) 11/23/2021   Hypertension associated with diabetes (HCC) 11/23/2021   Hyperlipidemia associated with type 2 diabetes mellitus (HCC) 11/23/2021   Tremor 11/23/2021   OSA (obstructive sleep apnea) 11/23/2021   Swelling of both hands 11/23/2021   S/P conversion right knee 01/18/2017    Past Medical History:  Diagnosis Date   Arthritis    Complication of anesthesia    " they had a little bit of a hard time waking me after they took a nerve out of my leg"    Depression    Diabetes mellitus without complication (HCC)    Hypertension    Neuromuscular disorder (HCC)    Obese 01/19/2017   RLS (restless legs syndrome)    Sleep apnea    Vertigo     Family History  Problem Relation Age of Onset   Diabetes Mother    Heart attack Mother    Kidney disease Mother    Arthritis Father    Stroke Father    Hypertension Father    Cancer Sister    Dementia Sister    Schizophrenia Sister  Breast cancer Neg Hx    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;  Surgeon: Durene Romans, MD;  Location: [facility];  Service: Orthopedics;  Laterality: Right;  90 mins with regional block   DILATION AND CURETTAGE OF UTERUS  12- 2014 or 2015   TONSILLECTOMY     unicompartmental knee  2016   Social History   Social History Narrative   Not on file   Immunization  History  Administered Date(s) Administered   Fluad Trivalent(High Dose 65+) 01/11/2023   Influenza Inj Mdck Quad Pf 02/14/2017   Influenza Split 03/24/2015   PNEUMOCOCCAL CONJUGATE-20 01/11/2023     Objective: Vital Signs: BP 111/71 (BP Location: Left Arm, Patient Position: Sitting, Cuff Size: Normal)   Pulse 73   Resp 15   Ht 5\' 4"  (1.626 m)   Wt 151 lb (68.5 kg)   BMI 25.92 kg/m    Physical Exam Vitals and nursing note reviewed.  Constitutional:      Appearance: She is well-developed.  HENT:     Head: Normocephalic and atraumatic.  Eyes:     Conjunctiva/sclera: Conjunctivae normal.  Cardiovascular:     Rate and Rhythm: Normal rate and regular rhythm.     Heart sounds: Normal heart sounds.  Pulmonary:     Effort: Pulmonary effort is normal.     Breath sounds: Normal breath sounds.  Abdominal:     General: Bowel sounds are normal.     Palpations: Abdomen is soft.  Musculoskeletal:     Cervical back: Normal range of motion.  Lymphadenopathy:     Cervical: No cervical adenopathy.  Skin:    General: Skin is warm and dry.     Capillary Refill: Capillary refill takes less than 2 seconds.  Neurological:     Mental Status: She is alert and oriented to person, place, and time.  Psychiatric:        Behavior: Behavior normal.      Musculoskeletal Exam: She had limited lateral rotation of the cervical spine with some stiffness.  Thoracic kyphosis was noted without any tenderness.  Shoulder joints, elbow joints, wrist joints were in good range of motion.  She had MCP thickening but no synovitis was noted.  PIP and DIP thickening was noted.  Hip joints and knee joints in good range of motion.  There was no tenderness over ankles or MTPs.  CDAI Exam: CDAI Score: -- Patient Global: 10 / 100; Provider Global: 10 / 100 Swollen: --; Tender: -- Joint Exam 02/08/2023   No joint exam has been documented for this visit   There is currently no information documented on the  homunculus. Go to the Rheumatology activity and complete the homunculus joint exam.  Investigation: No additional findings.  Imaging: No results found.  Recent Labs: Lab Results  Component Value Date   WBC 6.5 06/25/2022   HGB 16.9 (H) 06/25/2022   PLT 267 06/25/2022   NA 139 06/25/2022   K 4.1 06/25/2022   CL 102 06/25/2022   CO2 26 06/25/2022   GLUCOSE 114 (H) 06/25/2022   BUN 15 06/25/2022   CREATININE 0.92 06/25/2022   BILITOT 0.8 06/25/2022   ALKPHOS 62 06/25/2022   AST 37 06/25/2022   ALT 42 06/25/2022   PROT 8.6 (H) 06/25/2022   ALBUMIN 4.5 06/25/2022   CALCIUM 9.7 06/25/2022  GFRAA >60 01/19/2017    Speciality Comments: PLQ Eye Exam: 08/05/2022 WNL @ [facility] Follow up in 1 year  Procedures:  No procedures performed Allergies: Bee pollen   Assessment / Plan:     Visit Diagnoses: Rheumatoid arthritis of multiple sites with negative rheumatoid factor (HCC) - RF negative, anti-CCP negative,ANA -,elevated ESR.  Patient had no synovitis on the examination today.  She continues to have thickening of the MCPs PIPs and DIP joints.  She declined hydroxychloroquine use at the last visit.  She has been using Voltaren gel on as needed basis.  I will schedule ultrasound of bilateral hands to look for synovitis.  Pain in both hands -she complains of pain and stiffness in her hands intermittently.  No synovitis was noted on the examination today.  X-rays were consistent with rheumatoid arthritis and osteoarthritis overlap.  Primary osteoarthritis of both feet-patient with no synovitis or tenderness on the examination.  Status post revision of total knee, right - partial knee replacement in 2016 and then revision in 01/2017.  Doing well.  DDD (degenerative disc disease), cervical -she had limited lateral rotation of the cervical spine with some stiffness.  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  Other medical problems are listed as follows:  Hypertension associated with diabetes (HCC)  Type 2 diabetes mellitus with hyperglycemia, without long-term current use of insulin (HCC)  Bilateral lower extremity edema  Hyperlipidemia associated with type 2 diabetes mellitus (HCC)  RLS (restless legs syndrome)  Non-restorative sleep  OSA on CPAP  Orders: No orders of the defined types were placed in this encounter.  No orders of the defined types were placed in this encounter.    Follow-Up Instructions: Return in 6 months (on 08/08/2023) for Rheumatoid arthritis, Osteoarthritis.   Pollyann Savoy, MD  Note - This record has been created using Animal nutritionist.  Chart creation errors have been sought, but may not always  have been located. Such creation errors do not reflect on  the standard of medical care.

## 2023-01-31 ENCOUNTER — Other Ambulatory Visit: Payer: Self-pay | Admitting: Family Medicine

## 2023-01-31 DIAGNOSIS — E1165 Type 2 diabetes mellitus with hyperglycemia: Secondary | ICD-10-CM

## 2023-02-08 ENCOUNTER — Encounter: Payer: Self-pay | Admitting: Rheumatology

## 2023-02-08 ENCOUNTER — Ambulatory Visit: Payer: Medicare Other | Attending: Rheumatology | Admitting: Rheumatology

## 2023-02-08 ENCOUNTER — Encounter: Payer: Self-pay | Admitting: Family Medicine

## 2023-02-08 VITALS — BP 111/71 | HR 73 | Resp 15 | Ht 64.0 in | Wt 151.0 lb

## 2023-02-08 DIAGNOSIS — E1159 Type 2 diabetes mellitus with other circulatory complications: Secondary | ICD-10-CM

## 2023-02-08 DIAGNOSIS — G478 Other sleep disorders: Secondary | ICD-10-CM | POA: Diagnosis not present

## 2023-02-08 DIAGNOSIS — M0609 Rheumatoid arthritis without rheumatoid factor, multiple sites: Secondary | ICD-10-CM

## 2023-02-08 DIAGNOSIS — E1165 Type 2 diabetes mellitus with hyperglycemia: Secondary | ICD-10-CM

## 2023-02-08 DIAGNOSIS — G4733 Obstructive sleep apnea (adult) (pediatric): Secondary | ICD-10-CM

## 2023-02-08 DIAGNOSIS — I152 Hypertension secondary to endocrine disorders: Secondary | ICD-10-CM

## 2023-02-08 DIAGNOSIS — E1169 Type 2 diabetes mellitus with other specified complication: Secondary | ICD-10-CM

## 2023-02-08 DIAGNOSIS — M19071 Primary osteoarthritis, right ankle and foot: Secondary | ICD-10-CM

## 2023-02-08 DIAGNOSIS — M79641 Pain in right hand: Secondary | ICD-10-CM

## 2023-02-08 DIAGNOSIS — M5030 Other cervical disc degeneration, unspecified cervical region: Secondary | ICD-10-CM

## 2023-02-08 DIAGNOSIS — M19072 Primary osteoarthritis, left ankle and foot: Secondary | ICD-10-CM

## 2023-02-08 DIAGNOSIS — R600 Localized edema: Secondary | ICD-10-CM

## 2023-02-08 DIAGNOSIS — G2581 Restless legs syndrome: Secondary | ICD-10-CM | POA: Diagnosis not present

## 2023-02-08 DIAGNOSIS — M79642 Pain in left hand: Secondary | ICD-10-CM

## 2023-02-08 DIAGNOSIS — M5136 Other intervertebral disc degeneration, lumbar region: Secondary | ICD-10-CM

## 2023-02-08 DIAGNOSIS — Z96651 Presence of right artificial knee joint: Secondary | ICD-10-CM

## 2023-02-08 DIAGNOSIS — E785 Hyperlipidemia, unspecified: Secondary | ICD-10-CM

## 2023-02-16 ENCOUNTER — Ambulatory Visit: Payer: Medicare Other | Admitting: Rheumatology

## 2023-02-28 ENCOUNTER — Ambulatory Visit (INDEPENDENT_AMBULATORY_CARE_PROVIDER_SITE_OTHER): Payer: Medicare Other

## 2023-02-28 DIAGNOSIS — Z0000 Encounter for general adult medical examination without abnormal findings: Secondary | ICD-10-CM

## 2023-02-28 NOTE — Progress Notes (Signed)
Subjective:   Kelli Ruiz is a 67 y.o. female who presents for Medicare Annual (Subsequent) preventive examination.  Visit Complete: Virtual I connected with  Dwayne A Uresti on 02/28/23 by a audio enabled telemedicine application and verified that I am speaking with the correct person using two identifiers.  Patient Location: Home  Provider Location: Office/Clinic  I discussed the limitations of evaluation and management by telemedicine. The patient expressed understanding and agreed to proceed.  Vital Signs: Because this visit was a virtual/telehealth visit, some criteria may be missing or patient reported. Any vitals not documented were not able to be obtained and vitals that have been documented are patient reported.  Patient Medicare AWV questionnaire was completed by the patient on 02/21/2023; I have confirmed that all information answered by patient is correct and no changes since this date.  Cardiac Risk Factors include: advanced age (>66men, >78 women);diabetes mellitus;dyslipidemia;hypertension     Objective:    Today's Vitals   02/28/23 1112  PainSc: 2    There is no height or weight on file to calculate BMI.     02/28/2023   11:24 AM 07/30/2022    1:20 PM 03/09/2022    2:43 PM 02/04/2022    2:07 PM 11/14/2021   11:18 AM 01/18/2017    2:49 PM 01/13/2017    2:44 PM  Advanced Directives  Does Patient Have a Medical Advance Directive? No No Yes No No No No  Type of Surveyor, minerals;Living will      Copy of Healthcare Power of Attorney in Chart?   No - copy requested      Would patient like information on creating a medical advance directive?  No - Patient declined  Yes (MAU/Ambulatory/Procedural Areas - Information given)  No - Patient declined No - Patient declined    Current Medications (verified) Outpatient Encounter Medications as of 02/28/2023  Medication Sig   ACCU-CHEK GUIDE test strip USE TO TEST BLOOD SUGAR ONCE DAILY AS  DIRECTED   Accu-Chek Softclix Lancets lancets USE TO TEST BLOOD SUGAR DAILY AS DIRECTED   amLODipine (NORVASC) 5 MG tablet Take 1 tablet (5 mg total) by mouth daily.   blood glucose meter kit and supplies Dispense based on patient and insurance preference. Use up to four times daily as directed. (FOR ICD-10 E10.9, E11.9).   cetirizine (ALLERGY, CETIRIZINE,) 10 MG tablet Take 1 tablet (10 mg total) by mouth daily.   citalopram (CELEXA) 40 MG tablet Take 1 tablet (40 mg total) by mouth daily.   diclofenac Sodium (VOLTAREN) 1 % GEL Apply 4 g topically 4 (four) times daily as needed.   FARXIGA 5 MG TABS tablet Take 5 mg by mouth daily.   fluticasone (FLONASE) 50 MCG/ACT nasal spray Place 2 sprays into both nostrils daily as needed for allergies or rhinitis.   gabapentin (NEURONTIN) 600 MG tablet TAKE 1 TABLET BY MOUTH AT BREAKFAST AND AT BEDTIME   meclizine (ANTIVERT) 12.5 MG tablet TAKE 1 TABLET BY MOUTH 3 TIMES DAILY AS NEEDED FOR DIZZINESS *REFILL REQUEST*   rosuvastatin (CRESTOR) 10 MG tablet TAKE ONE TABLET BY MOUTH EVERYDAY AT BEDTIME   No facility-administered encounter medications on file as of 02/28/2023.    Allergies (verified) Bee pollen   History: Past Medical History:  Diagnosis Date   Arthritis    Complication of anesthesia    " they had a little bit of a hard time waking me after they took a nerve out of  my leg"    Depression    Diabetes mellitus without complication (HCC)    Hypertension    Neuromuscular disorder (HCC)    Obese 01/19/2017   RLS (restless legs syndrome)    Sleep apnea    Vertigo    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;  Surgeon: Durene Romans, MD;  Location: [facility];  Service: Orthopedics;  Laterality: Right;  90 mins with regional block   DILATION AND CURETTAGE OF UTERUS   12- 2014 or 2015   TONSILLECTOMY     unicompartmental knee  2016   Family History  Problem Relation Age of Onset   Diabetes Mother    Heart attack Mother    Kidney disease Mother    Arthritis Father    Stroke Father    Hypertension Father    Cancer Sister    Dementia Sister    Schizophrenia Sister    Breast cancer Neg Hx    Social History   Socioeconomic History   Marital status: Single    Spouse name: Not on file   Number of children: Not on file   Years of education: Not on file   Highest education level: Not on file  Occupational History   Not on file  Tobacco Use   Smoking status: Former    Current packs/day: 0.00    Types: Cigarettes    Start date: 01/09/1997    Quit date: 01/09/2017    Years since quitting: 6.1    Passive exposure: Past   Smokeless tobacco: Never   Tobacco comments:    Pt uses vapor  Vaping Use   Vaping status: Former  Substance and Sexual Activity   Alcohol use: No   Drug use: No   Sexual activity: Not on file  Other Topics Concern   Not on file  Social History Narrative   Not on file   Social Determinants of Health   Financial Resource Strain: Low Risk  (02/28/2023)   Overall Financial Resource Strain (CARDIA)    Difficulty of Paying Living Expenses: Not hard at all  Food Insecurity: No Food Insecurity (02/28/2023)   Hunger Vital Sign    Worried About Running Out of Food in the Last Year: Never true    Ran Out of Food in the Last Year: Never true  Transportation Needs: No Transportation Needs (02/28/2023)   PRAPARE - Administrator, Civil Service (Medical): No    Lack of Transportation (Non-Medical): No  Physical Activity: Insufficiently Active (02/28/2023)   Exercise Vital Sign    Days of Exercise per Week: 7 days    Minutes of Exercise per Session: 20 min  Stress: No Stress Concern Present (02/28/2023)   [employer] of Occupational Health - Occupational Stress Questionnaire    Feeling of Stress : Not at all   Social Connections: Socially Isolated (02/28/2023)   Social Connection and Isolation Panel [NHANES]    Frequency of Communication with Friends and Family: Three times a week    Frequency of Social Gatherings with Friends and Family: Once a week    Attends Religious Services: Never    Database administrator or Organizations: No    Attends Banker Meetings: Never    Marital Status: Never married    Tobacco Counseling Counseling given: Not Answered Tobacco comments: Pt uses  vapor   Clinical Intake:  Pre-visit preparation completed: Yes  Pain : 0-10 Pain Score: 2  Pain Type: Chronic pain Pain Location: Generalized Pain Descriptors / Indicators: Aching Pain Onset: More than a month ago Pain Frequency: Constant     Nutritional Risks: None Diabetes: Yes CBG done?: No Did pt. bring in CBG monitor from home?: No  How often do you need to have someone help you when you read instructions, pamphlets, or other written materials from your doctor or pharmacy?: 1 - Never  Interpreter Needed?: No  Information entered by :: NAllen LPN   Activities of Daily Living    02/28/2023   11:13 AM 02/21/2023    5:19 PM  In your present state of health, do you have any difficulty performing the following activities:  Hearing? 0 0  Vision? 0 1  Difficulty concentrating or making decisions? 0 1  Walking or climbing stairs? 1 1  Comment due to knees   Dressing or bathing? 0 0  Doing errands, shopping? 0 0  Preparing Food and eating ? N N  Using the Toilet? N Y  In the past six months, have you accidently leaked urine? Y Y  Comment due to farxiga   Do you have problems with loss of bowel control? N N  Managing your Medications? N N  Managing your Finances? N N  Housekeeping or managing your Housekeeping? [place]    Patient Care Team: [name], MD as PCP - General (Family Medicine) Gaspar Cola, [facility] (Inactive) as Pharmacist (Pharmacist) Pollyann Savoy, MD  as Consulting Physician (Rheumatology) Audrie Gallus, RN as [facility] Care Management  Indicate any recent Medical Services you may have received from other than [facility] providers in the past year (date may be approximate).     Assessment:   This is a routine wellness examination for Kelli Ruiz.  Hearing/Vision screen Hearing Screening - Comments:: Denies hearing issues Vision Screening - Comments:: Regular eye exams, [facility]   Goals Addressed             This Visit's Progress    Patient Stated       02/28/2023, continue to lose weight       Depression Screen    02/28/2023   11:26 AM 10/21/2022   10:49 AM 07/30/2022   12:56 PM 07/19/2022   11:08 AM 04/13/2022   10:54 AM 02/04/2022    2:05 PM 11/23/2021    2:15 PM  PHQ 2/9 Scores  PHQ - 2 Score 0 2 0 0 0 0 0  PHQ- 9 Score  4  0 1  0    Fall Risk    02/28/2023   11:25 AM 02/21/2023    5:19 PM 10/21/2022   10:49 AM 07/30/2022    1:08 PM 07/19/2022   10:11 AM  Fall Risk   Falls in the past year? 0 0 0 0 0  Number falls in past yr: 0 1 0  0  Injury with Fall? 0 0 0  0  Risk for fall due to : Medication side effect Medication side effect     Follow up Falls prevention discussed;Falls evaluation completed Falls prevention discussed;Falls evaluation completed       MEDICARE RISK AT HOME: Medicare Risk at Home Any stairs in or around the home?: Yes If so, are there any without handrails?: No Home free of loose throw rugs in walkways, pet beds, electrical cords, etc?: Yes Adequate lighting in your home  to reduce risk of falls?: Yes Life alert?: No Use of a cane, walker or w/c?: No Grab bars in the bathroom?: No Shower chair or bench in shower?: No Elevated toilet seat or a handicapped toilet?: No  TIMED UP AND GO:  Was the test performed?  No    Cognitive Function:        02/28/2023   11:26 AM 03/09/2022    2:43 PM  6CIT Screen  What Year? 0 points 0 points  What month? 0 points 0 points   What time? 0 points 0 points  Count back from 20 0 points 0 points  Months in reverse 0 points 0 points  Repeat phrase 0 points 0 points  Total Score 0 points 0 points    Immunizations Immunization History  Administered Date(s) Administered   Fluad Trivalent(High Dose 65+) 01/11/2023   Influenza Inj Mdck Quad Pf 02/14/2017   Influenza Split 03/24/2015   PNEUMOCOCCAL CONJUGATE-20 01/11/2023    TDAP status: Up to date  Flu Vaccine status: Up to date  Pneumococcal vaccine status: Up to date  Covid-19 vaccine status: Information provided on how to obtain vaccines.   Qualifies for Shingles Vaccine? Yes   Zostavax completed No   Shingrix Completed?: No.    Education has been provided regarding the importance of this vaccine. Patient has been advised to call insurance company to determine out of pocket expense if they have not yet received this vaccine. Advised may also receive vaccine at local pharmacy or Health Dept. Verbalized acceptance and understanding.  Screening Tests Health Maintenance  Topic Date Due   COVID-19 Vaccine (1) Never done   Hepatitis C Screening  Never done   Colonoscopy  Never done   Zoster Vaccines- Shingrix (1 of 2) 05/31/2023 (Originally 04/02/1975)   Diabetic kidney evaluation - Urine ACR  04/14/2023   FOOT EXAM  04/14/2023   HEMOGLOBIN A1C  04/22/2023   Diabetic kidney evaluation - eGFR measurement  06/26/2023   OPHTHALMOLOGY EXAM  08/05/2023   MAMMOGRAM  11/01/2023   Medicare Annual Wellness (AWV)  02/28/2024   Pneumonia Vaccine 52+ Years old  Completed   INFLUENZA VACCINE  Completed   DEXA SCAN  Completed   HPV VACCINES  Aged Out   DTaP/Tdap/Td  Discontinued    Health Maintenance  Health Maintenance Due  Topic Date Due   COVID-19 Vaccine (1) Never done   Hepatitis C Screening  Never done   Colonoscopy  Never done    Colorectal cancer screening: says due in 2025  Mammogram status: Completed 11/01/2022. Repeat every year  Bone  Density status: Completed 10/29/2021.   Lung Cancer Screening: (Low Dose CT Chest recommended if Age 2-80 years, 20 pack-year currently smoking OR have quit w/in 15years.) does not qualify.   Lung Cancer Screening Referral: no  Additional Screening:  Hepatitis C Screening: does qualify;   Vision Screening: Recommended annual ophthalmology exams for early detection of glaucoma and other disorders of the eye. Is the patient up to date with their annual eye exam?  Yes  Who is the provider or what is the name of the office in which the patient attends annual eye exams? [facility] If pt is not established with a provider, would they like to be referred to a provider to establish care? No .   Dental Screening: Recommended annual dental exams for proper oral hygiene  Diabetic Foot Exam: Diabetic Foot Exam: Completed 04/13/2022  Community Resource Referral / Chronic Care Management: CRR required this  visit?  No   CCM required this visit?  No     Plan:     I have personally reviewed and noted the following in the patient's chart:   Medical and social history Use of alcohol, tobacco or illicit drugs  Current medications and supplements including opioid prescriptions. Patient is not currently taking opioid prescriptions. Functional ability and status Nutritional status Physical activity Advanced directives List of other physicians Hospitalizations, surgeries, and ER visits in previous 12 months Vitals Screenings to include cognitive, depression, and falls Referrals and appointments  In addition, I have reviewed and discussed with patient certain preventive protocols, quality metrics, and best practice recommendations. A written personalized care plan for preventive services as well as general preventive health recommendations were provided to patient.     Barb Merino, LPN   [id number]   After Visit Summary: (MyChart) Due to this being a telephonic visit, the after visit  summary with patients personalized plan was offered to patient via MyChart   Nurse Notes: none

## 2023-02-28 NOTE — Patient Instructions (Signed)
Kelli Ruiz , Thank you for taking time to come for your Medicare Wellness Visit. I appreciate your ongoing commitment to your health goals. Please review the following plan we discussed and let me know if I can assist you in the future.   Referrals/Orders/Follow-Ups/Clinician Recommendations: none  This is a list of the screening recommended for you and due dates:  Health Maintenance  Topic Date Due   COVID-19 Vaccine (1) Never done   Hepatitis C Screening  Never done   Colon Cancer Screening  Never done   Zoster (Shingles) Vaccine (1 of 2) 05/31/2023*   Yearly kidney health urinalysis for diabetes  04/14/2023   Complete foot exam   04/14/2023   Hemoglobin A1C  04/22/2023   Yearly kidney function blood test for diabetes  06/26/2023   Eye exam for diabetics  08/05/2023   Mammogram  11/01/2023   Medicare Annual Wellness Visit  02/28/2024   Pneumonia Vaccine  Completed   Flu Shot  Completed   DEXA scan (bone density measurement)  Completed   HPV Vaccine  Aged Out   DTaP/Tdap/Td vaccine  Discontinued  *Topic was postponed. The date shown is not the original due date.    Advanced directives: (ACP Link)Information on Advanced Care Planning can be found at [facility] Advance Health Care Directives Advance Health Care Directives ([email or web address]   Next Medicare Annual Wellness Visit scheduled for next year: Yes  Insert Preventive Care attachment Insert FALL PREVENTION attachment if needed

## 2023-03-04 ENCOUNTER — Ambulatory Visit: Payer: Medicare Other

## 2023-03-04 ENCOUNTER — Ambulatory Visit: Payer: Medicare Other | Attending: Rheumatology | Admitting: Rheumatology

## 2023-03-04 DIAGNOSIS — M79641 Pain in right hand: Secondary | ICD-10-CM

## 2023-03-04 DIAGNOSIS — M79642 Pain in left hand: Secondary | ICD-10-CM | POA: Diagnosis not present

## 2023-03-04 DIAGNOSIS — M0609 Rheumatoid arthritis without rheumatoid factor, multiple sites: Secondary | ICD-10-CM

## 2023-03-04 MED ORDER — HYDROXYCHLOROQUINE SULFATE 200 MG PO TABS: ORAL_TABLET | ORAL | 2 refills | Status: DC

## 2023-03-04 NOTE — Progress Notes (Signed)
Visit diagnoses: Pain in both hands, rheumatoid arthritis.  Ultrasound examination of bilateral hands was performed per EULAR recommendations. Using 15 MHz transducer, grayscale and power Doppler bilateral second and third MCP joints  both dorsal and volar aspects were evaluated to look for synovitis or tenosynovitis. The findings were there was  synovitis or  on ultrasound examination.  Impression: Synovitis was noted in bilateral second and third MCP joints on the ultrasound examination.  Ultrasound results were discussed with the patient.  She continues to have some pain and stiffness.  Bilateral MCP thickening and tenderness were noted.  I discussed resuming hydroxychloroquine.  Patient was given a prescription of hydroxychloroquine in February which she took for few days and then stopped.  She is ready to resume hydroxychloroquine.  At the time consent was obtained.  Side effects were again reviewed.  Patient will restart hydroxychloroquine 200 mg p.o. twice daily Monday to Friday.  She was advised to get labs in a month and then every 3 months to monitor for drug toxicity.  Will space the interval of the lab test if the labs are stable.  Pollyann Savoy, MD

## 2023-03-04 NOTE — Patient Instructions (Addendum)
Standing Labs We placed an order today for your standing lab work.   Please have your standing labs drawn in 1 month, 3 months and then every 5 months.   Please have your labs drawn 2 weeks prior to your appointment so that the provider can discuss your lab results at your appointment, if possible.  Please note that you may see your imaging and lab results in MyChart before we have reviewed them. We will contact you once all results are reviewed. Please allow our office up to 72 hours to thoroughly review all of the results before contacting the office for clarification of your results.  WALK-IN LAB HOURS  Monday through Thursday from 8:00 am -12:30 pm and 1:00 pm-5:00 pm and Friday from 8:00 am-12:00 pm.  Patients with office visits requiring labs will be seen before walk-in labs.  You may encounter longer than normal wait times. Please allow additional time. Wait times may be shorter on  Monday and Thursday afternoons.  We do not book appointments for walk-in labs. We appreciate your patience and understanding with our staff.   Labs are drawn by [facility]. Please bring your co-pay at the time of your lab draw.  You may receive a bill from [facility] for your lab work.  Please note if you are on Hydroxychloroquine and and an order has been placed for a Hydroxychloroquine level,  you will need to have it drawn 4 hours or more after your last dose.  If you wish to have your labs drawn at another location, please call the office 24 hours in advance so we can fax the orders.  The office is located at [address], [place], [place] [address]   If you have any questions regarding directions or hours of operation,  please call [phone].   As a reminder, please drink plenty of water prior to coming for your lab work. Thanks!     Hydroxychloroquine Tablets What is this medication? HYDROXYCHLOROQUINE (hye drox ee KLOR oh kwin) treats autoimmune conditions, such as rheumatoid  arthritis and lupus. It works by slowing down an overactive immune system. It may also be used to prevent and treat malaria. It works by killing the parasite that causes malaria. It belongs to a group of medications called DMARDs. This medicine may be used for other purposes; ask your health care provider or pharmacist if you have questions. COMMON BRAND NAME(S): Plaquenil, Quineprox, SOVUNA What should I tell my care team before I take this medication? They need to know if you have any of these conditions: Diabetes Eye disease, vision problems Frequently drink alcohol G6PD deficiency Heart disease Irregular heartbeat or rhythm Kidney disease Liver disease Porphyria Psoriasis An unusual or allergic reaction to hydroxychloroquine, other medications, foods, dyes, or preservatives Pregnant or trying to get pregnant Breastfeeding How should I use this medication? Take this medication by mouth with water. Take it as directed on the prescription label. Do not cut, crush, or chew this medication. Swallow the tablets whole. Take it with food. Do not take it more than directed. Take all of this medication unless your care team tells you to stop it early. Keep taking it even if you think you are better. Take products with antacids in them at a different time of day than this medication. Take this medication 4 hours before or 4 hours after antacids. Talk to your care team if you have questions. Talk to your care team about the use of this medication in children. While this  medication may be prescribed for selected conditions, precautions do apply. Overdosage: If you think you have taken too much of this medicine contact a poison control center or emergency room at once. NOTE: This medicine is only for you. Do not share this medicine with others. What if I miss a dose? If you miss a dose, take it as soon as you can. If it is almost time for your next dose, take only that dose. Do not take double or  extra doses. What may interact with this medication? Do not take this medication with any of the following: Cisapride Dronedarone Pimozide Thioridazine This medication may also interact with the following: Ampicillin Antacids Cimetidine Cyclosporine Digoxin Kaolin Medications for diabetes, such as insulin, glipizide, glyburide Medications for seizures, such as carbamazepine, phenobarbital, phenytoin Mefloquine Methotrexate Other medications that cause heart rhythm changes Praziquantel This list may not describe all possible interactions. Give your health care provider a list of all the medicines, herbs, non-prescription drugs, or dietary supplements you use. Also tell them if you smoke, drink alcohol, or use illegal drugs. Some items may interact with your medicine. What should I watch for while using this medication? Visit your care team for regular checks on your progress. Tell your care team if your symptoms do not start to get better or if they get worse. You may need blood work done while you are taking this medication. If you take other medications that can affect heart rhythm, you may need more testing. Talk to your care team if you have questions. Your vision may be tested before and during use of this medication. Tell your care team right away if you have any change in your eyesight. This medication may cause serious skin reactions. They can happen weeks to months after starting the medication. Contact your care team right away if you notice fevers or flu-like symptoms with a rash. The rash may be red or purple and then turn into blisters or peeling of the skin. Or, you might notice a red rash with swelling of the face, lips or lymph nodes in your neck or under your arms. If you or your family notice any changes in your behavior, such as new or worsening depression, thoughts of harming yourself, anxiety, or other unusual or disturbing thoughts, or memory loss, call your care team  right away. What side effects may I notice from receiving this medication? Side effects that you should report to your care team as soon as possible: Allergic reactions--skin rash, itching, hives, swelling of the face, lips, tongue, or throat Aplastic anemia--unusual weakness or fatigue, dizziness, headache, trouble breathing, increased bleeding or bruising Change in vision Heart rhythm changes--fast or irregular heartbeat, dizziness, feeling faint or lightheaded, chest pain, trouble breathing Infection--fever, chills, cough, or sore throat Low blood sugar (hypoglycemia)--tremors or shaking, anxiety, sweating, cold or clammy skin, confusion, dizziness, rapid heartbeat Muscle injury--unusual weakness or fatigue, muscle pain, dark yellow or brown urine, decrease in amount of urine Pain, tingling, or numbness in the hands or feet Rash, fever, and swollen lymph nodes Redness, blistering, peeling, or loosening of the skin, including inside the mouth Thoughts of suicide or self-harm, worsening mood, or feelings of depression Unusual bruising or bleeding Side effects that usually do not require medical attention (report to your care team if they continue or are bothersome): Diarrhea Headache Nausea Stomach pain Vomiting This list may not describe all possible side effects. Call your doctor for medical advice about side effects. You may report side effects to  FDA at 1-800-FDA-1088. Where should I keep my medication? Keep out of the reach of children and pets. Store at room temperature up to 30 degrees C (86 degrees F). Protect from light. Get rid of any unused medication after the expiration date. To get rid of medications that are no longer needed or have expired: Take the medication to a medication take-back program. Check with your pharmacy or law enforcement to find a location. If you cannot return the medication, check the label or package insert to see if the medication should be thrown out  in the garbage or flushed down the toilet. If you are not sure, ask your care team. If it is safe to put it in the trash, empty the medication out of the container. Mix the medication with cat litter, dirt, coffee grounds, or other unwanted substance. Seal the mixture in a bag or container. Put it in the trash. NOTE: This sheet is a summary. It may not cover all possible information. If you have questions about this medicine, talk to your doctor, pharmacist, or health care provider.  2024 Elsevier/Gold Standard (2021-11-09 00:00:00)

## 2023-03-07 ENCOUNTER — Ambulatory Visit: Payer: Medicare Other | Admitting: Dermatology

## 2023-03-09 ENCOUNTER — Other Ambulatory Visit: Payer: Self-pay

## 2023-03-09 DIAGNOSIS — E119 Type 2 diabetes mellitus without complications: Secondary | ICD-10-CM

## 2023-03-17 ENCOUNTER — Other Ambulatory Visit: Payer: Self-pay | Admitting: Family Medicine

## 2023-03-25 ENCOUNTER — Other Ambulatory Visit: Payer: Medicare Other | Admitting: *Deleted

## 2023-04-01 DIAGNOSIS — R300 Dysuria: Secondary | ICD-10-CM | POA: Diagnosis not present

## 2023-04-01 DIAGNOSIS — E119 Type 2 diabetes mellitus without complications: Secondary | ICD-10-CM | POA: Diagnosis not present

## 2023-04-13 ENCOUNTER — Other Ambulatory Visit: Payer: Self-pay

## 2023-04-13 NOTE — Patient Outreach (Signed)
Care Management   Visit Note  04/13/2023 Name: Kelli Ruiz MRN: [id number] DOB: 02/09/1956  Subjective: Kelli Ruiz is a 67 y.o. year old female who is a primary care patient of Kelli Gunner, MD. The Care Management team was consulted for assistance.      Engaged with patient spoke with patient by telephone.    Goals Addressed             This Visit's Progress    CCM (DIABETES) EXPECTED OUTCOME: MONITOR, SELF- MANAGE AND REDUCE SYMPTOMS OF DIABETES       Current Barriers:  Knowledge Deficits related to Diabetes management Chronic Disease Management support and education needs related to Diabetes, diet Patient reports she does not consistently check CBG, states she only checks if symptomatic for hypo or hyperglycemia and has not checked recently, states she plans to start checking daily and keeping a log Patient reports she follows Mediterranean diet, pt walks her dog daily for exercise Lab Results  Component Value Date   HGBA1C 7.3 (A) 10/21/2022     Planned Interventions: Reviewed medications with patient and discussed importance of medication adherence. The patient is getting medications through mail service now. Does have to cut the package open. Is following the instructions from the pcp;        Counseled on importance of regular laboratory monitoring as prescribed. Will have new labs in December;        Advised patient, providing education and rationale, to check cbg per doctor's order and record.    The patient states that she does not check her glucose on a regular basis. Encouraged the patient to check blood sugars on a more regular basis and especially if she feels different.     call provider for findings outside established parameters;       Review of patient status, including review of consultants reports, relevant laboratory and other test results, and medications completed;       Reinforced carbohydrate modified diet- is eating some Mediterranean diet, is  compliant with dietary restrictions Reviewed fasting blood sugar goals of 80-130 and less than 180 1 1/2-2 hours after meals  Symptom Management: Take medications as prescribed   Attend all scheduled provider appointments Call pharmacy for medication refills 3-7 days in advance of running out of medications Attend church or other social activities Perform all self care activities independently  Perform IADL's (shopping, preparing meals, housekeeping, managing finances) independently Call provider office for new concerns or questions  check blood sugar at prescribed times: per doctor's order  check feet daily for cuts, sores or redness enter blood sugar readings and medication or insulin into daily log take the blood sugar log to all doctor visits take the blood sugar meter to all doctor visits trim toenails straight across fill half of plate with vegetables limit fast food meals to no more than 1 per week manage portion size prepare main meal at home 3 to 5 days each week read food labels for fat, fiber, carbohydrates and portion size keep feet up while sitting Please check blood sugar consistently and keep a log Continue working with counselor for resources/ assistance related to caregiver stress  Follow Up Plan: Telephone follow up appointment with care management team member scheduled for:  06-14-2023 at 1145 am       CCM (HYPERTENSION) EXPECTED OUTCOME: MONITOR, SELF-MANAGE AND REDUCE SYMPTOMS OF HYPERTENSION       Current Barriers:  Knowledge Deficits related to Hypertension management Care Coordination needs  related to caregiver stress/ working with Child psychotherapist  in a patient with Hypertension Chronic Disease Management support and education needs related to Hypertension, diet No Advanced Directives in place- patient reports she has documents and plans to complete Patient has moved to [place], has set boundaries with her family Patient reports she is independent with all  aspects of her care, pt has blood pressure cuff but does not monitor blood pressure No new concerns reported  BP Readings from Last 3 Encounters:  02/08/23 111/71  01/11/23 126/78  10/21/22 130/76     Planned Interventions Reviewed low sodium diet- education for heart healthy/ADA diet Reviewed medications and importance of taking as prescribed- review of pharmacy support if needed for future needs Reinforced importance of continuing to exercise Reinforced importance of relaxation and daily self care Encouraged pt to monitor blood pressure  Symptom Management: Take medications as prescribed   Attend all scheduled provider appointments Call pharmacy for medication refills 3-7 days in advance of running out of medications Attend church or other social activities Perform all self care activities independently  Perform IADL's (shopping, preparing meals, housekeeping, managing finances) independently Call provider office for new concerns or questions  check blood pressure weekly choose a place to take my blood pressure (home, clinic or office, retail store) write blood pressure results in a log or diary learn about high blood pressure keep a blood pressure log take blood pressure log to all doctor appointments call doctor for signs and symptoms of high blood pressure keep all doctor appointments take medications for blood pressure exactly as prescribed report new symptoms to your doctor eat more whole grains, fruits and vegetables, lean meats and healthy fats Follow low sodium diet Limit fast food Practice relaxation and self care every day  Follow Up Plan: Telephone follow up appointment with care management team member scheduled for:   06-14-2023 at 1145 am           Consent to Services:  Patient was given information about care management services, agreed to services, and gave verbal consent to participate.   Plan: Telephone follow up appointment with care management team  member scheduled for: 06-14-2023 at 1145 am  Alto Denver RN, MSN, CCM RN Care Manager  [facility]  Ambulatory Care Management  Direct Number: [phone]

## 2023-04-13 NOTE — Patient Instructions (Signed)
Visit Information  Thank you for taking time to visit with me today. Please don't hesitate to contact me if I can be of assistance to you before our next scheduled telephone appointment.  Following are the goals we discussed today:   Goals Addressed             This Visit's Progress    CCM (DIABETES) EXPECTED OUTCOME: MONITOR, SELF- MANAGE AND REDUCE SYMPTOMS OF DIABETES       Current Barriers:  Knowledge Deficits related to Diabetes management Chronic Disease Management support and education needs related to Diabetes, diet Patient reports she does not consistently check CBG, states she only checks if symptomatic for hypo or hyperglycemia and has not checked recently, states she plans to start checking daily and keeping a log Patient reports she follows Mediterranean diet, pt walks her dog daily for exercise Lab Results  Component Value Date   HGBA1C 7.3 (A) 10/21/2022     Planned Interventions: Reviewed medications with patient and discussed importance of medication adherence. The patient is getting medications through mail service now. Does have to cut the package open. Is following the instructions from the pcp;        Counseled on importance of regular laboratory monitoring as prescribed. Will have new labs in December;        Advised patient, providing education and rationale, to check cbg per doctor's order and record.    The patient states that she does not check her glucose on a regular basis. Encouraged the patient to check blood sugars on a more regular basis and especially if she feels different.     call provider for findings outside established parameters;       Review of patient status, including review of consultants reports, relevant laboratory and other test results, and medications completed;       Reinforced carbohydrate modified diet- is eating some Mediterranean diet, is compliant with dietary restrictions Reviewed fasting blood sugar goals of 80-130 and less than  180 1 1/2-2 hours after meals  Symptom Management: Take medications as prescribed   Attend all scheduled provider appointments Call pharmacy for medication refills 3-7 days in advance of running out of medications Attend church or other social activities Perform all self care activities independently  Perform IADL's (shopping, preparing meals, housekeeping, managing finances) independently Call provider office for new concerns or questions  check blood sugar at prescribed times: per doctor's order  check feet daily for cuts, sores or redness enter blood sugar readings and medication or insulin into daily log take the blood sugar log to all doctor visits take the blood sugar meter to all doctor visits trim toenails straight across fill half of plate with vegetables limit fast food meals to no more than 1 per week manage portion size prepare main meal at home 3 to 5 days each week read food labels for fat, fiber, carbohydrates and portion size keep feet up while sitting Please check blood sugar consistently and keep a log Continue working with counselor for resources/ assistance related to caregiver stress  Follow Up Plan: Telephone follow up appointment with care management team member scheduled for:  06-14-2023 at 1145 am       CCM (HYPERTENSION) EXPECTED OUTCOME: MONITOR, SELF-MANAGE AND REDUCE SYMPTOMS OF HYPERTENSION       Current Barriers:  Knowledge Deficits related to Hypertension management Care Coordination needs related to caregiver stress/ working with social worker  in a patient with Hypertension Chronic Disease Management support and education  needs related to Hypertension, diet No Advanced Directives in place- patient reports she has documents and plans to complete Patient has moved to [facility], has set boundaries with her family Patient reports she is independent with all aspects of her care, pt has blood pressure cuff but does not monitor blood pressure No new  concerns reported  BP Readings from Last 3 Encounters:  02/08/23 111/71  01/11/23 126/78  10/21/22 130/76     Planned Interventions Reviewed low sodium diet- education for heart healthy/ADA diet Reviewed medications and importance of taking as prescribed- review of pharmacy support if needed for future needs Reinforced importance of continuing to exercise Reinforced importance of relaxation and daily self care Encouraged pt to monitor blood pressure  Symptom Management: Take medications as prescribed   Attend all scheduled provider appointments Call pharmacy for medication refills 3-7 days in advance of running out of medications Attend church or other social activities Perform all self care activities independently  Perform IADL's (shopping, preparing meals, housekeeping, managing finances) independently Call provider office for new concerns or questions  check blood pressure weekly choose a place to take my blood pressure (home, clinic or office, retail store) write blood pressure results in a log or diary learn about high blood pressure keep a blood pressure log take blood pressure log to all doctor appointments call doctor for signs and symptoms of high blood pressure keep all doctor appointments take medications for blood pressure exactly as prescribed report new symptoms to your doctor eat more whole grains, fruits and vegetables, lean meats and healthy fats Follow low sodium diet Limit fast food Practice relaxation and self care every day  Follow Up Plan: Telephone follow up appointment with care management team member scheduled for:   06-14-2023 at 1145 am           Our next appointment is by telephone on 06-14-2023 at 1145 am  Please call the care guide team at [phone] if you need to cancel or reschedule your appointment.   If you are experiencing a Mental Health or Behavioral Health Crisis or need someone to talk to, please call the Suicide and Crisis  Lifeline: 988 call the [place] National Suicide Prevention Lifeline: (580)605-9243 or TTY: (725) 532-8495 TTY (980)178-4815) to talk to a trained counselor call 1-800-273-TALK (toll free, 24 hour hotline) go to [facility] [address], [place] [phone])   Patient verbalizes understanding of instructions and care plan provided today and agrees to view in MyChart. Active MyChart status and patient understanding of how to access instructions and care plan via MyChart confirmed with patient.       Alto Denver RN, MSN, CCM RN Care Manager  [facility]  Ambulatory Care Management  Direct Number: [phone]

## 2023-04-18 NOTE — Patient Instructions (Signed)
Visit Information  Thank you for taking time to visit with me today. Please don't hesitate to contact me if I can be of assistance to you before our next scheduled telephone appointment.  Following are the goals we discussed today:   Goals Addressed             This Visit's Progress    CCM (DIABETES) EXPECTED OUTCOME: MONITOR, SELF- MANAGE AND REDUCE SYMPTOMS OF DIABETES       Current Barriers:  Knowledge Deficits related to Diabetes management Chronic Disease Management support and education needs related to Diabetes, diet Patient reports she does not consistently check CBG, states she only checks if symptomatic for hypo or hyperglycemia and has not checked recently, states she plans to start checking daily and keeping a log Patient reports she follows Mediterranean diet, pt walks her dog daily for exercise Lab Results  Component Value Date   HGBA1C 7.3 (A) 10/21/2022     Planned Interventions: Reviewed medications with patient and discussed importance of medication adherence. The patient is getting medications through mail service now. Does have to cut the package open. Is following the instructions from the pcp;        Counseled on importance of regular laboratory monitoring as prescribed. Will have new labs in December;        Advised patient, providing education and rationale, to check cbg per doctor's order and record.    The patient states that she does not check her glucose on a regular basis. Encouraged the patient to check blood sugars on a more regular basis and especially if she feels different.     call provider for findings outside established parameters;       Review of patient status, including review of consultants reports, relevant laboratory and other test results, and medications completed;       Reinforced carbohydrate modified diet- is eating some Mediterranean diet, is compliant with dietary restrictions Reviewed fasting blood sugar goals of 80-130 and less than  180 1 1/2-2 hours after meals  Symptom Management: Take medications as prescribed   Attend all scheduled provider appointments Call pharmacy for medication refills 3-7 days in advance of running out of medications Attend church or other social activities Perform all self care activities independently  Perform IADL's (shopping, preparing meals, housekeeping, managing finances) independently Call provider office for new concerns or questions  check blood sugar at prescribed times: per doctor's order  check feet daily for cuts, sores or redness enter blood sugar readings and medication or insulin into daily log take the blood sugar log to all doctor visits take the blood sugar meter to all doctor visits trim toenails straight across fill half of plate with vegetables limit fast food meals to no more than 1 per week manage portion size prepare main meal at home 3 to 5 days each week read food labels for fat, fiber, carbohydrates and portion size keep feet up while sitting Please check blood sugar consistently and keep a log Continue working with counselor for resources/ assistance related to caregiver stress  Follow Up Plan: Telephone follow up appointment with care management team member scheduled for:  06-14-2023 at 1145 am       CCM (HYPERTENSION) EXPECTED OUTCOME: MONITOR, SELF-MANAGE AND REDUCE SYMPTOMS OF HYPERTENSION       Current Barriers:  Knowledge Deficits related to Hypertension management Care Coordination needs related to caregiver stress/ working with social worker  in a patient with Hypertension Chronic Disease Management support and education  needs related to Hypertension, diet No Advanced Directives in place- patient reports she has documents and plans to complete Patient has moved to [facility], has set boundaries with her family Patient reports she is independent with all aspects of her care, pt has blood pressure cuff but does not monitor blood pressure No new  concerns reported  BP Readings from Last 3 Encounters:  02/08/23 111/71  01/11/23 126/78  10/21/22 130/76     Planned Interventions Reviewed low sodium diet- education for heart healthy/ADA diet Reviewed medications and importance of taking as prescribed- review of pharmacy support if needed for future needs Reinforced importance of continuing to exercise Reinforced importance of relaxation and daily self care Encouraged pt to monitor blood pressure  Symptom Management: Take medications as prescribed   Attend all scheduled provider appointments Call pharmacy for medication refills 3-7 days in advance of running out of medications Attend church or other social activities Perform all self care activities independently  Perform IADL's (shopping, preparing meals, housekeeping, managing finances) independently Call provider office for new concerns or questions  check blood pressure weekly choose a place to take my blood pressure (home, clinic or office, retail store) write blood pressure results in a log or diary learn about high blood pressure keep a blood pressure log take blood pressure log to all doctor appointments call doctor for signs and symptoms of high blood pressure keep all doctor appointments take medications for blood pressure exactly as prescribed report new symptoms to your doctor eat more whole grains, fruits and vegetables, lean meats and healthy fats Follow low sodium diet Limit fast food Practice relaxation and self care every day  Follow Up Plan: Telephone follow up appointment with care management team member scheduled for:   06-14-2023 at 1145 am         Our next appointment is by telephone on 06-14-2023 at 1145 am  Please call the care guide team at [phone] if you need to cancel or reschedule your appointment.   If you are experiencing a Mental Health or Behavioral Health Crisis or need someone to talk to, please call the Suicide and Crisis  Lifeline: 988 call the [place] National Suicide Prevention Lifeline: 601-017-3605 or TTY: (930)225-1356 TTY (559) 702-5261) to talk to a trained counselor call 1-800-273-TALK (toll free, 24 hour hotline) go to [facility] [address], [place] [phone])   Patient verbalizes understanding of instructions and care plan provided today and agrees to view in MyChart. Active MyChart status and patient understanding of how to access instructions and care plan via MyChart confirmed with patient.     Telephone follow up appointment with care management team member scheduled for: 06-14-2023 at 1145 am  Alto Denver RN, MSN, CCM RN Care Manager  [facility]  Ambulatory Care Management  Direct Number: [phone]

## 2023-04-18 NOTE — Patient Outreach (Signed)
Care Management   Visit Note  04/18/2023 Name: Kelli Ruiz MRN: [id number] DOB: 20-Jun-1955  Subjective: Kelli Ruiz is a 67 y.o. year old female who is a primary care patient of Kelli Gunner, MD. The Care Management team was consulted for assistance.      Engaged with patient spoke with patient by telephone.    Goals Addressed             This Visit's Progress    CCM (DIABETES) EXPECTED OUTCOME: MONITOR, SELF- MANAGE AND REDUCE SYMPTOMS OF DIABETES       Current Barriers:  Knowledge Deficits related to Diabetes management Chronic Disease Management support and education needs related to Diabetes, diet Patient reports she does not consistently check CBG, states she only checks if symptomatic for hypo or hyperglycemia and has not checked recently, states she plans to start checking daily and keeping a log Patient reports she follows Mediterranean diet, pt walks her dog daily for exercise Lab Results  Component Value Date   HGBA1C 7.3 (A) 10/21/2022     Planned Interventions: Reviewed medications with patient and discussed importance of medication adherence. The patient is getting medications through mail service now. Does have to cut the package open. Is following the instructions from the pcp;        Counseled on importance of regular laboratory monitoring as prescribed. Will have new labs in December;        Advised patient, providing education and rationale, to check cbg per doctor's order and record.    The patient states that she does not check her glucose on a regular basis. Encouraged the patient to check blood sugars on a more regular basis and especially if she feels different.     call provider for findings outside established parameters;       Review of patient status, including review of consultants reports, relevant laboratory and other test results, and medications completed;       Reinforced carbohydrate modified diet- is eating some Mediterranean diet, is  compliant with dietary restrictions Reviewed fasting blood sugar goals of 80-130 and less than 180 1 1/2-2 hours after meals  Symptom Management: Take medications as prescribed   Attend all scheduled provider appointments Call pharmacy for medication refills 3-7 days in advance of running out of medications Attend church or other social activities Perform all self care activities independently  Perform IADL's (shopping, preparing meals, housekeeping, managing finances) independently Call provider office for new concerns or questions  check blood sugar at prescribed times: per doctor's order  check feet daily for cuts, sores or redness enter blood sugar readings and medication or insulin into daily log take the blood sugar log to all doctor visits take the blood sugar meter to all doctor visits trim toenails straight across fill half of plate with vegetables limit fast food meals to no more than 1 per week manage portion size prepare main meal at home 3 to 5 days each week read food labels for fat, fiber, carbohydrates and portion size keep feet up while sitting Please check blood sugar consistently and keep a log Continue working with counselor for resources/ assistance related to caregiver stress  Follow Up Plan: Telephone follow up appointment with care management team member scheduled for:  06-14-2023 at 1145 am       CCM (HYPERTENSION) EXPECTED OUTCOME: MONITOR, SELF-MANAGE AND REDUCE SYMPTOMS OF HYPERTENSION       Current Barriers:  Knowledge Deficits related to Hypertension management Care Coordination needs  related to caregiver stress/ working with Child psychotherapist  in a patient with Hypertension Chronic Disease Management support and education needs related to Hypertension, diet No Advanced Directives in place- patient reports she has documents and plans to complete Patient has moved to [place], has set boundaries with her family Patient reports she is independent with all  aspects of her care, pt has blood pressure cuff but does not monitor blood pressure No new concerns reported  BP Readings from Last 3 Encounters:  02/08/23 111/71  01/11/23 126/78  10/21/22 130/76     Planned Interventions Reviewed low sodium diet- education for heart healthy/ADA diet Reviewed medications and importance of taking as prescribed- review of pharmacy support if needed for future needs Reinforced importance of continuing to exercise Reinforced importance of relaxation and daily self care Encouraged pt to monitor blood pressure  Symptom Management: Take medications as prescribed   Attend all scheduled provider appointments Call pharmacy for medication refills 3-7 days in advance of running out of medications Attend church or other social activities Perform all self care activities independently  Perform IADL's (shopping, preparing meals, housekeeping, managing finances) independently Call provider office for new concerns or questions  check blood pressure weekly choose a place to take my blood pressure (home, clinic or office, retail store) write blood pressure results in a log or diary learn about high blood pressure keep a blood pressure log take blood pressure log to all doctor appointments call doctor for signs and symptoms of high blood pressure keep all doctor appointments take medications for blood pressure exactly as prescribed report new symptoms to your doctor eat more whole grains, fruits and vegetables, lean meats and healthy fats Follow low sodium diet Limit fast food Practice relaxation and self care every day  Follow Up Plan: Telephone follow up appointment with care management team member scheduled for:   06-14-2023 at 1145 am          Consent to Services:  Patient was given information about care management services, agreed to services, and gave verbal consent to participate.   Plan: Telephone follow up appointment with care management team  member scheduled for: 06-14-2023 at 1145 am  Alto Denver RN, MSN, CCM RN Care Manager  [facility]  Ambulatory Care Management  Direct Number: [phone]

## 2023-05-16 ENCOUNTER — Ambulatory Visit: Payer: Medicare Other | Admitting: Family Medicine

## 2023-05-17 ENCOUNTER — Other Ambulatory Visit: Payer: Self-pay | Admitting: Rheumatology

## 2023-05-17 DIAGNOSIS — Z79899 Other long term (current) drug therapy: Secondary | ICD-10-CM

## 2023-05-17 DIAGNOSIS — E1169 Type 2 diabetes mellitus with other specified complication: Secondary | ICD-10-CM

## 2023-05-19 NOTE — Telephone Encounter (Signed)
 Last Fill: 03/04/2023  Eye exam: 08/05/2022 WNL    Labs: 06/25/2022 Glucose 114, Total Protein 8.6, RBC 5.57, Hgb 16.9, Hct 50.4  Next Visit: 06/14/2023  Last Visit: 02/08/2023  DX: Rheumatoid arthritis of multiple sites with negative rheumatoid factor   Current Dose per office note 03/04/2023: hydroxychloroquine  200 mg p.o. twice daily Monday to Friday.   Patient advised she is due to update her lab work.   Okay to refill Plaquenil ?

## 2023-05-24 ENCOUNTER — Ambulatory Visit: Payer: Medicare Other | Admitting: Family Medicine

## 2023-05-26 ENCOUNTER — Encounter: Payer: Medicare Other | Admitting: Family Medicine

## 2023-05-30 ENCOUNTER — Encounter: Payer: Self-pay | Admitting: Family Medicine

## 2023-05-30 ENCOUNTER — Ambulatory Visit (INDEPENDENT_AMBULATORY_CARE_PROVIDER_SITE_OTHER): Payer: Medicare Other | Admitting: Family Medicine

## 2023-05-30 VITALS — BP 120/74 | HR 90 | Temp 97.0°F | Wt 153.6 lb

## 2023-05-30 DIAGNOSIS — E119 Type 2 diabetes mellitus without complications: Secondary | ICD-10-CM | POA: Diagnosis not present

## 2023-05-30 DIAGNOSIS — R0989 Other specified symptoms and signs involving the circulatory and respiratory systems: Secondary | ICD-10-CM | POA: Insufficient documentation

## 2023-05-30 DIAGNOSIS — N952 Postmenopausal atrophic vaginitis: Secondary | ICD-10-CM | POA: Insufficient documentation

## 2023-05-30 DIAGNOSIS — M6284 Sarcopenia: Secondary | ICD-10-CM | POA: Diagnosis not present

## 2023-05-30 DIAGNOSIS — Z7984 Long term (current) use of oral hypoglycemic drugs: Secondary | ICD-10-CM | POA: Diagnosis not present

## 2023-05-30 LAB — POCT INFLUENZA A/B
Influenza A, POC: NEGATIVE
Influenza B, POC: NEGATIVE

## 2023-05-30 LAB — POC COVID19 BINAXNOW: SARS Coronavirus 2 Ag: NEGATIVE

## 2023-05-30 MED ORDER — DM-GUAIFENESIN ER 30-600 MG PO TB12: 1.0000 | ORAL_TABLET | Freq: Two times a day (BID) | ORAL | 0 refills | Status: DC | PRN

## 2023-05-30 MED ORDER — ESTROGENS CONJUGATED 0.625 MG/GM VA CREA: TOPICAL_CREAM | VAGINAL | 12 refills | Status: DC

## 2023-05-30 MED ORDER — ALBUTEROL SULFATE HFA 108 (90 BASE) MCG/ACT IN AERS: 2.0000 | INHALATION_SPRAY | Freq: Four times a day (QID) | RESPIRATORY_TRACT | 0 refills | Status: DC | PRN

## 2023-05-30 NOTE — Assessment & Plan Note (Signed)
 Recommend initiation of muscle-strengthening activities such as weight lifting or resistance training. Suggest exploring local options like the [employer] or home exercise programs. Continue protein supplementation with shakes to support muscle maintenance. Encourage regular physical activity to improve overall health.

## 2023-05-30 NOTE — Progress Notes (Signed)
 Assessment/Plan:   Problem List Items Addressed This Visit       Respiratory   Chest congestion - Primary   Likely Acute bronchitis secondary to viral URI  Plan:  Initiate albuterol  inhaler: 2 puffs every 4-6 hours as needed for wheezing. Recommend over-the-counter Mucinex  DM (guaifenesin /dextromethorphan) as directed for cough and mucus. Encourage increased fluid intake to aid mucus clearance. Advise rest and monitor symptoms.      Relevant Medications   albuterol  (VENTOLIN  HFA) 108 (90 Base) MCG/ACT inhaler   dextromethorphan-guaiFENesin  (MUCINEX  DM) 30-600 MG 12hr tablet   Other Relevant Orders   POC COVID-19 BinaxNow (Completed)   POCT Influenza A/B (Completed)     Endocrine   Diabetes mellitus (HCC)   Suboptimal control with medication side effects impacting adherence.  Plan:  Postpone medication changes until after resolution of acute illness. Plan comprehensive lab work (A1C, metabolic panel) once recovered from respiratory symptoms. Discuss potential alternative diabetes medications at the next visit. Encourage resuming regular blood sugar monitoring. Schedule follow-up appointment after recovery to reassess diabetes management.        Musculoskeletal and Integument   Sarcopenia   Recommend initiation of muscle-strengthening activities such as weight lifting or resistance training. Suggest exploring local options like the [employer] or home exercise programs. Continue protein supplementation with shakes to support muscle maintenance. Encourage regular physical activity to improve overall health.        Genitourinary   Vaginal atrophy   Ongoing vaginal discomfort characterized by rawness, redness, and burning in the vulvar area since November. Previous evaluations ruled out urinary tract infection, yeast infection, and bacterial vaginosis. Symptoms suggestive of atrophic vaginitis due to postmenopausal changes.  Plan:  Prescribe Premarin  (conjugated  estrogens ) vaginal cream 0.5 grams intravaginally once daily for 21 days, followed by 7 days off; repeat cycles as needed. Provide education on proper application technique. Continue sitz baths with Epsom salt for symptomatic relief. Reassess in 6 weeks to evaluate symptom improvement and adjust treatment if necessary.      Relevant Medications   conjugated estrogens  (PREMARIN ) vaginal cream    There are no discontinued medications.  Return in about 2 weeks (around 06/13/2023) for DM.    Subjective:   Encounter date: 05/30/2023  Kelli Ruiz is a 68 y.o. female who has S/P conversion right knee; Diabetes mellitus (HCC); Hypertension associated with diabetes (HCC); Hyperlipidemia associated with type 2 diabetes mellitus (HCC); Tremor; OSA (obstructive sleep apnea); Swelling of both hands; Excessive daytime sleepiness; OSA on CPAP; Non-restorative sleep; RLS (restless legs syndrome); Bilateral lower extremity edema; Vertigo; Caregiver stress; Ocular myokymia; Rheumatoid arthritis involving multiple sites [facility]); MDD (major depressive disorder); Urinary frequency; Multiple nevi; Chest congestion; Vaginal atrophy; and Sarcopenia on their problem list..   She  has a past medical history of Arthritis, Complication of anesthesia, Depression, Diabetes mellitus without complication (HCC), Hypertension, Neuromuscular disorder (HCC), Obese (01/19/2017), RLS (restless legs syndrome), Sleep apnea, and Vertigo..   Chief Complaint: Nasal congestion, wheezing, and productive cough for two weeks; ongoing vaginal discomfort; diabetes management discussion.  History of Present Illness:  Upper Respiratory Symptoms: Patient reports nasal congestion, right-sided wheezing, and coughing up mucus for the past two weeks. Noted coughing up a small amount of blood once. Denies chest pain and shortness of breath. Symptoms are unrelieved by CPAP use.  Vaginal Discomfort: Since November, experiencing rawness, redness,  and burning in the vulvar area. Previous evaluations ruled out infections. Symptoms persist despite using Aquaphor and Epsom salt baths. Believes symptoms may be  related to urine.  Diabetes Mellitus: Expresses intent to improve diabetes management. Reports that Farxiga  5 mg causes frequent urination, affecting daily activities. Interested in discussing alternative treatments or adjustments.  Weight and Muscle Mass: Patient notes weight loss and is satisfied but concerned about muscle mass loss. Not currently participating in strength-training exercises.  Review of Systems:  Constitutional: Denies fever or unintentional weight loss. Reports feeling generally well.  ENT: Positive for nasal congestion. Denies sore throat or ear pain.  Respiratory: Positive for wheezing (more on the right side), productive cough with mucus, occasional hemoptysis (small amount once). Denies shortness of breath or chest pain.  Cardiovascular: Denies chest pain, palpitations, or edema.  Gastrointestinal: Denies abdominal pain, nausea, or vomiting.  Genitourinary: Reports vaginal rawness, redness, and burning sensation. Denies dysuria or hematuria.  Musculoskeletal: Concerned about muscle mass loss. Denies joint pain or swelling.  Neurological: Denies headaches, dizziness, or weakness.  Psychiatric: Mood stable. Denies anxiety or depression.  Skin: Denies rashes or lesions.  Endocrine: Reports frequent urination associated with Farxiga .  All other systems reviewed and negative.  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;  Surgeon: Ernie Cough, MD;  Location: [facility];  Service: Orthopedics;  Laterality: Right;  90 mins with regional block   DILATION AND CURETTAGE OF UTERUS  12- 2014 or 2015   TONSILLECTOMY     unicompartmental knee   2016    Outpatient Medications Prior to Visit  Medication Sig Dispense Refill   amLODipine  (NORVASC ) 5 MG tablet Take 1 tablet (5 mg total) by mouth daily. 90 tablet 3   cetirizine  (ALLERGY, CETIRIZINE ,) 10 MG tablet Take 1 tablet (10 mg total) by mouth daily. 90 tablet 1   citalopram  (CELEXA ) 40 MG tablet TAKE 1 TABLET BY MOUTH ONCE DAILY 30 tablet 10   diclofenac  Sodium (VOLTAREN ) 1 % GEL Apply 4 g topically 4 (four) times daily as needed. 100 g 3   FARXIGA  5 MG TABS tablet Take 5 mg by mouth daily.     fluticasone  (FLONASE ) 50 MCG/ACT nasal spray Place 2 sprays into both nostrils daily as needed for allergies or rhinitis. 16 g 3   gabapentin  (NEURONTIN ) 600 MG tablet TAKE 1 TABLET BY MOUTH AT BREAKFAST AND AT BEDTIME 60 tablet 10   rosuvastatin  (CRESTOR ) 10 MG tablet TAKE ONE TABLET BY MOUTH EVERYDAY AT BEDTIME 90 tablet 2   ACCU-CHEK GUIDE test strip USE TO TEST BLOOD SUGAR ONCE DAILY AS DIRECTED 50 each 10   Accu-Chek Softclix Lancets lancets USE TO TEST BLOOD SUGAR DAILY AS DIRECTED 100 each 10   blood glucose meter kit and supplies Dispense based on patient and insurance preference. Use up to four times daily as directed. (FOR ICD-10 E10.9, E11.9). 1 each 0   hydroxychloroquine  (PLAQUENIL ) 200 MG tablet TAKE 1 TABLET BY MOUTH TWICE DAILY, MONDAY THROUGH FRIDAY. NONE ON SATURDAY OR SUNDAY. (Patient not taking: Reported on 05/30/2023) 40 tablet 0   meclizine  (ANTIVERT ) 12.5 MG tablet TAKE 1 TABLET BY MOUTH 3 TIMES DAILY AS NEEDED FOR DIZZINESS *REFILL REQUEST* (Patient not taking: Reported on 05/30/2023) 30 tablet 10   No facility-administered medications prior to visit.    Family History  Problem Relation Age of Onset   Diabetes Mother    Heart attack Mother    Kidney disease Mother    Arthritis Father  Stroke Father    Hypertension Father    Cancer Sister    Dementia Sister    Schizophrenia Sister    Breast cancer Neg Hx     Social History   Socioeconomic History    Marital status: Single    Spouse name: Not on file   Number of children: Not on file   Years of education: Not on file   Highest education level: Not on file  Occupational History   Not on file  Tobacco Use   Smoking status: Former    Current packs/day: 0.00    Types: Cigarettes    Start date: 01/09/1997    Quit date: 01/09/2017    Years since quitting: 6.3    Passive exposure: Past   Smokeless tobacco: Never   Tobacco comments:    Has stopped vaping now  Vaping Use   Vaping status: Former  Substance and Sexual Activity   Alcohol use: No   Drug use: No   Sexual activity: Not on file  Other Topics Concern   Not on file  Social History Narrative   Not on file   Social Drivers of Health   Financial Resource Strain: Low Risk  (02/28/2023)   Overall Financial Resource Strain (CARDIA)    Difficulty of Paying Living Expenses: Not hard at all  Food Insecurity: No Food Insecurity (02/28/2023)   Hunger Vital Sign    Worried About Running Out of Food in the Last Year: Never true    Ran Out of Food in the Last Year: Never true  Transportation Needs: No Transportation Needs (02/28/2023)   PRAPARE - Administrator, Civil Service (Medical): No    Lack of Transportation (Non-Medical): No  Physical Activity: Insufficiently Active (02/28/2023)   Exercise Vital Sign    Days of Exercise per Week: 7 days    Minutes of Exercise per Session: 20 min  Stress: No Stress Concern Present (02/28/2023)   [employer] of Occupational Health - Occupational Stress Questionnaire    Feeling of Stress : Not at all  Social Connections: Socially Isolated (02/28/2023)   Social Connection and Isolation Panel [NHANES]    Frequency of Communication with Friends and Family: Three times a week    Frequency of Social Gatherings with Friends and Family: Once a week    Attends Religious Services: Never    Database Administrator or Organizations: No    Attends Banker Meetings:  Never    Marital Status: Never married  Intimate Partner Violence: Not At Risk (02/28/2023)   Humiliation, Afraid, Rape, and Kick questionnaire    Fear of Current or Ex-Partner: No    Emotionally Abused: No    Physically Abused: No    Sexually Abused: No                                                                                                  Objective:  Physical Exam: BP 120/74   Pulse 90   Temp (!) 97 F (36.1 C) (Temporal)   Wt 153 lb 9.6 oz (69.7 kg)  SpO2 97%   BMI 26.37 kg/m   Wt Readings from Last 3 Encounters:  05/30/23 153 lb 9.6 oz (69.7 kg)  02/08/23 151 lb (68.5 kg)  01/11/23 154 lb 12.8 oz (70.2 kg)     Physical Exam Constitutional:      General: She is not in acute distress.    Appearance: Normal appearance. She is not ill-appearing or toxic-appearing.  HENT:     Head: Normocephalic and atraumatic.     Nose: Nose normal. No congestion.  Eyes:     General: No scleral icterus.    Extraocular Movements: Extraocular movements intact.  Cardiovascular:     Rate and Rhythm: Normal rate and regular rhythm.     Pulses: Normal pulses.     Heart sounds: Normal heart sounds.  Pulmonary:     Effort: Pulmonary effort is normal. No respiratory distress.     Breath sounds: Examination of the right-upper field reveals wheezing. Wheezing present.  Abdominal:     General: Abdomen is flat. Bowel sounds are normal.     Palpations: Abdomen is soft.  Musculoskeletal:        General: Normal range of motion.  Lymphadenopathy:     Cervical: No cervical adenopathy.  Skin:    General: Skin is warm and dry.     Findings: No rash.  Neurological:     General: No focal deficit present.     Mental Status: She is alert and oriented to person, place, and time. Mental status is at baseline.  Psychiatric:        Mood and Affect: Mood normal.        Behavior: Behavior normal.        Thought Content: Thought content normal.        Judgment: Judgment normal.     US   LIMITED JOINT SPACE STRUCTURES UP BILAT Result Date: 03/04/2023 Ultrasound examination of bilateral hands was performed per EULAR recommendations. Using 15 MHz transducer, grayscale and power Doppler bilateral second and third MCP joints  both dorsal and volar aspects were evaluated to look for synovitis or tenosynovitis. The findings were there was  synovitis or  on ultrasound examination. Impression: Synovitis was noted in bilateral second and third MCP joints on the ultrasound examination.   Recent Results (from the past 2160 hours)  POC COVID-19 BinaxNow     Status: None   Collection Time: 05/30/23  3:52 PM  Result Value Ref Range   SARS Coronavirus 2 Ag Negative Negative  POCT Influenza A/B     Status: None   Collection Time: 05/30/23  3:52 PM  Result Value Ref Range   Influenza A, POC Negative Negative   Influenza B, POC Negative Negative        Beverley Adine Hummer, MD, MS

## 2023-05-30 NOTE — Assessment & Plan Note (Signed)
 Likely Acute bronchitis secondary to viral URI  Plan:  Initiate albuterol  inhaler: 2 puffs every 4-6 hours as needed for wheezing. Recommend over-the-counter Mucinex  DM (guaifenesin /dextromethorphan) as directed for cough and mucus. Encourage increased fluid intake to aid mucus clearance. Advise rest and monitor symptoms.

## 2023-05-30 NOTE — Assessment & Plan Note (Signed)
 Suboptimal control with medication side effects impacting adherence.  Plan:  Postpone medication changes until after resolution of acute illness. Plan comprehensive lab work (A1C, metabolic panel) once recovered from respiratory symptoms. Discuss potential alternative diabetes medications at the next visit. Encourage resuming regular blood sugar monitoring. Schedule follow-up appointment after recovery to reassess diabetes management.

## 2023-05-30 NOTE — Assessment & Plan Note (Signed)
 Ongoing vaginal discomfort characterized by rawness, redness, and burning in the vulvar area since November. Previous evaluations ruled out urinary tract infection, yeast infection, and bacterial vaginosis. Symptoms suggestive of atrophic vaginitis due to postmenopausal changes.  Plan:  Prescribe Premarin  (conjugated estrogens ) vaginal cream 0.5 grams intravaginally once daily for 21 days, followed by 7 days off; repeat cycles as needed. Provide education on proper application technique. Continue sitz baths with Epsom salt for symptomatic relief. Reassess in 6 weeks to evaluate symptom improvement and adjust treatment if necessary.

## 2023-05-31 NOTE — Progress Notes (Deleted)
 Office Visit Note  Patient: Kelli Ruiz             Date of Birth: [birth year]           MRN: [id number]             PCP: [name], MD Referring: [name], MD Visit Date: 06/14/2023 Occupation: @GUAROCC @  Subjective:  No chief complaint on file.   History of Present Illness: Kelli Ruiz is a 68 y.o. female ***     Activities of Daily Living:  Patient reports morning stiffness for *** {minute/hour:19697}.   Patient {ACTIONS;DENIES/REPORTS:21021675::"Denies"} nocturnal pain.  Difficulty dressing/grooming: {ACTIONS;DENIES/REPORTS:21021675::"Denies"} Difficulty climbing stairs: {ACTIONS;DENIES/REPORTS:21021675::"Denies"} Difficulty getting out of chair: {ACTIONS;DENIES/REPORTS:21021675::"Denies"} Difficulty using hands for taps, buttons, cutlery, and/or writing: {ACTIONS;DENIES/REPORTS:21021675::"Denies"}  No Rheumatology ROS completed.   PMFS History:  Patient Active Problem List   Diagnosis Date Noted   Chest congestion 05/30/2023   Vaginal atrophy 05/30/2023   Sarcopenia 05/30/2023   MDD (major depressive disorder) 10/21/2022   Urinary frequency 10/21/2022   Multiple nevi 10/21/2022   Vertigo 07/19/2022   Caregiver stress 07/19/2022   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   Rheumatoid arthritis involving multiple sites (HCC) 07/19/2022   Bilateral lower extremity edema 04/18/2022   Excessive daytime sleepiness 01/25/2022   OSA on CPAP 01/25/2022   Non-restorative sleep 01/25/2022   RLS (restless legs syndrome) 01/25/2022   Diabetes mellitus (HCC) 11/23/2021   Hypertension associated with diabetes (HCC) 11/23/2021   Hyperlipidemia associated with type 2 diabetes mellitus (HCC) 11/23/2021   Tremor 11/23/2021   OSA (obstructive sleep apnea) 11/23/2021   Swelling of both hands 11/23/2021   S/P conversion right knee 01/18/2017    Past Medical History:  Diagnosis Date   Arthritis    Complication of anesthesia    " they had a little bit of a hard time  waking me after they took a nerve out of my leg"    Depression    Diabetes mellitus without complication (HCC)    Hypertension    Neuromuscular disorder (HCC)    Obese 01/19/2017   RLS (restless legs syndrome)    Sleep apnea    Vertigo     Family History  Problem Relation Age of Onset   Diabetes Mother    Heart attack Mother    Kidney disease Mother    Arthritis Father    Stroke Father    Hypertension Father    Cancer Sister    Dementia Sister    Schizophrenia Sister    Breast cancer Neg Hx    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;  Surgeon: Durene Romans, MD;  Location: [facility];  Service: Orthopedics;  Laterality: Right;  90 mins with regional block   DILATION AND CURETTAGE OF UTERUS  12- 2014 or 2015   TONSILLECTOMY     unicompartmental knee  2016   Social History   Social History Narrative   Not on file   Immunization History  Administered Date(s) Administered   Fluad Trivalent(High Dose 65+) 01/11/2023   Influenza Inj Mdck Quad Pf 02/14/2017   Influenza Split 03/24/2015   PNEUMOCOCCAL CONJUGATE-20 01/11/2023     Objective: Vital Signs: There were no vitals taken for this visit.   Physical Exam   Musculoskeletal Exam: ***  CDAI Exam: CDAI Score: -- Patient Global: --; Provider Global: --  Swollen: --; Tender: -- Joint Exam 06/14/2023   No joint exam has been documented for this visit   There is currently no information documented on the homunculus. Go to the Rheumatology activity and complete the homunculus joint exam.  Investigation: No additional findings.  Imaging: No results found.  Recent Labs: Lab Results  Component Value Date   WBC 6.5 06/25/2022   HGB 16.9 (H) 06/25/2022   PLT 267 06/25/2022   NA 139 06/25/2022   K 4.1 06/25/2022   CL 102 06/25/2022   CO2  26 06/25/2022   GLUCOSE 114 (H) 06/25/2022   BUN 15 06/25/2022   CREATININE 0.92 06/25/2022   BILITOT 0.8 06/25/2022   ALKPHOS 62 06/25/2022   AST 37 06/25/2022   ALT 42 06/25/2022   PROT 8.6 (H) 06/25/2022   ALBUMIN 4.5 06/25/2022   CALCIUM 9.7 06/25/2022   GFRAA >60 01/19/2017    Speciality Comments: PLQ Eye Exam: 08/05/2022 WNL @ [facility] Follow up in 1 year  Procedures:  No procedures performed Allergies: Bee pollen   Assessment / Plan:     Visit Diagnoses: No diagnosis found.  Orders: No orders of the defined types were placed in this encounter.  No orders of the defined types were placed in this encounter.   Face-to-face time spent with patient was *** minutes. Greater than 50% of time was spent in counseling and coordination of care.  Follow-Up Instructions: No follow-ups on file.   Pollyann Savoy, MD  Note - This record has been created using Animal nutritionist.  Chart creation errors have been sought, but may not always  have been located. Such creation errors do not reflect on  the standard of medical care.

## 2023-06-07 ENCOUNTER — Telehealth: Payer: Self-pay

## 2023-06-07 ENCOUNTER — Ambulatory Visit: Payer: Medicare Other | Admitting: Rheumatology

## 2023-06-07 NOTE — Telephone Encounter (Signed)
Patient contacted the office and states she had received a phone call regarding how she was due for labs. Patient states she has been off of her rheumatoid medication for a while and is doing okay. Patient states she does currently have the flu. Patient states she wants to talk to her PCP before continuing any RA medication. Please advise.

## 2023-06-07 NOTE — Telephone Encounter (Signed)
Attempted to contact the patient and left a message to call the office back. 

## 2023-06-07 NOTE — Telephone Encounter (Signed)
Dr. Corliss Skains can further discuss treatment options at upcoming visit scheduled 06/14/23

## 2023-06-08 DIAGNOSIS — Z1211 Encounter for screening for malignant neoplasm of colon: Secondary | ICD-10-CM | POA: Diagnosis not present

## 2023-06-08 DIAGNOSIS — J40 Bronchitis, not specified as acute or chronic: Secondary | ICD-10-CM | POA: Diagnosis not present

## 2023-06-08 DIAGNOSIS — J329 Chronic sinusitis, unspecified: Secondary | ICD-10-CM | POA: Diagnosis not present

## 2023-06-08 NOTE — Telephone Encounter (Signed)
Patient advised Dr. Corliss Skains can further discuss treatment options at upcoming visit scheduled 06/14/23. Patient states she will not make it to that appointment because she has an upper respiratory infection and is about to go to the urgent care. Patient states she is going to call the office back to reschedule.

## 2023-06-13 ENCOUNTER — Telehealth: Payer: Self-pay | Admitting: Adult Health

## 2023-06-13 LAB — COLOGUARD: COLOGUARD: POSITIVE — AB

## 2023-06-13 NOTE — Telephone Encounter (Signed)
[facility] left a voicemail on our phone stating the patient is requesting CPAP supplies. But they stated they need a written order, office notes and SS within the last 6 months.   Their phone number is [phone] & fax # [phone].

## 2023-06-13 NOTE — Telephone Encounter (Signed)
Orders were sent to [facility]. They are responsible for sending whatever is needed to get the pt supplies through [facility]

## 2023-06-14 ENCOUNTER — Telehealth: Payer: Self-pay | Admitting: Adult Health

## 2023-06-14 ENCOUNTER — Ambulatory Visit: Payer: Self-pay

## 2023-06-14 ENCOUNTER — Ambulatory Visit: Payer: Medicare Other | Admitting: Rheumatology

## 2023-06-14 ENCOUNTER — Encounter: Payer: Self-pay | Admitting: Family Medicine

## 2023-06-14 ENCOUNTER — Other Ambulatory Visit: Payer: Self-pay | Admitting: Family Medicine

## 2023-06-14 ENCOUNTER — Telehealth: Payer: Self-pay

## 2023-06-14 DIAGNOSIS — R600 Localized edema: Secondary | ICD-10-CM

## 2023-06-14 DIAGNOSIS — I152 Hypertension secondary to endocrine disorders: Secondary | ICD-10-CM

## 2023-06-14 DIAGNOSIS — E1169 Type 2 diabetes mellitus with other specified complication: Secondary | ICD-10-CM

## 2023-06-14 DIAGNOSIS — R195 Other fecal abnormalities: Secondary | ICD-10-CM

## 2023-06-14 DIAGNOSIS — E1165 Type 2 diabetes mellitus with hyperglycemia: Secondary | ICD-10-CM

## 2023-06-14 DIAGNOSIS — M5030 Other cervical disc degeneration, unspecified cervical region: Secondary | ICD-10-CM

## 2023-06-14 DIAGNOSIS — G2581 Restless legs syndrome: Secondary | ICD-10-CM

## 2023-06-14 DIAGNOSIS — M19071 Primary osteoarthritis, right ankle and foot: Secondary | ICD-10-CM

## 2023-06-14 DIAGNOSIS — M51369 Other intervertebral disc degeneration, lumbar region without mention of lumbar back pain or lower extremity pain: Secondary | ICD-10-CM

## 2023-06-14 DIAGNOSIS — G478 Other sleep disorders: Secondary | ICD-10-CM

## 2023-06-14 DIAGNOSIS — M0609 Rheumatoid arthritis without rheumatoid factor, multiple sites: Secondary | ICD-10-CM

## 2023-06-14 DIAGNOSIS — M79641 Pain in right hand: Secondary | ICD-10-CM

## 2023-06-14 DIAGNOSIS — G4733 Obstructive sleep apnea (adult) (pediatric): Secondary | ICD-10-CM

## 2023-06-14 DIAGNOSIS — Z96651 Presence of right artificial knee joint: Secondary | ICD-10-CM

## 2023-06-14 NOTE — Telephone Encounter (Signed)
Pt called  to verify appointment and to check if need to bring CPAP machine. Informed patient can bring CPAP and power cord just in case.

## 2023-06-14 NOTE — Patient Outreach (Signed)
  Care Coordination   Follow Up Visit Note   06/14/2023 Name: Kelli Ruiz MRN: [id number] DOB: [birth year]  Kelli Ruiz is a 68 y.o. year old female who sees [name], MD for primary care. I spoke with  [name] by phone today.  What matters to the patients health and wellness today?  GI referral due to abnormal cologard    Goals Addressed             This Visit's Progress    Health Maintenance-DM, HTN       Care Coordination Interventions: Provided education to patient about basic DM disease process Reviewed medications with patient and discussed importance of medication adherence Evaluation of current treatment plan related to hypertension self management and patient's adherence to plan as established by provider Provided education to patient re: stroke prevention, s/s of heart attack and stroke   Patient reports she is doing okay.  She says her cologard came back abnormal.  She has a referral to GI.  Discussed colonoscopy and prep. She verbalized understanding.  Last A1c 7.3.  Discussed diabetes and HTN Management.  No RN CM concerns.           SDOH assessments and interventions completed:  Yes  SDOH Interventions Today    Flowsheet Row Most Recent Value  SDOH Interventions   Health Literacy Interventions Intervention Not Indicated        Care Coordination Interventions:  Yes, provided   Follow up plan: Follow up call scheduled for March    Encounter Outcome:  Patient Visit Completed   [name] RN, MSN [facility], [facility] RN Care Manager Direct Dial: [phone]  Fax: [phone] Website: [email or web address]

## 2023-06-14 NOTE — Patient Outreach (Signed)
  Care Coordination   06/14/2023 Name: Kelli Ruiz MRN: [id number] DOB: 12-24-1955   Care Coordination Outreach Attempts:  An unsuccessful outreach was attempted for an appointment today.  Follow Up Plan:  Additional outreach attempts will be made to offer the patient complex care management information and services.   Encounter Outcome:  No Answer   Care Coordination Interventions:  No, not indicated    [name] RN, MSN [facility]  [facility], [facility] RN Care Manager Direct Dial: [phone]  Fax: [phone] Website: [email or web address]

## 2023-06-14 NOTE — Telephone Encounter (Signed)
[facility] had called and left a message on answering machine with our sleep lab dept.   "[facility] left a voicemail on our phone stating the patient is requesting CPAP supplies. But they stated they need a written order, office notes and SS within the last 6 months.    Their phone number is [phone] & fax # [phone]."  I sent a message to [facility] to have them verify if anything else is needed.  He responded  Pt has upcoming apt on 1/30. Once we have those visit notes and details from download. Then [facility] will be able to pull that information for [facility]

## 2023-06-14 NOTE — Patient Instructions (Signed)
Visit Information  Thank you for taking time to visit with me today. Please don't hesitate to contact me if I can be of assistance to you.   Following are the goals we discussed today:   Goals Addressed             This Visit's Progress    Health Maintenance-DM, HTN       Care Coordination Interventions: Provided education to patient about basic DM disease process Reviewed medications with patient and discussed importance of medication adherence Evaluation of current treatment plan related to hypertension self management and patient's adherence to plan as established by provider Provided education to patient re: stroke prevention, s/s of heart attack and stroke   Patient reports she is doing okay.  She says her cologard came back abnormal.  She has a referral to GI.  Discussed colonoscopy and prep. She verbalized understanding.  Last A1c 7.3.  Discussed diabetes and HTN Management.  No RN CM concerns.           Our next appointment is by telephone on 08/09/23 at 1130 am  Please call the care guide team at [phone] if you need to cancel or reschedule your appointment.   If you are experiencing a Mental Health or Behavioral Health Crisis or need someone to talk to, please call the Suicide and Crisis Lifeline: 988   Patient verbalizes understanding of instructions and care plan provided today and agrees to view in MyChart. Active MyChart status and patient understanding of how to access instructions and care plan via MyChart confirmed with patient.     The patient has been provided with contact information for the care management team and has been advised to call with any health related questions or concerns.   [name] RN, MSN [facility], [facility] RN Care Manager Direct Dial: [phone]  Fax: [phone] Website: [email or web address]

## 2023-06-15 NOTE — Progress Notes (Unsigned)
[facility] [address] [place]. [place] [address] [phone]       OFFICE FOLLOW UP NOTE  [name] Date of Birth:  [birth year] Medical Record Number:  [id number]   Reason for visit: Initial CPAP follow-up    SUBJECTIVE:   CHIEF COMPLAINT:  No chief complaint on file.   Follow-up visit:  Prior visit:  Brief HPI:   Kelli Ruiz is a 68 y.o. female who was evaluated by Dr. Vickey Huger in 01/2022 for transfer of care for OSA on CPAP.  She is initially diagnosed with apnea in 1990.  HST 02/2022 confirmed presence of severe sleep apnea with total AHI of 38.1/h and REM AHI 58.4/h.  Recommended use of auto CPAP, set up date 03/2022.  At prior visit, compliance report showed excellent usage and optimal residual AHI.  Continue current pressure settings.     Interval history:  Returns for yearly CPAP visit.  Compliance report shows continued excellent usage and optimal residual AHI.  Continues to tolerate CPAP well.  Reports continued benefit with CPAP.  Routinely follow with DME [employer] and is in need of new supplies.          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;  Surgeon: Durene Romans, MD;  Location: [facility];  Service: Orthopedics;  Laterality: Right;  90 mins with regional block    DILATION AND CURETTAGE OF UTERUS  12- 2014 or 2015   TONSILLECTOMY     unicompartmental knee  2016    Social History:  Social History   Socioeconomic History   Marital status: Single    Spouse name: Not on file   Number of children: Not on file   Years of education: Not on file   Highest education level: Not on file  Occupational History   Not on file  Tobacco Use   Smoking status: Former    Current packs/day: 0.00    Types: Cigarettes    Start date: 01/09/1997    Quit date: 01/09/2017    Years since quitting: 6.4    Passive exposure: Past   Smokeless tobacco: Never   Tobacco comments:    Has stopped vaping now  Vaping Use   Vaping status: Former  Substance and Sexual Activity   Alcohol use: No   Drug use: No   Sexual activity: Not on file  Other Topics Concern   Not on file  Social History Narrative   Not on file   Social Drivers of Health   Financial Resource Strain: Low Risk  (02/28/2023)   Overall Financial Resource Strain (CARDIA)    Difficulty of Paying Living Expenses: Not hard at all  Food Insecurity: Low Risk  (06/08/2023)  Received from [facility]   Hunger Vital Sign    Worried About Running Out of Food in the Last Year: Never true    Ran Out of Food in the Last Year: Never true  Transportation Needs: No Transportation Needs (06/08/2023)   Received from [facility]    In the past 12 months, has lack of reliable transportation kept you from medical appointments, meetings, work or from getting things needed for daily living? : No  Physical Activity: Insufficiently Active (02/28/2023)   Exercise Vital Sign    Days of Exercise per Week: 7 days    Minutes of Exercise per Session: 20 min  Stress: No Stress Concern Present (02/28/2023)   [employer] of Occupational Health - Occupational Stress Questionnaire    Feeling of Stress : Not at all  Social Connections: Socially Isolated (02/28/2023)   Social Connection and Isolation  Panel [NHANES]    Frequency of Communication with Friends and Family: Three times a week    Frequency of Social Gatherings with Friends and Family: Once a week    Attends Religious Services: Never    Database administrator or Organizations: No    Attends Banker Meetings: Never    Marital Status: Never married  Intimate Partner Violence: Not At Risk (02/28/2023)   Humiliation, Afraid, Rape, and Kick questionnaire    Fear of Current or Ex-Partner: No    Emotionally Abused: No    Physically Abused: No    Sexually Abused: No    Family History:  Family History  Problem Relation Age of Onset   Diabetes Mother    Heart attack Mother    Kidney disease Mother    Arthritis Father    Stroke Father    Hypertension Father    Cancer Sister    Dementia Sister    Schizophrenia Sister    Breast cancer Neg Hx     Medications:   Current Outpatient Medications on File Prior to Visit  Medication Sig Dispense Refill   ACCU-CHEK GUIDE test strip USE TO TEST BLOOD SUGAR ONCE DAILY AS DIRECTED 50 each 10   Accu-Chek Softclix Lancets lancets USE TO TEST BLOOD SUGAR DAILY AS DIRECTED 100 each 10   albuterol (VENTOLIN HFA) 108 (90 Base) MCG/ACT inhaler Inhale 2 puffs into the lungs every 6 (six) hours as needed for wheezing or shortness of breath. 8 g 0   amLODipine (NORVASC) 5 MG tablet Take 1 tablet (5 mg total) by mouth daily. 90 tablet 3   blood glucose meter kit and supplies Dispense based on patient and insurance preference. Use up to four times daily as directed. (FOR ICD-10 E10.9, E11.9). 1 each 0   cetirizine (ALLERGY, CETIRIZINE,) 10 MG tablet Take 1 tablet (10 mg total) by mouth daily. 90 tablet 1   citalopram (CELEXA) 40 MG tablet TAKE 1 TABLET BY MOUTH ONCE DAILY 30 tablet 10   conjugated estrogens (PREMARIN) vaginal cream Apply 21 days. Take 7 days off. 30 g 12   dextromethorphan-guaiFENesin (MUCINEX DM) 30-600 MG 12hr tablet Take 1-2 tablets by mouth 2 (two) times daily as  needed for cough. 120 tablet 0   diclofenac Sodium (VOLTAREN) 1 % GEL Apply 4 g topically 4 (four) times daily as needed. 100 g 3   FARXIGA 5 MG TABS tablet Take 5 mg by mouth daily.     fluticasone (FLONASE) 50 MCG/ACT nasal spray Place 2 sprays into both nostrils daily as needed for allergies or rhinitis.  16 g 3   gabapentin (NEURONTIN) 600 MG tablet TAKE 1 TABLET BY MOUTH AT BREAKFAST AND AT BEDTIME 60 tablet 10   hydroxychloroquine (PLAQUENIL) 200 MG tablet TAKE 1 TABLET BY MOUTH TWICE DAILY, MONDAY THROUGH FRIDAY. NONE ON SATURDAY OR SUNDAY. (Patient not taking: Reported on 05/30/2023) 40 tablet 0   meclizine (ANTIVERT) 12.5 MG tablet TAKE 1 TABLET BY MOUTH 3 TIMES DAILY AS NEEDED FOR DIZZINESS *REFILL REQUEST* (Patient not taking: Reported on 05/30/2023) 30 tablet 10   rosuvastatin (CRESTOR) 10 MG tablet TAKE ONE TABLET BY MOUTH EVERYDAY AT BEDTIME 90 tablet 2   No current facility-administered medications on file prior to visit.    Allergies:   Allergies  Allergen Reactions   Bee Pollen       OBJECTIVE:  Physical Exam  There were no vitals filed for this visit. There is no height or weight on file to calculate BMI. No results found.   General: well developed, well nourished, seated, in no evident distress Head: head normocephalic and atraumatic.   Neck: supple with no carotid or supraclavicular bruits Cardiovascular: regular rate and rhythm, no murmurs Musculoskeletal: no deformity Skin:  no rash/petichiae Vascular:  Normal pulses all extremities   Neurologic Exam Mental Status: Awake and fully alert. Oriented to place and time. Recent and remote memory intact. Attention span, concentration and fund of knowledge appropriate. Mood and affect appropriate.  Cranial Nerves: Pupils equal, briskly reactive to light. Extraocular movements full without nystagmus. Visual fields full to confrontation. Hearing intact. Facial sensation intact. Face, tongue, palate moves normally and  symmetrically.  Motor: Normal bulk and tone. Normal strength in all tested extremity muscles Gait and Station: Arises from chair without difficulty. Stance is normal. Gait demonstrates normal stride length and balance without use of AD.          ASSESSMENT/PLAN: Kelli Ruiz is a 68 y.o. year old female    OSA on CPAP : Compliance report shows satisfactory usage with optimal residual AHI.  Continue current pressure settings of 5-20 with EPR 2.  Discussed continued nightly usage with ensuring greater than 4 hours nightly for optimal benefit and per insurance purposes.  Continue to follow with DME company for any needed supplies or CPAP related concerns     Follow up in *** or call earlier if needed   CC:  PCP: [name], MD    I spent *** minutes of face-to-face and non-face-to-face time with patient.  This included previsit chart review, lab review, study review, order entry, electronic health record documentation, patient education and discussion regarding above diagnoses and treatment plan and answered all other questions to patient's satisfaction    Ihor Austin, [facility]  [facility] [address] [place], [place] [address]  Phone [phone] Fax [phone] Note: This document was prepared with digital dictation and possible smart phrase technology. Any transcriptional errors that result from this process are unintentional.

## 2023-06-16 ENCOUNTER — Encounter: Payer: Self-pay | Admitting: Adult Health

## 2023-06-16 ENCOUNTER — Ambulatory Visit: Payer: Medicare Other | Admitting: Adult Health

## 2023-06-16 VITALS — BP 112/62 | HR 77 | Ht 70.0 in | Wt 152.2 lb

## 2023-06-16 DIAGNOSIS — G4733 Obstructive sleep apnea (adult) (pediatric): Secondary | ICD-10-CM | POA: Diagnosis not present

## 2023-06-16 NOTE — Patient Instructions (Addendum)
Your Plan:  Continue nightly use of CPAP for adequate sleep apnea management  Continue to follow with your DME for any needs supplies or CPAP related concerns    Follow-up in 1 year or call earlier if needed     Thank you for coming to see [place] at [facility]. I hope we have been able to provide you high quality care today.  You may receive a patient satisfaction survey over the next few weeks. We would appreciate your feedback and comments so that we may continue to improve ourselves and the health of our patients.

## 2023-06-17 ENCOUNTER — Other Ambulatory Visit: Payer: Self-pay | Admitting: Family Medicine

## 2023-06-17 DIAGNOSIS — E1169 Type 2 diabetes mellitus with other specified complication: Secondary | ICD-10-CM

## 2023-06-21 ENCOUNTER — Ambulatory Visit: Payer: Medicare Other | Admitting: Family Medicine

## 2023-06-23 ENCOUNTER — Encounter: Payer: Medicare Other | Admitting: Family Medicine

## 2023-07-05 ENCOUNTER — Other Ambulatory Visit: Payer: Medicare Other

## 2023-07-05 ENCOUNTER — Ambulatory Visit: Payer: Medicare Other | Admitting: Family Medicine

## 2023-07-12 ENCOUNTER — Other Ambulatory Visit: Payer: Medicare Other

## 2023-07-12 ENCOUNTER — Ambulatory Visit: Payer: Medicare Other | Admitting: Family Medicine

## 2023-07-14 ENCOUNTER — Encounter: Payer: Self-pay | Admitting: Family Medicine

## 2023-07-14 ENCOUNTER — Ambulatory Visit (INDEPENDENT_AMBULATORY_CARE_PROVIDER_SITE_OTHER): Payer: Medicare Other | Admitting: Family Medicine

## 2023-07-14 ENCOUNTER — Other Ambulatory Visit: Payer: Medicare Other

## 2023-07-14 VITALS — BP 124/78 | HR 67 | Temp 97.0°F | Wt 155.2 lb

## 2023-07-14 DIAGNOSIS — K5904 Chronic idiopathic constipation: Secondary | ICD-10-CM | POA: Insufficient documentation

## 2023-07-14 DIAGNOSIS — I152 Hypertension secondary to endocrine disorders: Secondary | ICD-10-CM | POA: Diagnosis not present

## 2023-07-14 DIAGNOSIS — Z7984 Long term (current) use of oral hypoglycemic drugs: Secondary | ICD-10-CM | POA: Diagnosis not present

## 2023-07-14 DIAGNOSIS — N952 Postmenopausal atrophic vaginitis: Secondary | ICD-10-CM | POA: Diagnosis not present

## 2023-07-14 DIAGNOSIS — M0579 Rheumatoid arthritis with rheumatoid factor of multiple sites without organ or systems involvement: Secondary | ICD-10-CM

## 2023-07-14 DIAGNOSIS — E785 Hyperlipidemia, unspecified: Secondary | ICD-10-CM

## 2023-07-14 DIAGNOSIS — E119 Type 2 diabetes mellitus without complications: Secondary | ICD-10-CM

## 2023-07-14 DIAGNOSIS — E1159 Type 2 diabetes mellitus with other circulatory complications: Secondary | ICD-10-CM | POA: Diagnosis not present

## 2023-07-14 DIAGNOSIS — Z1159 Encounter for screening for other viral diseases: Secondary | ICD-10-CM | POA: Diagnosis not present

## 2023-07-14 DIAGNOSIS — R195 Other fecal abnormalities: Secondary | ICD-10-CM | POA: Diagnosis not present

## 2023-07-14 DIAGNOSIS — E1169 Type 2 diabetes mellitus with other specified complication: Secondary | ICD-10-CM

## 2023-07-14 LAB — POCT GLYCOSYLATED HEMOGLOBIN (HGB A1C): Hemoglobin A1C: 8.1 % — AB (ref 4.0–5.6)

## 2023-07-14 LAB — URINALYSIS, ROUTINE W REFLEX MICROSCOPIC
Bilirubin Urine: NEGATIVE
Ketones, ur: NEGATIVE
Leukocytes,Ua: NEGATIVE
Nitrite: NEGATIVE
Specific Gravity, Urine: 1.015 (ref 1.000–1.030)
Total Protein, Urine: NEGATIVE
Urine Glucose: >=1000 — AB
Urobilinogen, UA: 0.2 (ref 0.0–1.0)
pH: 6 (ref 5.0–8.0)

## 2023-07-14 LAB — CBC WITH DIFFERENTIAL/PLATELET
Basophils Absolute: 0.1 10*3/uL (ref 0.0–0.1)
Basophils Relative: 1.2 % (ref 0.0–3.0)
Eosinophils Absolute: 0.2 10*3/uL (ref 0.0–0.7)
Eosinophils Relative: 3.6 % (ref 0.0–5.0)
HCT: 46.8 % — ABNORMAL HIGH (ref 36.0–46.0)
Hemoglobin: 15.5 g/dL — ABNORMAL HIGH (ref 12.0–15.0)
Lymphocytes Relative: 48.2 % — ABNORMAL HIGH (ref 12.0–46.0)
Lymphs Abs: 3 10*3/uL (ref 0.7–4.0)
MCHC: 33.1 g/dL (ref 30.0–36.0)
MCV: 89.9 fL (ref 78.0–100.0)
Monocytes Absolute: 0.4 10*3/uL (ref 0.1–1.0)
Monocytes Relative: 6.9 % (ref 3.0–12.0)
Neutro Abs: 2.5 10*3/uL (ref 1.4–7.7)
Neutrophils Relative %: 40.1 % — ABNORMAL LOW (ref 43.0–77.0)
Platelets: 70 10*3/uL — ABNORMAL LOW (ref 150.0–400.0)
RBC: 5.2 Mil/uL — ABNORMAL HIGH (ref 3.87–5.11)
RDW: 14.3 % (ref 11.5–15.5)
WBC: 6.3 10*3/uL (ref 4.0–10.5)

## 2023-07-14 LAB — COMPREHENSIVE METABOLIC PANEL
ALT: 38 U/L — ABNORMAL HIGH (ref 0–35)
AST: 28 U/L (ref 0–37)
Albumin: 4.3 g/dL (ref 3.5–5.2)
Alkaline Phosphatase: 60 U/L (ref 39–117)
BUN: 12 mg/dL (ref 6–23)
CO2: 27 meq/L (ref 19–32)
Calcium: 9.5 mg/dL (ref 8.4–10.5)
Chloride: 103 meq/L (ref 96–112)
Creatinine, Ser: 0.77 mg/dL (ref 0.40–1.20)
GFR: 79.34 mL/min (ref >60.00)
Glucose, Bld: 151 mg/dL — ABNORMAL HIGH (ref 70–99)
Potassium: 4 meq/L (ref 3.5–5.1)
Sodium: 137 meq/L (ref 135–145)
Total Bilirubin: 0.6 mg/dL (ref 0.2–1.2)
Total Protein: 7.6 g/dL (ref 6.0–8.3)

## 2023-07-14 LAB — LIPID PANEL
Cholesterol: 129 mg/dL (ref 0–200)
HDL: 44.5 mg/dL (ref >39.00)
LDL Cholesterol: 64 mg/dL (ref 0–99)
NonHDL: 84.2
Total CHOL/HDL Ratio: 3
Triglycerides: 99 mg/dL (ref 0.0–149.0)
VLDL: 19.8 mg/dL (ref 0.0–40.0)

## 2023-07-14 LAB — C-REACTIVE PROTEIN: CRP: <1 mg/dL (ref 0.5–20.0)

## 2023-07-14 LAB — MICROALBUMIN / CREATININE URINE RATIO
Creatinine,U: 61.6 mg/dL
Microalb Creat Ratio: 11.4 mg/g (ref 0.0–30.0)
Microalb, Ur: <0.7 mg/dL (ref 0.0–1.9)

## 2023-07-14 LAB — SEDIMENTATION RATE: Sed Rate: 21 mm/h (ref 0–30)

## 2023-07-14 MED ORDER — PIOGLITAZONE HCL 15 MG PO TABS: 15.0000 mg | ORAL_TABLET | Freq: Every day | ORAL | 3 refills | Status: DC

## 2023-07-14 MED ORDER — POLYETHYLENE GLYCOL 3350 17 GM/SCOOP PO POWD: 17.0000 g | Freq: Every day | ORAL | 5 refills | Status: AC

## 2023-07-14 NOTE — Progress Notes (Signed)
 Assessment/Plan:   Type 2 Diabetes Mellitus Hemoglobin A1c is 8.1%, indicating suboptimal control. Reports increased thirst and frequent urination. Current medications include Farxiga 5mg , but experienced adverse effects at higher doses. Discussed potential benefits of GLP-1 agonists like Ozempic or Mounjaro, which provide cardiac protection and fewer side effects. - Continue Farxiga 5mg  - Start pioglitazone 15 mg daily - Refer to pharmacy team for GLP-1 agonists cost assistance - Check fasting blood sugars - Order urine microalbumin/creatinine ratio - Recheck kidney function, electrolytes, liver function, and thyroid function  Constipation/+ Cologuard Chronic constipation since August, with severe episodes requiring suppositories. Using stool softeners with some relief. Scheduled for a colonoscopy due to a positive Cologuard test. Discussed dietary and medication causes, and the need to rule out colon cancer. - Start polyethylene glycol (Miralax) one capful daily - Follow up with gastroenterology for colonoscopy on March 7th  Vaginal Atrophy Symptoms of vaginal irritation and tightness, likely due to vaginal atrophy.  Vaginal estrogen cost prohibitive. Discussed alternative treatments and the need for OB/GYN evaluation. - Refer to OB/GYN for further evaluation and management - Discuss alternative, more affordable treatments  Rheumatoid Arthritis Nail issues possibly related to rheumatoid arthritis. Not currently taking Plaquenil. Discussed management by podiatry for feet and dermatology for hands. - Follow up with rheumatology for ongoing management -Check CBC, ESR, CRP  Hyperlipidemia Routine follow-up for hyperlipidemia.  On rosuvastatin 10 mg.  On amlodipine 5 mg for hypertension. - Check cholesterol and liver function -Continue rosuvastatin 10 mg daily  Hypertension Blood pressure well-controlled today.  On amlodipine 5 mg. -Continue amlodipine 5 mg lab work to assess renal  function.   General Health Maintenance Routine health maintenance and screenings. - Screen for hepatitis C - Check blood counts, ESR, and CRP  Follow-up - Follow up in 3 months - Coordinate with pharmacy team for medication assistance - Ensure OB/GYN and gastroenterology follow-ups are completed.         Medications Discontinued During This Encounter  Medication Reason   dextromethorphan-guaiFENesin (MUCINEX DM) 30-600 MG 12hr tablet    conjugated estrogens (PREMARIN) vaginal cream    meclizine (ANTIVERT) 12.5 MG tablet     Return in about 3 months (around 10/11/2023) for BP, DM, HLD.    Subjective:   Encounter date: 07/14/2023  Kelli Ruiz is a 68 y.o. female who has S/P conversion right knee; Diabetes mellitus (HCC); Hypertension associated with diabetes (HCC); Hyperlipidemia associated with type 2 diabetes mellitus (HCC); Tremor; OSA (obstructive sleep apnea); Swelling of both hands; Excessive daytime sleepiness; OSA on CPAP; Non-restorative sleep; RLS (restless legs syndrome); Bilateral lower extremity edema; Vertigo; Caregiver stress; Ocular myokymia; Rheumatoid arthritis involving multiple sites [facility]); MDD (major depressive disorder); Urinary frequency; Multiple nevi; Chest congestion; Vaginal atrophy; Sarcopenia; Positive colorectal cancer screening using Cologuard test; and Chronic idiopathic constipation on their problem list..   She  has a past medical history of Arthritis, Complication of anesthesia, Depression, Diabetes mellitus without complication (HCC), Hypertension, Neuromuscular disorder (HCC), Obese (01/19/2017), RLS (restless legs syndrome), Sleep apnea, and Vertigo..   She presents with chief complaint of Medical Management of Chronic Issues (DM follow up. Fasting. Estrogen cream was $400 so pt didn't start. ) .   Discussed the use of AI scribe software for clinical note transcription with the patient, who gave verbal consent to proceed.  History of  Present Illness   Kelli Ruiz is a 68 year old female with diabetes who presents for follow-up and fasting blood work.  She experiences increased  thirst and frequent urination, which complicates her diabetes management, particularly in social settings. She has been on insulin previously and is currently taking [place]. A higher dose of Farxiga exacerbated her urination. She is contemplating resuming insulin due to her symptoms.  She has been dealing with chronic constipation since August of last year, severe enough to require her neighbor's assistance to obtain suppositories at night. Her stools are hard, and she has been using suppositories and Colace with some relief. She has also used Miralax in the past and is increasing her fluid intake to alleviate constipation. She has a positive Cologuard test and is scheduled for a colonoscopy on March 7th. No blood in her stool, but she continues to experience hard stools.  She experiences vaginal irritation and uses a cream starting with 'A' for relief. She finds Premarin cream too expensive and has not consulted an OB GYN recently. Her symptoms include tightness and irritation, consistent with vaginal atrophy.  Her fingernails chip and break easily, which she attributes to a possible vitamin deficiency or her rheumatoid arthritis. She is not currently taking Plaquenil for her rheumatoid arthritis.       Review of Systems  Constitutional:  Negative for chills, diaphoresis, fever, malaise/fatigue and weight loss.  HENT:  Negative for congestion, ear discharge, ear pain and hearing loss.   Eyes:  Negative for blurred vision, double vision, photophobia, pain, discharge and redness.  Respiratory:  Negative for cough, sputum production, shortness of breath and wheezing.   Cardiovascular:  Negative for chest pain and palpitations.  Gastrointestinal:  Positive for constipation. Negative for abdominal pain, blood in stool, diarrhea, heartburn, melena, nausea  and vomiting.  Genitourinary:  Positive for frequency and urgency. Negative for dysuria, flank pain and hematuria.  Musculoskeletal:  Negative for myalgias.  Skin:  Negative for itching and rash.  Neurological:  Negative for dizziness, tingling, tremors, speech change, seizures, loss of consciousness, weakness and headaches.  Endo/Heme/Allergies:  Positive for polydipsia.  All other systems reviewed and are negative.   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;  Surgeon: Durene Romans, MD;  Location: [facility];  Service: Orthopedics;  Laterality: Right;  90 mins with regional block   DILATION AND CURETTAGE OF UTERUS  12- 2014 or 2015   TONSILLECTOMY     unicompartmental knee  2016    Outpatient Medications Prior to Visit  Medication Sig Dispense Refill   ACCU-CHEK GUIDE test strip USE TO TEST BLOOD SUGAR ONCE DAILY AS DIRECTED 50 each 10   Accu-Chek Softclix Lancets lancets USE TO TEST BLOOD SUGAR DAILY AS DIRECTED 100 each 10   albuterol (VENTOLIN HFA) 108 (90 Base) MCG/ACT inhaler Inhale 2 puffs into the lungs every 6 (six) hours as needed for wheezing or shortness of breath. 8 g 0   amLODipine (NORVASC) 5 MG tablet Take 1 tablet (5 mg total) by mouth daily. 90 tablet 3   blood glucose meter kit and supplies Dispense based on patient and insurance preference. Use up to four times daily as directed. (FOR ICD-10 E10.9, E11.9). 1 each 0   cetirizine (ALLERGY, CETIRIZINE,) 10 MG tablet Take 1 tablet (10 mg total) by mouth daily. 90 tablet 1   citalopram (CELEXA) 40 MG tablet TAKE 1 TABLET BY MOUTH ONCE DAILY 30 tablet 10   diclofenac Sodium (VOLTAREN) 1 % GEL Apply 4 g  topically 4 (four) times daily as needed. 100 g 3   FARXIGA 5 MG TABS tablet Take 5 mg by mouth daily.     fluticasone (FLONASE) 50 MCG/ACT nasal spray  Place 2 sprays into both nostrils daily as needed for allergies or rhinitis. 16 g 3   gabapentin (NEURONTIN) 600 MG tablet TAKE 1 TABLET BY MOUTH AT BREAKFAST AND AT BEDTIME 60 tablet 10   rosuvastatin (CRESTOR) 10 MG tablet TAKE 1 TABLET BY MOUTH EVERY DAY AT BEDTIME 30 tablet 10   conjugated estrogens (PREMARIN) vaginal cream Apply 21 days. Take 7 days off. 30 g 12   dextromethorphan-guaiFENesin (MUCINEX DM) 30-600 MG 12hr tablet Take 1-2 tablets by mouth 2 (two) times daily as needed for cough. 120 tablet 0   hydroxychloroquine (PLAQUENIL) 200 MG tablet TAKE 1 TABLET BY MOUTH TWICE DAILY, MONDAY THROUGH FRIDAY. NONE ON SATURDAY OR SUNDAY. (Patient not taking: Reported on 07/14/2023) 40 tablet 0   meclizine (ANTIVERT) 12.5 MG tablet TAKE 1 TABLET BY MOUTH 3 TIMES DAILY AS NEEDED FOR DIZZINESS *REFILL REQUEST* (Patient not taking: Reported on 07/14/2023) 30 tablet 10   No facility-administered medications prior to visit.    Family History  Problem Relation Age of Onset   Diabetes Mother    Heart attack Mother    Kidney disease Mother    Arthritis Father    Stroke Father    Hypertension Father    Cancer Sister    Dementia Sister    Schizophrenia Sister    Breast cancer Neg Hx    Sleep apnea Neg Hx     Social History   Socioeconomic History   Marital status: Single    Spouse name: Not on file   Number of children: Not on file   Years of education: Not on file   Highest education level: Not on file  Occupational History   Not on file  Tobacco Use   Smoking status: Former    Current packs/day: 0.00    Types: Cigarettes    Start date: 01/09/1997    Quit date: 01/09/2017    Years since quitting: 6.5    Passive exposure: Past   Smokeless tobacco: Never   Tobacco comments:    Has stopped vaping now  Vaping Use   Vaping status: Former  Substance and Sexual Activity   Alcohol use: No   Drug use: No   Sexual activity: Not on file  Other Topics Concern   Not on file  Social  History Narrative   Pt lives with roommate    Retired    Chief Executive Officer Drivers of Corporate investment banker Strain: Low Risk  (02/28/2023)   Overall Financial Resource Strain (CARDIA)    Difficulty of Paying Living Expenses: Not hard at all  Food Insecurity: Low Risk  (06/08/2023)   Received from [facility]   Hunger Vital Sign    Worried About Running Out of Food in the Last Year: Never true    Ran Out of Food in the Last Year: Never true  Transportation Needs: No Transportation Needs (06/08/2023)   Received from [facility]    In the past 12 months, has lack of reliable transportation kept you from medical appointments, meetings, work or from getting things needed for daily living? : No  Physical Activity: Insufficiently Active (02/28/2023)   Exercise Vital Sign    Days of Exercise per Week: 7 days    Minutes of Exercise per Session: 20 min  Stress:  No Stress Concern Present (02/28/2023)   [employer] of Occupational Health - Occupational Stress Questionnaire    Feeling of Stress : Not at all  Social Connections: Socially Isolated (02/28/2023)   Social Connection and Isolation Panel [NHANES]    Frequency of Communication with Friends and Family: Three times a week    Frequency of Social Gatherings with Friends and Family: Once a week    Attends Religious Services: Never    Database administrator or Organizations: No    Attends Banker Meetings: Never    Marital Status: Never married  Intimate Partner Violence: Not At Risk (02/28/2023)   Humiliation, Afraid, Rape, and Kick questionnaire    Fear of Current or Ex-Partner: No    Emotionally Abused: No    Physically Abused: No    Sexually Abused: No                                                                                                  Objective:  Physical Exam: BP 124/78   Pulse 67   Temp (!) 97 F (36.1 C) (Temporal)   Wt 155 lb 3.2 oz (70.4 kg)   SpO2 97%   BMI 22.27 kg/m      Physical Exam Constitutional:      General: She is not in acute distress.    Appearance: Normal appearance. She is not ill-appearing or toxic-appearing.  HENT:     Head: Normocephalic and atraumatic.     Nose: Nose normal. No congestion.  Eyes:     General: No scleral icterus.    Extraocular Movements: Extraocular movements intact.  Cardiovascular:     Rate and Rhythm: Normal rate and regular rhythm.     Pulses: Normal pulses.     Heart sounds: Normal heart sounds.  Pulmonary:     Effort: Pulmonary effort is normal. No respiratory distress.     Breath sounds: Normal breath sounds.  Abdominal:     General: Abdomen is flat. Bowel sounds are normal.     Palpations: Abdomen is soft.  Musculoskeletal:        General: Normal range of motion.  Lymphadenopathy:     Cervical: No cervical adenopathy.  Skin:    General: Skin is warm and dry.     Findings: No rash.  Neurological:     General: No focal deficit present.     Mental Status: She is alert and oriented to person, place, and time. Mental status is at baseline.  Psychiatric:        Mood and Affect: Mood normal.        Behavior: Behavior normal.        Thought Content: Thought content normal.        Judgment: Judgment normal.     Diabetic Foot Exam - Simple   Simple Foot Form Diabetic Foot exam was performed with the following findings: Yes 07/14/2023 11:55 AM  Visual Inspection No deformities, no ulcerations, no other skin breakdown bilaterally: Yes Sensation Testing Intact to touch and monofilament testing bilaterally: Yes Pulse Check Posterior Tibialis and Dorsalis pulse intact  bilaterally: Yes Comments Thickening and yellowing of all toenails nails bilaterally.  No other deformities, ulceration, or other skin breakdown bilaterally     No results found.  Recent Results (from the past 2160 hours)  POC COVID-19 [facility]Now     Status: None   Collection Time: 05/30/23  3:52 PM  Result Value Ref Range   SARS  Coronavirus 2 Ag Negative Negative  POCT Influenza A/B     Status: None   Collection Time: 05/30/23  3:52 PM  Result Value Ref Range   Influenza A, POC Negative Negative   Influenza B, POC Negative Negative  Cologuard     Status: Abnormal   Collection Time: 06/08/23  1:00 PM  Result Value Ref Range   COLOGUARD Positive (A) Negative    Comment: POSITIVE TEST RESULT. A positive Cologuard result should be followed with a colonoscopy or visual examination of the colon. The normal value (reference range) for this assay is negative.  TEST DESCRIPTION: Composite algorithmic analysis of stool DNA-biomarkers with hemoglobin immunoassay.   Quantitative values of individual biomarkers are not reportable and are not associated with individual biomarker result reference ranges. Cologuard is intended for colorectal cancer screening of adults of either sex, 45 years or older, who are at average-risk for colorectal cancer (CRC). Cologuard has been approved for use by the [place]. FDA. The performance of Cologuard was established in a cross sectional study of average-risk adults aged 31-84. Cologuard performance in patients ages 58 to 51 years was estimated by sub-group analysis of near-age groups. Colonoscopies performed for a positive result may find as the most clinically significant lesion: colorectal cancer [4.0%], advanced adenoma  (including sessile serrated polyps greater than or equal to 1cm diameter) [20%] or non- advanced adenoma [31%]; or no colorectal neoplasia [45%]. These estimates are derived from a prospective cross-sectional screening study of 10,000 individuals at average risk for colorectal cancer who were screened with both Cologuard and colonoscopy. (Imperiale T. et al, Macy Mis J Med 2014;370(14):1286-1297.) Cologuard may produce a false negative or false positive result (no colorectal cancer or precancerous polyp present at colonoscopy follow up). A negative Cologuard test result does not guarantee the  absence of CRC or advanced adenoma (pre-cancer). The current Cologuard screening interval is every 3 years. Science writer and [place] Therapist, music). Cologuard performance data in a 10,000 patient pivotal study using colonoscopy as the reference method can be accessed at the following location: www.exactlabs.com/results. Additional description of the Cologuard test process,  warnings and precautions can be found at www.cologuard.com.   POCT glycosylated hemoglobin (Hb A1C)     Status: Abnormal   Collection Time: 07/14/23 11:25 AM  Result Value Ref Range   Hemoglobin A1C 8.1 (A) 4.0 - 5.6 %   HbA1c POC (<> result, manual entry)     HbA1c, POC (prediabetic range)     HbA1c, POC (controlled diabetic range)          Garner Nash, MD, MS

## 2023-07-14 NOTE — Patient Instructions (Signed)
 VISIT SUMMARY:  Today, we discussed your diabetes management, chronic constipation, vaginal irritation, nail health, and routine health maintenance. We reviewed your current medications and symptoms, and we have made some adjustments to your treatment plan to better manage your conditions.  YOUR PLAN:  -TYPE 2 DIABETES MELLITUS: Type 2 diabetes is a condition where your body does not use insulin properly, leading to high blood sugar levels. Your Hemoglobin A1c is 8.1%, which means your blood sugar is not well controlled. We will continue your current medication, Farxiga 5 mg, and add pioglitazone 15 mg daily. We also discussed the potential benefits of GLP-1 agonists like Ozempic or Mounjaro, which can help protect your heart and have fewer side effects. You should check your fasting blood sugars regularly. We will also perform a foot exam and order tests to check your kidney function, electrolytes, liver function, and thyroid function.  -CONSTIPATION: Constipation is when you have infrequent or hard-to-pass bowel movements. You have been experiencing this since August and have been using stool softeners with some relief. We will start you on polyethylene glycol (Miralax) one capful daily. You are scheduled for a colonoscopy on March 7th to rule out any serious conditions like colon cancer.  -VAGINAL ATROPHY: Vaginal atrophy is thinning and inflammation of the vaginal walls due to a decrease in estrogen. This can cause irritation and tightness. You have been using a topical cream but found it expensive. We will refer you to an OB/GYN for further evaluation and management, and discuss alternative, more affordable treatments.  -RHEUMATOID ARTHRITIS: Rheumatoid arthritis is an autoimmune condition that causes joint inflammation and can affect your nails. You are not currently taking Plaquenil. We recommend following up with rheumatology for ongoing management and consulting podiatry for your feet and  dermatology for your hands.  -HYPERLIPIDEMIA: Hyperlipidemia is having high levels of fats in your blood, which can increase your risk of heart disease. You are on amlodipine 5 mg for hypertension. We will check your cholesterol and liver function as part of your routine follow-up.  -GENERAL HEALTH MAINTENANCE: Routine health maintenance includes regular screenings and lab tests to monitor your overall health. We will screen for hepatitis C and check your blood counts, ESR, and CRP.  INSTRUCTIONS:  Please follow up in 3 months. Coordinate with the pharmacy team for medication assistance. Ensure you complete your OB/GYN and gastroenterology follow-ups as scheduled.

## 2023-07-14 NOTE — Progress Notes (Signed)
 Lipid panel WNL.  ESR and CRP WNL RBC count. Hgb, and hct remain elevated but have improved. Platelets are significantly low--any excessive bleeding? Blood in stool? Easy bruising? Any recent medication changes?

## 2023-07-15 LAB — HEPATITIS C ANTIBODY: Hepatitis C Ab: NONREACTIVE

## 2023-07-15 LAB — THYROID PANEL WITH TSH
Free Thyroxine Index: 1.8 (ref 1.4–3.8)
T3 Uptake: 28 % (ref 22–35)
T4, Total: 6.3 ug/dL (ref 5.1–11.9)
TSH: 2.6 m[IU]/L (ref 0.40–4.50)

## 2023-07-18 ENCOUNTER — Encounter: Payer: Self-pay | Admitting: Family Medicine

## 2023-07-18 ENCOUNTER — Ambulatory Visit: Payer: Medicare Other

## 2023-07-18 ENCOUNTER — Other Ambulatory Visit: Payer: Self-pay | Admitting: Family Medicine

## 2023-07-18 DIAGNOSIS — R7401 Elevation of levels of liver transaminase levels: Secondary | ICD-10-CM

## 2023-07-18 DIAGNOSIS — D696 Thrombocytopenia, unspecified: Secondary | ICD-10-CM

## 2023-07-18 DIAGNOSIS — D751 Secondary polycythemia: Secondary | ICD-10-CM

## 2023-07-19 ENCOUNTER — Other Ambulatory Visit (INDEPENDENT_AMBULATORY_CARE_PROVIDER_SITE_OTHER)

## 2023-07-19 ENCOUNTER — Other Ambulatory Visit

## 2023-07-19 DIAGNOSIS — D696 Thrombocytopenia, unspecified: Secondary | ICD-10-CM | POA: Diagnosis not present

## 2023-07-19 DIAGNOSIS — R7401 Elevation of levels of liver transaminase levels: Secondary | ICD-10-CM

## 2023-07-19 DIAGNOSIS — D751 Secondary polycythemia: Secondary | ICD-10-CM | POA: Diagnosis not present

## 2023-07-19 DIAGNOSIS — E119 Type 2 diabetes mellitus without complications: Secondary | ICD-10-CM

## 2023-07-19 DIAGNOSIS — Z79899 Other long term (current) drug therapy: Secondary | ICD-10-CM

## 2023-07-19 LAB — HEPATIC FUNCTION PANEL
ALT: 39 U/L — ABNORMAL HIGH (ref 0–35)
AST: 28 U/L (ref 0–37)
Albumin: 4.2 g/dL (ref 3.5–5.2)
Alkaline Phosphatase: 61 U/L (ref 39–117)
Bilirubin, Direct: 0.1 mg/dL (ref 0.0–0.3)
Total Bilirubin: 0.6 mg/dL (ref 0.2–1.2)
Total Protein: 7.2 g/dL (ref 6.0–8.3)

## 2023-07-19 LAB — CBC WITH DIFFERENTIAL/PLATELET
Basophils Absolute: 0.1 10*3/uL (ref 0.0–0.1)
Basophils Relative: 1.2 % (ref 0.0–3.0)
Eosinophils Absolute: 0.2 10*3/uL (ref 0.0–0.7)
Eosinophils Relative: 3.4 % (ref 0.0–5.0)
HCT: 46.3 % — ABNORMAL HIGH (ref 36.0–46.0)
Hemoglobin: 15.3 g/dL — ABNORMAL HIGH (ref 12.0–15.0)
Lymphocytes Relative: 43.6 % (ref 12.0–46.0)
Lymphs Abs: 2.5 10*3/uL (ref 0.7–4.0)
MCHC: 33 g/dL (ref 30.0–36.0)
MCV: 89.8 fl (ref 78.0–100.0)
Monocytes Absolute: 0.5 10*3/uL (ref 0.1–1.0)
Monocytes Relative: 8.3 % (ref 3.0–12.0)
Neutro Abs: 2.5 10*3/uL (ref 1.4–7.7)
Neutrophils Relative %: 43.5 % (ref 43.0–77.0)
Platelets: 65 10*3/uL — ABNORMAL LOW (ref 150.0–400.0)
RBC: 5.16 Mil/uL — ABNORMAL HIGH (ref 3.87–5.11)
RDW: 14.7 % (ref 11.5–15.5)
WBC: 5.8 10*3/uL (ref 4.0–10.5)

## 2023-07-19 LAB — TSH: TSH: 3.13 u[IU]/mL (ref 0.35–5.50)

## 2023-07-19 LAB — HEMOGLOBIN A1C: Hgb A1c MFr Bld: 8.2 % — ABNORMAL HIGH (ref 4.6–6.5)

## 2023-07-20 ENCOUNTER — Encounter: Payer: Self-pay | Admitting: Family Medicine

## 2023-07-20 ENCOUNTER — Other Ambulatory Visit: Payer: Self-pay | Admitting: Family Medicine

## 2023-07-20 DIAGNOSIS — D696 Thrombocytopenia, unspecified: Secondary | ICD-10-CM | POA: Insufficient documentation

## 2023-07-20 DIAGNOSIS — D751 Secondary polycythemia: Secondary | ICD-10-CM | POA: Insufficient documentation

## 2023-07-21 DIAGNOSIS — R195 Other fecal abnormalities: Secondary | ICD-10-CM | POA: Diagnosis not present

## 2023-07-21 DIAGNOSIS — Z1211 Encounter for screening for malignant neoplasm of colon: Secondary | ICD-10-CM | POA: Diagnosis not present

## 2023-07-21 DIAGNOSIS — K621 Rectal polyp: Secondary | ICD-10-CM | POA: Diagnosis not present

## 2023-07-21 DIAGNOSIS — D128 Benign neoplasm of rectum: Secondary | ICD-10-CM | POA: Diagnosis not present

## 2023-07-21 DIAGNOSIS — D123 Benign neoplasm of transverse colon: Secondary | ICD-10-CM | POA: Diagnosis not present

## 2023-07-22 LAB — PERIPHERAL BLOOD SMEAR REVIEW

## 2023-07-22 LAB — EXTRA LAV TOP TUBE

## 2023-07-26 ENCOUNTER — Other Ambulatory Visit: Payer: Self-pay

## 2023-07-26 NOTE — Progress Notes (Signed)
 07/26/2023 Name: Kelli Ruiz MRN: [id number] DOB: Nov 27, 1955  Chief Complaint  Patient presents with   Diabetes Management Plan   Kelli Ruiz is a 68 y.o. year old female who presented for a telephone visit.   They were referred to the pharmacist by their PCP for assistance in managing diabetes.   Subjective:  Care Team: Primary Care Provider: [name], MD ; Next Scheduled Visit: 5/27  Medication Access/Adherence  Current Pharmacy:  [facility] - [address], [place] - [address] [place] [address] Phone: [phone] Fax: [phone]  [facility] - [place], [place] - [address] [place] [address] Phone: [phone] Fax: [phone]  [facility] - [place], [place] - [address] [phone] [place] [address] Phone: [phone] Fax: [phone]  -Patient reports affordability concerns with their medications: Yes  -Patient reports access/transportation concerns to their pharmacy: No  -Patient reports adherence concerns with their medications:  No    Diabetes: Current medications: Farxiga 5mg  daily, pioglitazone 15mg  daily -Medications tried in the past: metformin caused severe diarrhea -Patient recently prescribed pioglitazone 15 mg daily, but she has not yet received this from her mail order pharmacy -PCP would also like to initiate GLP-1 therapy based on recently elevated A1c reading -Patient states she [place] previously [place] for Farxiga-she currently has 1-2 bottles of medication at home but she continues to take -Patient does monitor home blood glucose somewhat regularly and endorses readings of 130-160, but it is unclear how soon or after eating these readings may be  Objective:  Lab Results  Component Value Date   HGBA1C 8.2 (H) 07/19/2023   Lab Results  Component Value Date    CREATININE 0.77 07/14/2023   BUN 12 07/14/2023   NA 137 07/14/2023   K 4.0 07/14/2023   CL 103 07/14/2023   CO2 27 07/14/2023   Medications Reviewed Today     Reviewed by Kelli Ruiz, RPH (Pharmacist) on 07/26/23 at 1118  Med List Status: <None>   Medication Order Taking? Sig Documenting Provider Last Dose Status Informant  ACCU-CHEK GUIDE test strip [id number] Yes USE TO TEST BLOOD SUGAR ONCE DAILY AS DIRECTED Kelli Gunner, MD Taking Active   Accu-Chek Softclix Lancets lancets [id number] Yes USE TO TEST BLOOD SUGAR DAILY AS DIRECTED Kelli Gunner, MD Taking Active   albuterol (VENTOLIN HFA) 108 (90 Base) MCG/ACT inhaler [id number] Yes Inhale 2 puffs into the lungs every 6 (six) hours as needed for wheezing or shortness of breath. Kelli Gunner, MD Taking Active   amLODipine (NORVASC) 5 MG tablet [id number] Yes Take 1 tablet (5 mg total) by mouth daily. Kelli Gunner, MD Taking Active   blood glucose meter kit and supplies [id number] Yes Dispense based on patient and insurance preference. Use up to four times daily as directed. (FOR ICD-10 E10.9, E11.9). Kelli Gunner, MD Taking Active   cetirizine (ALLERGY, CETIRIZINE,) 10 MG tablet [id number]  Take 1 tablet (10 mg total) by mouth daily. Kelli Gunner, MD  Active   citalopram (CELEXA) 40 MG tablet [id number] Yes TAKE 1 TABLET BY MOUTH ONCE DAILY Kelli Gunner, MD Taking Active   diclofenac Sodium (VOLTAREN) 1 % GEL [id number]  Apply 4 g topically 4 (four) times daily as needed. Kelli Gunner, MD  Active   FARXIGA 5 MG TABS tablet [id number] No Take 5 mg  by mouth daily.  Patient not taking: Reported on 07/26/2023   [provider] Not Taking Active   fluticasone (FLONASE) 50 MCG/ACT nasal spray [id number] Yes Place 2 sprays into both nostrils daily as needed for allergies or rhinitis. [name], [place] Taking Active   gabapentin (NEURONTIN) 600 MG tablet [id number] Yes TAKE 1 TABLET BY MOUTH AT  BREAKFAST AND AT BEDTIME Kelli Gunner, MD Taking Active   hydroxychloroquine (PLAQUENIL) 200 MG tablet [id number]  TAKE 1 TABLET BY MOUTH TWICE DAILY, MONDAY THROUGH FRIDAY. NONE ON SATURDAY OR SUNDAY.  Patient not taking: Reported on 07/14/2023   Gearldine Bienenstock, PA-C  Active   pioglitazone (ACTOS) 15 MG tablet [id number] No Take 1 tablet (15 mg total) by mouth daily.  Patient not taking: Reported on 07/26/2023   Kelli Gunner, MD Not Taking Active   polyethylene glycol powder (GLYCOLAX/MIRALAX) 17 GM/SCOOP powder [id number]  Take 17 g by mouth daily. Kelli Gunner, MD  Active   rosuvastatin (CRESTOR) 10 MG tablet [id number] Yes TAKE 1 TABLET BY MOUTH EVERY DAY AT BEDTIME Kelli Gunner, MD Taking Active            Assessment/Plan:   Diabetes: -Currently uncontrolled with A1c >7% -Suggested patient begin monitoring fasting blood glucose and recording daily -Pioglitazone 15 mg should arrive in the next couple of days, and patient will initiate therapy -Continue Farxiga 5 mg daily -I recommend initiating Ozempic 0.25 mg weekly for 4 weeks, then increasing to 0.5 mg weekly -It is likely that as we increase Ozempic we could potentially stop pioglitazone in the future to decrease medication burden -Meets financial criteria for [place] patient assistance program through [name] and Ozempic patient assistance program through [employer]. Will collaborate with provider, CPhT, and patient to pursue assistance.   Follow Up Plan: 2 weeks to see how patient is tolerating pioglitazone 15 mg and check on processing status of patient assistance applications  Kelli Ruiz, PharmD, DPLA

## 2023-07-27 ENCOUNTER — Telehealth: Payer: Self-pay

## 2023-07-27 NOTE — Telephone Encounter (Signed)
 PAP: Patient assistance application for Farxiga through [employer] ([name]) has been mailed to pt's home address on file. Provider portion of application will be faxed to provider's office. PAP for Ozempic was E-filed for Patient and faxed provider portion to office.

## 2023-07-28 NOTE — Telephone Encounter (Signed)
 Form received from [employer] regarding ozempic. Placed in providers office for review.

## 2023-07-29 ENCOUNTER — Other Ambulatory Visit: Payer: Self-pay | Admitting: Family

## 2023-07-29 DIAGNOSIS — D696 Thrombocytopenia, unspecified: Secondary | ICD-10-CM

## 2023-07-29 NOTE — Telephone Encounter (Signed)
 PAP: Application for Ozempic has been submitted to [employer], via fax

## 2023-08-01 ENCOUNTER — Encounter: Payer: Self-pay | Admitting: Family

## 2023-08-01 ENCOUNTER — Inpatient Hospital Stay (HOSPITAL_BASED_OUTPATIENT_CLINIC_OR_DEPARTMENT_OTHER): Admitting: Family

## 2023-08-01 ENCOUNTER — Inpatient Hospital Stay: Attending: Hematology & Oncology

## 2023-08-01 VITALS — BP 109/51 | HR 72 | Temp 98.1°F | Resp 17 | Wt 154.1 lb

## 2023-08-01 DIAGNOSIS — Z7722 Contact with and (suspected) exposure to environmental tobacco smoke (acute) (chronic): Secondary | ICD-10-CM | POA: Diagnosis not present

## 2023-08-01 DIAGNOSIS — Z9089 Acquired absence of other organs: Secondary | ICD-10-CM | POA: Diagnosis not present

## 2023-08-01 DIAGNOSIS — D696 Thrombocytopenia, unspecified: Secondary | ICD-10-CM

## 2023-08-01 DIAGNOSIS — I10 Essential (primary) hypertension: Secondary | ICD-10-CM

## 2023-08-01 DIAGNOSIS — Z91030 Bee allergy status: Secondary | ICD-10-CM | POA: Insufficient documentation

## 2023-08-01 DIAGNOSIS — R634 Abnormal weight loss: Secondary | ICD-10-CM

## 2023-08-01 DIAGNOSIS — R10819 Abdominal tenderness, unspecified site: Secondary | ICD-10-CM | POA: Diagnosis not present

## 2023-08-01 DIAGNOSIS — R5383 Other fatigue: Secondary | ICD-10-CM | POA: Diagnosis not present

## 2023-08-01 DIAGNOSIS — K621 Rectal polyp: Secondary | ICD-10-CM

## 2023-08-01 DIAGNOSIS — Z79899 Other long term (current) drug therapy: Secondary | ICD-10-CM

## 2023-08-01 DIAGNOSIS — Z833 Family history of diabetes mellitus: Secondary | ICD-10-CM

## 2023-08-01 DIAGNOSIS — Z87891 Personal history of nicotine dependence: Secondary | ICD-10-CM | POA: Diagnosis not present

## 2023-08-01 DIAGNOSIS — Z8249 Family history of ischemic heart disease and other diseases of the circulatory system: Secondary | ICD-10-CM

## 2023-08-01 DIAGNOSIS — Z809 Family history of malignant neoplasm, unspecified: Secondary | ICD-10-CM | POA: Diagnosis not present

## 2023-08-01 DIAGNOSIS — E119 Type 2 diabetes mellitus without complications: Secondary | ICD-10-CM | POA: Diagnosis not present

## 2023-08-01 DIAGNOSIS — R1012 Left upper quadrant pain: Secondary | ICD-10-CM

## 2023-08-01 DIAGNOSIS — Z801 Family history of malignant neoplasm of trachea, bronchus and lung: Secondary | ICD-10-CM | POA: Insufficient documentation

## 2023-08-01 DIAGNOSIS — G2581 Restless legs syndrome: Secondary | ICD-10-CM

## 2023-08-01 DIAGNOSIS — Z8261 Family history of arthritis: Secondary | ICD-10-CM

## 2023-08-01 DIAGNOSIS — Z841 Family history of disorders of kidney and ureter: Secondary | ICD-10-CM | POA: Diagnosis not present

## 2023-08-01 DIAGNOSIS — G4730 Sleep apnea, unspecified: Secondary | ICD-10-CM | POA: Diagnosis not present

## 2023-08-01 DIAGNOSIS — K5909 Other constipation: Secondary | ICD-10-CM | POA: Diagnosis not present

## 2023-08-01 DIAGNOSIS — Z823 Family history of stroke: Secondary | ICD-10-CM

## 2023-08-01 DIAGNOSIS — R1010 Upper abdominal pain, unspecified: Secondary | ICD-10-CM

## 2023-08-01 DIAGNOSIS — Z818 Family history of other mental and behavioral disorders: Secondary | ICD-10-CM | POA: Diagnosis not present

## 2023-08-01 LAB — CBC WITH DIFFERENTIAL (CANCER CENTER ONLY)
Abs Immature Granulocytes: 0.02 10*3/uL (ref 0.00–0.07)
Basophils Absolute: 0.1 10*3/uL (ref 0.0–0.1)
Basophils Relative: 1 %
Eosinophils Absolute: 0.3 10*3/uL (ref 0.0–0.5)
Eosinophils Relative: 4 %
HCT: 47.4 % — ABNORMAL HIGH (ref 36.0–46.0)
Hemoglobin: 15.4 g/dL — ABNORMAL HIGH (ref 12.0–15.0)
Immature Granulocytes: 0 %
Lymphocytes Relative: 49 %
Lymphs Abs: 3.6 10*3/uL (ref 0.7–4.0)
MCH: 29.6 pg (ref 26.0–34.0)
MCHC: 32.5 g/dL (ref 30.0–36.0)
MCV: 91.2 fL (ref 80.0–100.0)
Monocytes Absolute: 0.6 10*3/uL (ref 0.1–1.0)
Monocytes Relative: 8 %
Neutro Abs: 2.8 10*3/uL (ref 1.7–7.7)
Neutrophils Relative %: 38 %
Platelet Count: 96 10*3/uL — ABNORMAL LOW (ref 150–400)
RBC: 5.2 MIL/uL — ABNORMAL HIGH (ref 3.87–5.11)
RDW: 13.8 % (ref 11.5–15.5)
WBC Count: 7.3 10*3/uL (ref 4.0–10.5)
nRBC: 0 % (ref 0.0–0.2)

## 2023-08-01 LAB — CMP (CANCER CENTER ONLY)
ALT: 62 U/L — ABNORMAL HIGH (ref 0–44)
AST: 42 U/L — ABNORMAL HIGH (ref 15–41)
Albumin: 4.5 g/dL (ref 3.5–5.0)
Alkaline Phosphatase: 63 U/L (ref 38–126)
Anion gap: 9 (ref 5–15)
BUN: 14 mg/dL (ref 8–23)
CO2: 31 mmol/L (ref 22–32)
Calcium: 9.5 mg/dL (ref 8.9–10.3)
Chloride: 100 mmol/L (ref 98–111)
Creatinine: 0.86 mg/dL (ref 0.44–1.00)
GFR, Estimated: >60 mL/min (ref >60)
Glucose, Bld: 189 mg/dL — ABNORMAL HIGH (ref 70–99)
Potassium: 4.2 mmol/L (ref 3.5–5.1)
Sodium: 140 mmol/L (ref 135–145)
Total Bilirubin: 0.4 mg/dL (ref 0.0–1.2)
Total Protein: 7.4 g/dL (ref 6.5–8.1)

## 2023-08-01 LAB — LACTATE DEHYDROGENASE: LDH: 172 U/L (ref 98–192)

## 2023-08-01 LAB — PLATELET FUNCTION ASSAY: Collagen / Epinephrine: 106 s (ref 0–193)

## 2023-08-01 LAB — SAVE SMEAR(SSMR), FOR PROVIDER SLIDE REVIEW

## 2023-08-01 NOTE — Progress Notes (Signed)
 Hematology/Oncology Consultation   Name: Kelli Ruiz      MRN: [id number]    Location: Room/bed info not found  Date: 08/01/2023 Time:11:23 AM   REFERRING PHYSICIAN:  Clifton Custard B. Janee Morn, MD  REASON FOR CONSULT:  Thrombocytopenia    DIAGNOSIS: Thrombocytopenia   HISTORY OF PRESENT ILLNESS: [name] is a pleasant 68 yo caucasian female with new thrombocytopenia noted over the last few weeks.  She notes persistent fatigue.  Platelets today are 96, WBC count 7.3, Hgb 15.4 and MCV 91.  She states that she has lost 50-60 lbs over the last year and a half without trying.  She states that she has a good appetite and is staying well hydrated throughout the day.  No history of hepatitis or HIV.  No hot flashes or night sweats.  She has mild tenderness in both the left and right upper quadrants of the abdomen on exam. No organomegaly noted.  She has pain in the left upper quadrant quite a bit. This is relieved with BM or urination.  She had her colonoscopy last week with [facility] and record is not yet available in Care Everywhere. She states that she had 3 polyps removed. Polyp at rectum was hyperplastic She states that she has chronic constipation and her recent Cologuard was positive leading to the colonoscopy.  Mammogram in 10/2022 was negative.  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 She decided that she did not want to start treatment with Plaquenil. She states that the swelling and pain right now are stable and tolerable.  She takes Neurontin for restless leg syndrome.  No swelling in her extremities outside of the joints in her hands with arthritis.  She has history of vertigo. No recent dizziness.  No thyroid disease.  She has diabetes type 2 and is currently on Actos and [place]. Hgb A1c was 8.1.  No personal history of cancer. Her sister had lung cancer.  She states that her father was a "free bleeder" and had history of stroke.  She has no personal history of thrombotic event.   She has  has had an appendectomy and 5 separate surgeries on the right knee.  She denies any issue with frequent or recurrent infection.  No fever, chills, n/v, cough, rash, SOB, chest pain, palpitations or changes in bladder habits.  She has had some vaginal itching and burning. PCP gave her a script for estrogen cream but she states that cost was > $400. She is waiting to see what her insurance will cover.  She quite smoking 3 years ago. No recreational drug use.  No ETOH use.  She enjoys walking her dog several times a day for exercise.   ROS: All other 10 point review of systems is negative.   PAST MEDICAL HISTORY:   Past Medical History:  Diagnosis Date   Arthritis    Complication of anesthesia    " they had a little bit of a hard time waking me after they took a nerve out of my leg"    Depression    Diabetes mellitus without complication (HCC)    Hypertension    Neuromuscular disorder (HCC)    Obese 01/19/2017   RLS (restless legs syndrome)    Sleep apnea    Vertigo     ALLERGIES: Allergies  Allergen Reactions   Bee Pollen       MEDICATIONS:  Current Outpatient Medications on File Prior to Visit  Medication Sig Dispense Refill   ACCU-CHEK GUIDE test strip  USE TO TEST BLOOD SUGAR ONCE DAILY AS DIRECTED 50 each 10   Accu-Chek Softclix Lancets lancets USE TO TEST BLOOD SUGAR DAILY AS DIRECTED 100 each 10   albuterol (VENTOLIN HFA) 108 (90 Base) MCG/ACT inhaler Inhale 2 puffs into the lungs every 6 (six) hours as needed for wheezing or shortness of breath. 8 g 0   amLODipine (NORVASC) 5 MG tablet Take 1 tablet (5 mg total) by mouth daily. 90 tablet 3   blood glucose meter kit and supplies Dispense based on patient and insurance preference. Use up to four times daily as directed. (FOR ICD-10 E10.9, E11.9). 1 each 0   cetirizine (ALLERGY, CETIRIZINE,) 10 MG tablet Take 1 tablet (10 mg total) by mouth daily. 90 tablet 1   citalopram (CELEXA) 40 MG tablet TAKE 1 TABLET BY MOUTH ONCE  DAILY 30 tablet 10   diclofenac Sodium (VOLTAREN) 1 % GEL Apply 4 g topically 4 (four) times daily as needed. 100 g 3   FARXIGA 5 MG TABS tablet Take 5 mg by mouth daily.     fluticasone (FLONASE) 50 MCG/ACT nasal spray Place 2 sprays into both nostrils daily as needed for allergies or rhinitis. 16 g 3   gabapentin (NEURONTIN) 600 MG tablet TAKE 1 TABLET BY MOUTH AT BREAKFAST AND AT BEDTIME 60 tablet 10   hydroxychloroquine (PLAQUENIL) 200 MG tablet TAKE 1 TABLET BY MOUTH TWICE DAILY, MONDAY THROUGH FRIDAY. NONE ON SATURDAY OR SUNDAY. (Patient not taking: Reported on 07/14/2023) 40 tablet 0   pioglitazone (ACTOS) 15 MG tablet Take 1 tablet (15 mg total) by mouth daily. (Patient not taking: Reported on 07/26/2023) 90 tablet 3   polyethylene glycol powder (GLYCOLAX/MIRALAX) 17 GM/SCOOP powder Take 17 g by mouth daily. 500 g 5   rosuvastatin (CRESTOR) 10 MG tablet TAKE 1 TABLET BY MOUTH EVERY DAY AT BEDTIME 30 tablet 10   No current facility-administered medications on file prior to visit.     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 Her smoking use included cigarettes. She started smoking about 26 years ago.  She has been exposed to tobacco smoke. She has never used smokeless tobacco. She reports that she does not drink alcohol and does not use drugs.  PERFORMANCE STATUS: The patient's performance status is 1 - Symptomatic but completely ambulatory  PHYSICAL EXAM: Most Recent Vital Signs: There were no vitals taken for this visit. There were no vitals taken for this visit.  General Appearance:    Alert, cooperative, no distress, appears stated age  Head:    Normocephalic,  without obvious abnormality, atraumatic  Eyes:    PERRL, conjunctiva/corneas clear, EOM's intact, fundi    benign, both eyes             Throat:   Lips, mucosa, and tongue normal; teeth and gums normal  Neck:   Supple, symmetrical, trachea midline, no adenopathy;       thyroid:  No enlargement/tenderness/nodules; no carotid   bruit or JVD  Back:     Symmetric, no curvature, ROM normal, no CVA tenderness  Lungs:     Clear to auscultation bilaterally, respirations unlabored  Chest wall:    No tenderness or deformity  Heart:    Regular rate and rhythm, S1 and S2 normal, no murmur, rub   or gallop  Abdomen:     Soft, non-tender, bowel sounds active all four quadrants,    no masses, no organomegaly        Extremities:   Extremities normal, atraumatic, no cyanosis or edema  Pulses:   2+ and symmetric all extremities  Skin:   Skin color, texture, turgor normal, no rashes or lesions  Lymph nodes:   Cervical, supraclavicular, and axillary nodes normal  Neurologic:   CNII-XII intact. Normal strength, sensation and reflexes      throughout    LABORATORY DATA:  No results found for this or any previous visit (from the past 48 hours).    RADIOGRAPHY: No results found.     PATHOLOGY: None  ASSESSMENT/PLAN: Ms. Lortz is a pleasant 68 yo caucasian female with new thrombocytopenia noted over the last few weeks.  CBC with diff, CMP and blood smear reviewed with Dr. Myna Hidalgo. No abnormality or evidence of malignancy noted on  smear.  With her abdominal discomfort as mentioned above, we will proceed with CT scans to assess for cause.  Follow-up pending results.   All questions were answered. The patient knows to call the clinic with any problems, questions or concerns. We can certainly see the patient much sooner if necessary.  The patient was discussed with Dr. Myna Hidalgo and he is in agreement with the aforementioned.   Eileen Stanford, NP

## 2023-08-02 ENCOUNTER — Telehealth: Payer: Self-pay | Admitting: Family Medicine

## 2023-08-02 NOTE — Telephone Encounter (Signed)
 Copied from CRM [id number]. Topic: Referral - Request for Referral >> Aug 02, 2023  3:49 PM Isabell A wrote: Did the patient discuss referral with their provider in the last year? Yes  (If No - schedule appointment) (If Yes - send message)  Appointment offered? N/A  Type of order/referral and detailed reason for visit: OBGYN  Preference of office, provider, location: [place] area   If referral order, have you been seen by this specialty before? No (If Yes, this issue or another issue? When? Where?  Can we respond through MyChart? No >> Aug 02, 2023  3:51 PM Isabell A wrote: Patient prefers [place] area instead.

## 2023-08-02 NOTE — Telephone Encounter (Signed)
 Dr Janee Morn sent a referral on 07/14/23 to [facility].  She would now like the location changed.

## 2023-08-02 NOTE — Telephone Encounter (Signed)
 error

## 2023-08-03 NOTE — Telephone Encounter (Signed)
 Referral moved  Location Address:  [facility] [address], [place], [place] [address]

## 2023-08-03 NOTE — Telephone Encounter (Signed)
 PAP: Patient assistance application for Ozempic has been approved by PAP Companies: [employer] from 08-01-2023 to 05-16-2024. Medication should be delivered to PAP Delivery: Provider's office. For further shipping updates, please [employer] at [phone]. Patient ID is: [id number]

## 2023-08-03 NOTE — Progress Notes (Signed)
 Pharmacy Medication Assistance Program Note    08/03/2023  Patient ID: Kelli Ruiz, female   DOB: Nov 05, 1955, 68 y.o.   MRN: [id number]     08/03/2023  Outreach Medication One  Initial Outreach Date (Medication One) 07/27/2023  Manufacturer Medication One [employer] Drugs Ozempic  Type of Assistance Manufacturer Assistance  Date Application Sent to Patient 07/27/2023  Application Items Requested Application;Proof of Income  Date Application Sent to Prescriber 07/27/2023  Name of Prescriber Fanny Bien  Date Application Received From Provider 07/29/2023  Date Application Submitted to Manufacturer 07/29/2023  Method Application Sent to Manufacturer Fax  Patient Assistance Determination Approved  Approval Start Date 08/01/2023  Approval End Date 05/16/2024  Patient Notification Method MyChart

## 2023-08-04 NOTE — Telephone Encounter (Signed)
 4 boxes Ozempic arrived to the office. Placed in fridge by A. Gable, RMA.

## 2023-08-08 ENCOUNTER — Encounter: Payer: Self-pay | Admitting: Family Medicine

## 2023-08-08 ENCOUNTER — Encounter (HOSPITAL_BASED_OUTPATIENT_CLINIC_OR_DEPARTMENT_OTHER): Payer: Self-pay

## 2023-08-08 ENCOUNTER — Ambulatory Visit (HOSPITAL_BASED_OUTPATIENT_CLINIC_OR_DEPARTMENT_OTHER)

## 2023-08-08 ENCOUNTER — Ambulatory Visit (HOSPITAL_BASED_OUTPATIENT_CLINIC_OR_DEPARTMENT_OTHER)
Admission: RE | Admit: 2023-08-08 | Discharge: 2023-08-08 | Disposition: A | Discharge status: Home / Self Care | Source: Ambulatory Visit | Attending: Family Medicine

## 2023-08-08 ENCOUNTER — Ambulatory Visit (HOSPITAL_BASED_OUTPATIENT_CLINIC_OR_DEPARTMENT_OTHER)
Admission: RE | Admit: 2023-08-08 | Discharge: 2023-08-08 | Disposition: A | Discharge status: Home / Self Care | Source: Ambulatory Visit | Attending: Family | Admitting: Family

## 2023-08-08 DIAGNOSIS — R7989 Other specified abnormal findings of blood chemistry: Secondary | ICD-10-CM | POA: Diagnosis not present

## 2023-08-08 DIAGNOSIS — D696 Thrombocytopenia, unspecified: Secondary | ICD-10-CM | POA: Diagnosis not present

## 2023-08-08 DIAGNOSIS — K760 Fatty (change of) liver, not elsewhere classified: Secondary | ICD-10-CM | POA: Insufficient documentation

## 2023-08-08 DIAGNOSIS — R634 Abnormal weight loss: Secondary | ICD-10-CM

## 2023-08-08 DIAGNOSIS — R1010 Upper abdominal pain, unspecified: Secondary | ICD-10-CM

## 2023-08-08 DIAGNOSIS — R918 Other nonspecific abnormal finding of lung field: Secondary | ICD-10-CM | POA: Diagnosis not present

## 2023-08-08 DIAGNOSIS — R7401 Elevation of levels of liver transaminase levels: Secondary | ICD-10-CM | POA: Diagnosis not present

## 2023-08-08 DIAGNOSIS — I700 Atherosclerosis of aorta: Secondary | ICD-10-CM | POA: Diagnosis not present

## 2023-08-08 MED ORDER — IOHEXOL 300 MG/ML  SOLN
100.0000 mL | Freq: Once | INTRAMUSCULAR | Status: AC | PRN
  Administered 2023-08-08: 100 mL via INTRAVENOUS

## 2023-08-08 NOTE — Telephone Encounter (Signed)
Patient picked up medication today.

## 2023-08-09 ENCOUNTER — Ambulatory Visit: Payer: Self-pay

## 2023-08-09 ENCOUNTER — Other Ambulatory Visit: Payer: Self-pay

## 2023-08-09 NOTE — Patient Outreach (Signed)
 Care Coordination   Follow Up Visit Note   08/09/2023 Name: Kelli Ruiz MRN: [id number] DOB: [birth year]  Kelli Ruiz is a 68 y.o. year old female who sees Kelli Gunner, MD for primary care. I spoke with  Kelli Ruiz by phone today.  What matters to the patients health and wellness today?  Maintaining health    Goals Addressed             This Visit's Progress    Health Maintenance-DM, HTN       Care Coordination Interventions: Provided education to patient about basic DM disease process Reviewed medications with patient and discussed importance of medication adherence Evaluation of current treatment plan related to hypertension self management and patient's adherence to plan as established by provider Provided education to patient re: stroke prevention, s/s of heart attack and stroke   Patient reports she is doing okay.  She is seeing oncology for Thrombocytopenia.  CT scan done of abdomen/waiting results.  Will follow up with oncology about that.  She says PCP ordered Ultrasound of her stomach and that was normal.  Her A1c was 8.1.  She has ozempic from patient assistance but was sent the wrong dosing so trying to get correct dosing.  She works with pharmacist Kelli Ruiz. Reviewed diabetes management and how diet and activity help with management. She has not been checking sugars recently due to having things going on.  She states she will start back monitoring her sugars.         SDOH assessments and interventions completed:  Yes  SDOH Interventions Today    Flowsheet Row Most Recent Value  SDOH Interventions   Food Insecurity Interventions Intervention Not Indicated  Housing Interventions Intervention Not Indicated  Transportation Interventions Intervention Not Indicated  Utilities Interventions Intervention Not Indicated        Care Coordination Interventions:  Yes, provided   Follow up plan: Follow up call scheduled for 09/06/23    Encounter Outcome:   Patient Visit Completed   [name] RN, MSN [place]  [facility], [facility] RN Care Manager Direct Dial: [phone]  Fax: [phone] Website: [email or web address]

## 2023-08-09 NOTE — Progress Notes (Signed)
   08/09/2023  Patient ID: Kelli Ruiz, female   DOB: Feb 19, 1956, 68 y.o.   MRN: [id number]  Subjective/Objective Telephone visit to follow-up on management of diabetes  Diabetes Management Plan -Current medications:  Farxiga 5mg  daily, pioglitazone 15mg  daily -Patient unable to tolerate 10mg  of Farxiga in the past due to excessive urination -Recently started pioglitazone and endorses tolerating well -Patient approved for Novo PAP and already received Ozempic 1mg , but she is supposed to be starting on 0.25mg  x4 weeks, then increasing to 0.5mg  weekly -Last A1c elevated at 8.2%  Assessment/Plan  Diabetes Management Plan -Patient prefers to hold off on Ozempic therapy until she can receive 0.25/0.5mg  pens  -Contacted Novo, and order change form for 0.25/0.5mg  must be submitted.  Representative states 1mg  order recently delivered will not delay or prohibit fill for lower dose pens.  Prefilled order change form and faxing to PCP to complete and fax into [employer] -Continue current regimen and daily monitoring/recording of BG at this time -Goal to initiate/titrate Ozempic and be able to stop pioglitazone in the future  Follow-up:  Will monitor progress of Ozempic order change, notify patient, and schedule f/u appropriately  Lenna Gilford, PharmD, DPLA

## 2023-08-09 NOTE — Patient Instructions (Signed)
 Visit Information  Thank you for taking time to visit with me today. Please don't hesitate to contact me if I can be of assistance to you.   Following are the goals we discussed today:   Goals Addressed             This Visit's Progress    Health Maintenance-DM, HTN       Care Coordination Interventions: Provided education to patient about basic DM disease process Reviewed medications with patient and discussed importance of medication adherence Evaluation of current treatment plan related to hypertension self management and patient's adherence to plan as established by provider Provided education to patient re: stroke prevention, s/s of heart attack and stroke   Patient reports she is doing okay.  She is seeing oncology for Thrombocytopenia.  CT scan done of abdomen/waiting results.  Will follow up with oncology about that.  She says PCP ordered Ultrasound of her stomach and that was normal.  Her A1c was 8.1.  She has ozempic from patient assistance but was sent the wrong dosing so trying to get correct dosing.  She works with pharmacist Sabino Niemann. Reviewed diabetes management and how diet and activity help with management. She has not been checking sugars recently due to having things going on.  She states she will start back monitoring her sugars.         Your next appointment is by telephone on 09/06/23 at 1115 am  Please call the care guide team at [phone] if you need to cancel or reschedule your appointment.   If you are experiencing a Mental Health or Behavioral Health Crisis or need someone to talk to, please call the Suicide and Crisis Lifeline: 988   Patient verbalizes understanding of instructions and care plan provided today and agrees to view in MyChart. Active MyChart status and patient understanding of how to access instructions and care plan via MyChart confirmed with patient.     The patient has been provided with contact information for the care management  team and has been advised to call with any health related questions or concerns.   [name] RN, MSN [facility], [facility] RN Care Manager Direct Dial: [phone]  Fax: [phone] Website: [email or web address]

## 2023-08-10 ENCOUNTER — Telehealth: Payer: Self-pay

## 2023-08-10 NOTE — Telephone Encounter (Signed)
 Forwarding message below

## 2023-08-10 NOTE — Telephone Encounter (Signed)
 Copied from CRM [id number]. Topic: General - Other >> Aug 10, 2023 10:14 AM Kathryne Eriksson wrote: Reason for CRM: Requesting A Call Back >> Aug 10, 2023 10:15 AM Kathryne Eriksson wrote: Patient is requesting a call back from Lenna Gilford, [facility]. Patient call back number is [phone]

## 2023-08-10 NOTE — Progress Notes (Signed)
   08/10/2023  Patient ID: Kelli Ruiz, female   DOB: [birth year], 68 y.o.   MRN: [id number]  Clinic routed request stating patient requesting call back from me.  Contacted [name], and she states [place] 5mg  is going to cost >$300 to refill, which she cannot afford.  She has already filled out the patient portion of the [name] PAP application, and will bring this to me in person 4/1.  Lenna Gilford, PharmD, DPLA

## 2023-08-15 ENCOUNTER — Telehealth: Payer: Self-pay | Admitting: Family

## 2023-08-15 ENCOUNTER — Other Ambulatory Visit: Payer: Self-pay | Admitting: Family

## 2023-08-15 ENCOUNTER — Telehealth: Payer: Self-pay | Admitting: *Deleted

## 2023-08-15 DIAGNOSIS — R911 Solitary pulmonary nodule: Secondary | ICD-10-CM

## 2023-08-15 DIAGNOSIS — Z87891 Personal history of nicotine dependence: Secondary | ICD-10-CM

## 2023-08-15 NOTE — Telephone Encounter (Signed)
 Call from [place], [facility] w/ results on CT. Results given to Maralyn Sago, NP who is aware awaiting MD recommendations.

## 2023-08-15 NOTE — Telephone Encounter (Signed)
 I was able to speak with the patient about her CT scan results and new lung nodule as well as hepatic steatosis. We are going to send her for a PET scan to determine if this is a new lung primary and next steps. Patient in agreement with the plan. No questions at this time. Patient appreciative of call.

## 2023-08-16 ENCOUNTER — Telehealth: Payer: Self-pay

## 2023-08-16 NOTE — Progress Notes (Signed)
   08/16/2023  Patient ID: Kelli Ruiz, female   DOB: 1955/05/18, 68 y.o.   MRN: [id number]  Contacted [employer] to check on order change form for Ozempic 0.25/0.5mg  since medication sent was 1mg  (and this is new start for patient).  Company states dose change needs to be resent.  Contacting Dr. Carollee Massed office to see if they still have a copy available, or if I need to refax for his signature.  Patient was also going to bring her portion of Farxiga PAP  by the office today- checking to see if this was received.  Lenna Gilford, PharmD, DPLA

## 2023-08-19 NOTE — Telephone Encounter (Signed)
 Fax received to notify that patient assistance approved for Ozempic. Document in pt Media tab.

## 2023-08-22 ENCOUNTER — Encounter (HOSPITAL_COMMUNITY)
Admission: RE | Admit: 2023-08-22 | Discharge: 2023-08-22 | Disposition: A | Discharge status: Home / Self Care | Source: Ambulatory Visit | Attending: Family | Admitting: Family

## 2023-08-22 DIAGNOSIS — R911 Solitary pulmonary nodule: Secondary | ICD-10-CM | POA: Insufficient documentation

## 2023-08-22 DIAGNOSIS — Z87891 Personal history of nicotine dependence: Secondary | ICD-10-CM | POA: Insufficient documentation

## 2023-08-22 DIAGNOSIS — R935 Abnormal findings on diagnostic imaging of other abdominal regions, including retroperitoneum: Secondary | ICD-10-CM | POA: Diagnosis not present

## 2023-08-22 LAB — GLUCOSE, CAPILLARY: Glucose-Capillary: 143 mg/dL — ABNORMAL HIGH (ref 70–99)

## 2023-08-22 MED ORDER — FLUDEOXYGLUCOSE F - 18 (FDG) INJECTION
7.7000 | Freq: Once | INTRAVENOUS | Status: AC | PRN
  Administered 2023-08-22: 7.7 via INTRAVENOUS

## 2023-08-22 NOTE — Telephone Encounter (Signed)
 PAP: Application for Marcelline Deist has been submitted to [employer], via fax

## 2023-08-22 NOTE — Progress Notes (Signed)
   08/22/2023  Patient ID: Kelli Ruiz, female   DOB: [birth year], 68 y.o.   MRN: [id number]  Ozempic 0.25/0.5mg  PAP approved 4/3, and medication should arrive by 4/25.  Farxiga PAP application faxed to [name].  Sent patient a MyChart message to notify and schedule f/u in approximately 6 weeks.  Lenna Gilford, PharmD, DPLA

## 2023-08-22 NOTE — Telephone Encounter (Signed)
 PAP: Patient assistance application for Marcelline Deist has been approved by PAP Companies: [name] from 08/22/2023 to 05/16/2024. Medication should be delivered to PAP Delivery: Home. For further shipping updates, please contact [employer] ([name]) at [phone]. Patient ID is: [id number]

## 2023-08-29 ENCOUNTER — Telehealth: Payer: Self-pay | Admitting: Family

## 2023-08-29 DIAGNOSIS — Z87891 Personal history of nicotine dependence: Secondary | ICD-10-CM

## 2023-08-29 DIAGNOSIS — R911 Solitary pulmonary nodule: Secondary | ICD-10-CM

## 2023-08-29 NOTE — Telephone Encounter (Signed)
 I was able to speak with the patient and go over PET scan results. Thankfully this did not show any abnormal uptake. We will repeat a chest CT in 3 months with follow-up to monitor the right lower lobe nodule. No questions or concerns at this time. Patient appreciative of call.

## 2023-09-06 ENCOUNTER — Other Ambulatory Visit: Payer: Self-pay

## 2023-09-06 ENCOUNTER — Telehealth: Payer: Self-pay

## 2023-09-06 NOTE — Patient Outreach (Signed)
 Complex Care Management   Visit Note  09/06/2023  Name:  Kelli Ruiz MRN: [id number] DOB: 10-29-1955  Situation: Referral received for Complex Care Management related to Diabetes with Complications I obtained verbal consent from Patient.  Visit completed with patient  on the phone  Background:   Past Medical History:  Diagnosis Date   Arthritis    Complication of anesthesia    " they had a little bit of a hard time waking me after they took a nerve out of my leg"    Depression    Diabetes mellitus without complication (HCC)    Hypertension    Neuromuscular disorder (HCC)    Obese 01/19/2017   RLS (restless legs syndrome)    Sleep apnea    Vertigo     Assessment: Patient Reported Symptoms:  Cognitive Cognitive Status: Alert and oriented to person, place, and time      Neurological Neurological Review of Symptoms: No symptoms reported    HEENT HEENT Symptoms Reported:  (patient with OSA, uses CPAP) HEENT Management Strategies: Medical device    Cardiovascular Cardiovascular Symptoms Reported: No symptoms reported    Respiratory Respiratory Symptoms Reported: Wheezing Additional Respiratory Details: treated with medication as needed Respiratory Conditions: Seasonal allergies  Endocrine Patient reports the following symptoms related to hypoglycemia or hyperglycemia : No symptoms reported Is patient diabetic?: Yes (A1C 8.1) Is patient checking blood sugars at home?: Yes Endocrine Conditions: Diabetes Endocrine Management Strategies: Adequate rest, Activity, Diet modification, Exercise, Routine screening, Medication therapy, Weight management  Gastrointestinal Additional Gastrointestinal Details: recent colonoscopy with benign polyp removal      Genitourinary      Integumentary Integumentary Symptoms Reported: No symptoms reported    Musculoskeletal Musculoskelatal Symptoms Reviewed: Muscle pain Additional Musculoskeletal Details: s/p R TKA x2 Musculoskeletal  Conditions: Rheumatoid arthritis Musculoskeletal Management Strategies: Medication therapy, Routine screening, Exercise Falls in the past year?: No    Psychosocial       Quality of Family Relationships: helpful, involved, supportive Do you feel physically threatened by others?: No      08/09/2023   11:32 AM  Depression screen PHQ 2/9  Decreased Interest 0  Down, Depressed, Hopeless 0  PHQ - 2 Score 0    There were no vitals filed for this visit.  Medications Reviewed Today     Reviewed by Clarnce Crow, RN (Registered Nurse) on 09/06/23 at 1142  Med List Status: <None>   Medication Order Taking? Sig Documenting Provider Last Dose Status Informant  ACCU-CHEK GUIDE test strip [id number] Yes USE TO TEST BLOOD SUGAR ONCE DAILY AS DIRECTED Catheryn Cluck, MD Taking Active            Med Note Burley Carpenter, Derreon Consalvo   Tue Sep 06, 2023 11:39 AM) Patient to refill has not been taking BS readings recently  Accu-Chek Softclix Lancets lancets [id number] Yes USE TO TEST BLOOD SUGAR DAILY AS DIRECTED Catheryn Cluck, MD Taking Active   albuterol  (VENTOLIN  HFA) 108 [phone] Base) MCG/ACT inhaler [id number] Yes Inhale 2 puffs into the lungs every 6 (six) hours as needed for wheezing or shortness of breath. Catheryn Cluck, MD Taking Active   amLODipine  (NORVASC ) 5 MG tablet [id number] Yes Take 1 tablet (5 mg total) by mouth daily. Catheryn Cluck, MD Taking Active   blood glucose meter kit and supplies [id number] Yes Dispense based on patient and insurance preference. Use up to four times daily as directed. (FOR ICD-10 E10.9, E11.9). Catheryn Cluck, MD Taking Active  cetirizine  (ALLERGY, CETIRIZINE ,) 10 MG tablet [id number] Yes Take 1 tablet (10 mg total) by mouth daily. Catheryn Cluck, MD Taking Active   citalopram  (CELEXA ) 40 MG tablet [id number] Yes TAKE 1 TABLET BY MOUTH ONCE DAILY Catheryn Cluck, MD Taking Active   diclofenac  Sodium (VOLTAREN ) 1 % GEL [id number] Yes Apply 4 g topically 4 (four)  times daily as needed. Catheryn Cluck, MD Taking Active   FARXIGA  5 MG TABS tablet [id number] Yes Take 5 mg by mouth daily. [provider] Taking Active            Med Note Finley Hugh, CHERYL A   Wed Aug 10, 2023  5:33 PM) Working on PAP  fluticasone  (FLONASE ) 50 MCG/ACT nasal spray [id number] Yes Place 2 sprays into both nostrils daily as needed for allergies or rhinitis. Angelia Kelp, PA-C Taking Active   gabapentin  (NEURONTIN ) 600 MG tablet [id number] Yes TAKE 1 TABLET BY MOUTH AT BREAKFAST AND AT BEDTIME Catheryn Cluck, MD Taking Active   hydroxychloroquine  (PLAQUENIL ) 200 MG tablet [id number] No TAKE 1 TABLET BY MOUTH TWICE DAILY, MONDAY THROUGH FRIDAY. NONE ON SATURDAY OR SUNDAY.  Patient not taking: Reported on 09/06/2023   Romayne Clubs, PA-C Not Taking Active   pioglitazone  (ACTOS ) 15 MG tablet [id number] Yes Take 1 tablet (15 mg total) by mouth daily. Catheryn Cluck, MD Taking Active   polyethylene glycol powder (GLYCOLAX /MIRALAX ) 17 GM/SCOOP powder [id number] Yes Take 17 g by mouth daily. Catheryn Cluck, MD Taking Active   rosuvastatin  (CRESTOR ) 10 MG tablet [id number] Yes TAKE 1 TABLET BY MOUTH EVERY DAY AT BEDTIME Catheryn Cluck, MD Taking Active   Semaglutide,0.25 or 0.5MG /DOS, (OZEMPIC, 0.25 OR 0.5 MG/DOSE,) 2 MG/3ML SOPN [id number] Yes Inject into the skin. Inject 0.25mg  weekly x4 weeks, then increase to 0.5mg  weekly [provider] 09/06/2023 Active            Med Note Burley Carpenter, Rand Etchison   Tue Sep 06, 2023 11:42 AM) Patient will be picking up corrected medication dosage from clinic today or tomorrow            Recommendation:   PCP Follow-up  Follow Up Plan:   Telephone follow up appointment date/time:  09/22/23 at 2:00   Clarnce Crow BSN RN CCM [place]  [facility], [facility] RN Care Manager Direct Dial: [phone] Fax: [phone]

## 2023-09-06 NOTE — Telephone Encounter (Signed)
 Ozempic delivered to office today. Patient notified via mychart.

## 2023-09-06 NOTE — Patient Instructions (Signed)
 Visit Information  Thank you for taking time to visit with me today. Please don't hesitate to contact me if I can be of assistance to you before our next scheduled appointment.  Our next appointment is by telephone on 09/22/23 at 2:00 Please call the care guide team at [phone] if you need to cancel or reschedule your appointment.   Following is a copy of your care plan:   Goals Addressed             This Visit's Progress    VBCI RN Care Plan       Problems:  Chronic Disease Management support and education needs related to DMII and HTN  Goal: Over the next 14 days the Patient will attend all scheduled medical appointments: 10/12/23 PCP 11/28/23 Oncology and CT scan as evidenced by chart review and patient report        continue to work with RN Care Manager and/or Social Worker to address care management and care coordination needs related to DMII and HTN as evidenced by adherence to care management team scheduled appointments     demonstrate Improved adherence to prescribed treatment plan for DMII as evidenced by taking daily fasting blood sugar readings and recording them in log book to review with provider and RNCM  Interventions:   Diabetes Interventions: Assessed patient's understanding of A1c goal: <6.5% Provided education to patient about basic DM disease process Reviewed medications with patient and discussed importance of medication adherence Counseled on importance of regular laboratory monitoring as prescribed Discussed plans with patient for ongoing care management follow up and provided patient with direct contact information for care management team Advised patient, providing education and rationale, to check cbg each morning before breakfast and record, calling PCP for findings outside established parameters Lab Results  Component Value Date   HGBA1C 8.2 (H) 07/19/2023    Patient Self-Care Activities:  Attend all scheduled provider appointments Call pharmacy  for medication refills 3-7 days in advance of running out of medications Call provider office for new concerns or questions  Perform all self care activities independently  Take medications as prescribed   schedule appointment with eye doctor check feet daily for cuts, sores or redness enter blood sugar readings and medication or insulin  into daily log take the blood sugar log to all doctor visits drink 6 to 8 glasses of water  each day fill half of plate with vegetables wash and dry feet carefully every day wear comfortable, cotton socks wear comfortable, well-fitting shoes  Plan:  Telephone follow up appointment with care management team member scheduled for:  12/23/23 at 2:00             Please call the Suicide and Crisis Lifeline: 988 call the [place]  National Suicide Prevention Lifeline: 647-239-8385 or TTY: 325-159-1419 TTY (650)821-1811) to talk to a trained counselor call 1-800-273-TALK (toll free, 24 hour hotline) if you are experiencing a Mental Health or Behavioral Health Crisis or need someone to talk to.  Patient verbalizes understanding of instructions and care plan provided today and agrees to view in MyChart. Active MyChart status and patient understanding of how to access instructions and care plan via MyChart confirmed with patient.      Clarnce Crow BSN RN CCM [place]  [facility], [facility] RN Care Manager Direct Dial: [phone] Fax: [phone]

## 2023-09-08 NOTE — Telephone Encounter (Signed)
 Pt arrived in office to pick up ozempic. Provided medication to patient

## 2023-09-22 ENCOUNTER — Other Ambulatory Visit: Payer: Self-pay

## 2023-09-22 ENCOUNTER — Telehealth: Payer: Self-pay | Admitting: Adult Health

## 2023-09-22 NOTE — Patient Instructions (Signed)
 Visit Information  Thank you for taking time to visit with me today. Please don't hesitate to contact me if I can be of assistance to you before our next scheduled appointment.  Our next appointment is by telephone on 10/13/23 at 2:00 Please call the care guide team at [phone] if you need to cancel or reschedule your appointment.   Following is a copy of your care plan:   Goals Addressed             This Visit's Progress    VBCI RN Care Plan       Problems:  Chronic Disease Management support and education needs related to DMII and HTN  Goal: Over the next 21 days the Patient will attend all scheduled medical appointments: 10/12/23 PCP 11/28/23 Oncology and CT scan as evidenced by chart review and patient report        continue to work with RN Care Manager and/or Social Worker to address care management and care coordination needs related to DMII and HTN as evidenced by adherence to care management team scheduled appointments     demonstrate Improved adherence to prescribed treatment plan for DMII as evidenced by taking daily fasting blood sugar readings and recording them in log book to review with provider and RNCM  Interventions:   Diabetes Interventions: Assessed patient's understanding of A1c goal: <6.5% Provided education to patient about basic DM disease process Reviewed medications with patient and discussed importance of medication adherence Counseled on importance of regular laboratory monitoring as prescribed Discussed plans with patient for ongoing care management follow up and provided patient with direct contact information for care management team Advised patient, providing education and rationale, to check cbg each morning before breakfast and record, calling PCP for findings outside established parameters Lab Results  Component Value Date   HGBA1C 8.2 (H) 07/19/2023    Patient Self-Care Activities:  Attend all scheduled provider appointments Call pharmacy  for medication refills 3-7 days in advance of running out of medications Call provider office for new concerns or questions  Perform all self care activities independently  Take medications as prescribed   schedule appointment with eye doctor check feet daily for cuts, sores or redness enter blood sugar readings and medication or insulin  into daily log take the blood sugar log to all doctor visits drink 6 to 8 glasses of water  each day fill half of plate with vegetables wash and dry feet carefully every day wear comfortable, cotton socks wear comfortable, well-fitting shoes  Plan:  Telephone follow up appointment with care management team member scheduled for:  10/13/23 at 2:00            Please call the Suicide and Crisis Lifeline: 988 call the [place]  National Suicide Prevention Lifeline: 402-469-6223 or TTY: 985-566-4683 TTY (734)664-7560) to talk to a trained counselor call 1-800-273-TALK (toll free, 24 hour hotline) if you are experiencing a Mental Health or Behavioral Health Crisis or need someone to talk to.  Patient verbalizes understanding of instructions and care plan provided today and agrees to view in MyChart. Active MyChart status and patient understanding of how to access instructions and care plan via MyChart confirmed with patient.     Clarnce Crow BSN RN CCM [place]  [facility], [facility] RN Care Manager Direct Dial: [phone] Fax: [phone]

## 2023-09-22 NOTE — Telephone Encounter (Signed)
 Noted.

## 2023-09-22 NOTE — Telephone Encounter (Signed)
 Pt called wanting to inform the provider that her machine is not working therefore the DME gave her a new one till further notice. She states that if there are any questions to please give her a call.

## 2023-09-22 NOTE — Patient Outreach (Signed)
 Complex Care Management   Visit Note  09/22/2023  Name:  Kelli Ruiz MRN: [id number] DOB: 1956/03/14  Situation: Referral received for Complex Care Management related to Diabetes with Complications I obtained verbal consent from Patient.  Visit completed with patient  on the phone  Background:   Past Medical History:  Diagnosis Date   Arthritis    Complication of anesthesia    " they had a little bit of a hard time waking me after they took a nerve out of my leg"    Depression    Diabetes mellitus without complication (HCC)    Hypertension    Neuromuscular disorder (HCC)    Obese 01/19/2017   RLS (restless legs syndrome)    Sleep apnea    Vertigo     Assessment: Patient Reported Symptoms:  Cognitive Cognitive Status: Alert and oriented to person, place, and time      Neurological Neurological Review of Symptoms: No symptoms reported    HEENT HEENT Symptoms Reported: Runny nose, Sore throat (patient attributes to seasonal allergies) HEENT Management Strategies: Medication therapy, Adequate rest, Fluid modification HEENT Self-Management Outcome: 4 (good)    Cardiovascular Cardiovascular Symptoms Reported: No symptoms reported    Respiratory Respiratory Symptoms Reported: No symptoms reported Other Respiratory Symptoms: wheezing improved with use of albuterol  Respiratory Conditions: Seasonal allergies Respiratory Self-Management Outcome: 4 (good)  Endocrine Patient reports the following symptoms related to hypoglycemia or hyperglycemia : No symptoms reported Is patient diabetic?: Yes Is patient checking blood sugars at home?: No Endocrine Conditions: Diabetes Endocrine Management Strategies: Weight management, Medical device, Medication therapy, Routine screening, Diet modification  Gastrointestinal Gastrointestinal Symptoms Reported: No symptoms reported   Nutrition Risk Screen (CP): No indicators present  Genitourinary Genitourinary Symptoms Reported: No symptoms  reported    Integumentary Integumentary Symptoms Reported: Skin changes Additional Integumentary Details: bottom of left foot, will have PCP examine at visit 10/12/23 Skin Conditions: Other Skin Management Strategies: Routine screening  Musculoskeletal Musculoskelatal Symptoms Reviewed: Muscle pain (chronic knee pain, no changes) Musculoskeletal Conditions: Joint pain Musculoskeletal Management Strategies: Coping strategies, Medication therapy, Routine screening, Weight management Musculoskeletal Self-Management Outcome: 3 (uncertain) Falls in the past year?: No    Psychosocial Psychosocial Symptoms Reported: No symptoms reported            08/09/2023   11:32 AM  Depression screen PHQ 2/9  Decreased Interest 0  Down, Depressed, Hopeless 0  PHQ - 2 Score 0    There were no vitals filed for this visit.  Medications Reviewed Today     Reviewed by Clarnce Crow, RN (Registered Nurse) on 09/22/23 at 1418  Med List Status: <None>   Medication Order Taking? Sig Documenting Provider Last Dose Status Informant  ACCU-CHEK GUIDE test strip [id number]  USE TO TEST BLOOD SUGAR ONCE DAILY AS DIRECTED Catheryn Cluck, MD  Active            Med Note Burley Carpenter, Rihana Kiddy   Tue Sep 06, 2023 11:39 AM) Patient to refill has not been taking BS readings recently  Accu-Chek Softclix Lancets lancets [id number] Yes USE TO TEST BLOOD SUGAR DAILY AS DIRECTED Catheryn Cluck, MD Taking Active   albuterol  (VENTOLIN  HFA) 108 [id number] Base) MCG/ACT inhaler [id number]  Inhale 2 puffs into the lungs every 6 (six) hours as needed for wheezing or shortness of breath. Catheryn Cluck, MD  Active   amLODipine  (NORVASC ) 5 MG tablet [id number]  Take 1 tablet (5 mg total) by mouth daily. Catheryn Cluck, MD  Active   blood glucose meter kit and supplies [id number]  Dispense based on patient and insurance preference. Use up to four times daily as directed. (FOR ICD-10 E10.9, E11.9). Catheryn Cluck, MD  Active   cetirizine   (ALLERGY, CETIRIZINE ,) 10 MG tablet [id number] No Take 1 tablet (10 mg total) by mouth daily.  Patient not taking: Reported on 09/22/2023   Catheryn Cluck, MD Not Taking Active   citalopram  (CELEXA ) 40 MG tablet [id number] Yes TAKE 1 TABLET BY MOUTH ONCE DAILY Catheryn Cluck, MD Taking Active   diclofenac  Sodium (VOLTAREN ) 1 % GEL [id number] Yes Apply 4 g topically 4 (four) times daily as needed. Catheryn Cluck, MD Taking Active   FARXIGA  5 MG TABS tablet [id number] Yes Take 5 mg by mouth daily. [provider] Taking Active            Med Note Finley Hugh, CHERYL A   Wed Aug 10, 2023  5:33 PM) Working on PAP  fluticasone  (FLONASE ) 50 MCG/ACT nasal spray [id number]  Place 2 sprays into both nostrils daily as needed for allergies or rhinitis. Angelia Kelp, PA-C  Active   gabapentin  (NEURONTIN ) 600 MG tablet [id number]  TAKE 1 TABLET BY MOUTH AT BREAKFAST AND AT BEDTIME Catheryn Cluck, MD  Active   hydroxychloroquine  (PLAQUENIL ) 200 MG tablet [id number] No TAKE 1 TABLET BY MOUTH TWICE DAILY, MONDAY THROUGH FRIDAY. NONE ON SATURDAY OR SUNDAY.  Patient not taking: Reported on 09/22/2023   Romayne Clubs, PA-C Not Taking Active   pioglitazone  (ACTOS ) 15 MG tablet [id number]  Take 1 tablet (15 mg total) by mouth daily. Catheryn Cluck, MD  Active   polyethylene glycol powder (GLYCOLAX /MIRALAX ) 17 GM/SCOOP powder [id number] Yes Take 17 g by mouth daily. Catheryn Cluck, MD Taking Active   rosuvastatin  (CRESTOR ) 10 MG tablet [id number] Yes TAKE 1 TABLET BY MOUTH EVERY DAY AT BEDTIME Catheryn Cluck, MD Taking Active   Semaglutide,0.25 or 0.5MG /DOS, (OZEMPIC, 0.25 OR 0.5 MG/DOSE,) 2 MG/3ML SOPN [id number] Yes Inject into the skin. Inject 0.25mg  weekly x4 weeks, then increase to 0.5mg  weekly [provider] Taking Active            Med Note Burley Carpenter, Damaria Vachon   Tue Sep 06, 2023 11:42 AM) Patient will be picking up corrected medication dosage from clinic today or tomorrow             Recommendation:   PCP Follow-up appt 10/12/23. Patient will take fasting blood sugar readings to appointment.  Patient will have PCP complete foot exam, due to changes noted  Follow Up Plan:   Telephone follow up appointment date/time:  10/13/23 at 2:00   Clarnce Crow BSN RN CCM [place]  [facility], [facility] RN Care Manager Direct Dial: [phone] Fax: [phone]

## 2023-09-29 ENCOUNTER — Other Ambulatory Visit: Payer: Self-pay

## 2023-09-29 NOTE — Progress Notes (Signed)
   09/29/2023  Patient ID: Kelli Ruiz, female   DOB: 1955-12-30, 68 y.o.   MRN: [id number]  Subjective/Objective Telephone visit to follow-up on management of diabetes   Diabetes Management Plan -Current medications:  Farxiga  5mg  daily, pioglitazone  15mg  daily, Ozempic 0.25mg  weekly -Patient unable to tolerate 10mg  of Farxiga  in the past due to excessive urination -Recently received Ozempic 0.25/0.5mg  from [employer] [facility] and took 1st dose of 0.25mg  Saturday 5/10 -Patient has home testing supplies but has not yet started checking BG readings -Last A1c elevated at 8.2%   Assessment/Plan   Diabetes Management Plan -Continue current regimen at this time:  Ozempic 0.25mg  weekly, pioglitazone  15mg  daily, Farxiga  5mg  daily -Patient to begin monitoring/recording FBG regularly- will bring readings to upcoming PCP visit 5/28 -Will have had 3 doses of Ozempic 0.25mg  by next PCP visit.  If patient continues to tolerate well, I recommend increasing to 0.5mg  weekly after 4th week of 0.25mg  weekly -Goal to titrate Ozempic dosing to be able to stop pioglitazone  in the future   Follow-up:  6/26   Linn Rich, PharmD, DPLA

## 2023-10-01 IMAGING — CR DG LUMBAR SPINE COMPLETE 4+V
5 series · 5 of 5 positions shown · non-contrast
Comparison: None.

CLINICAL DATA: Left posterior hip pain, low back pain

EXAM:
LUMBAR SPINE - COMPLETE 4+ VIEW

[w lumbar spine ap]
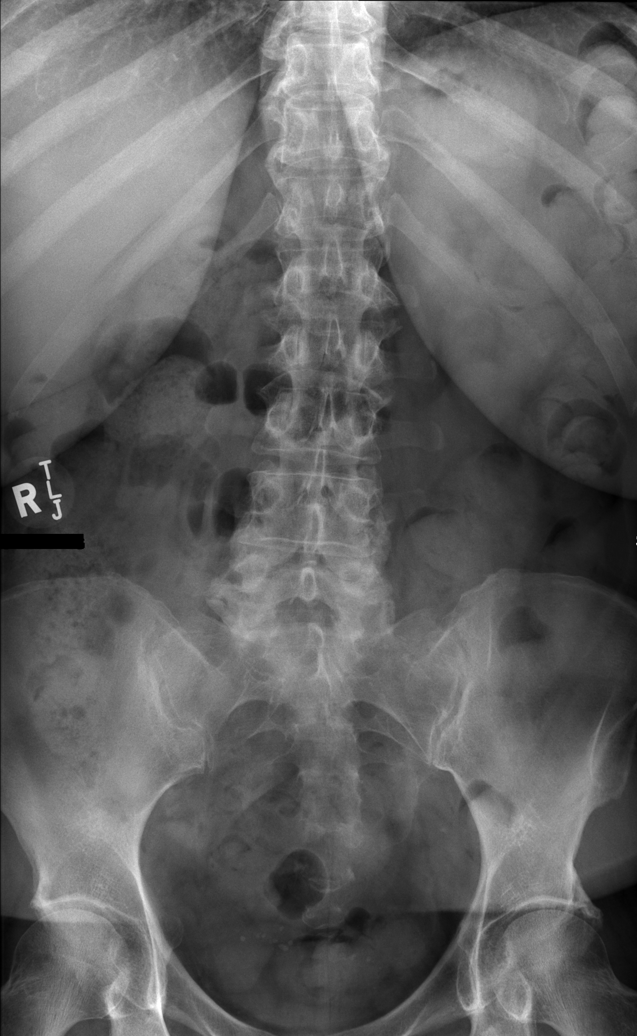

[w lumbar spine obl (1 of 2)]
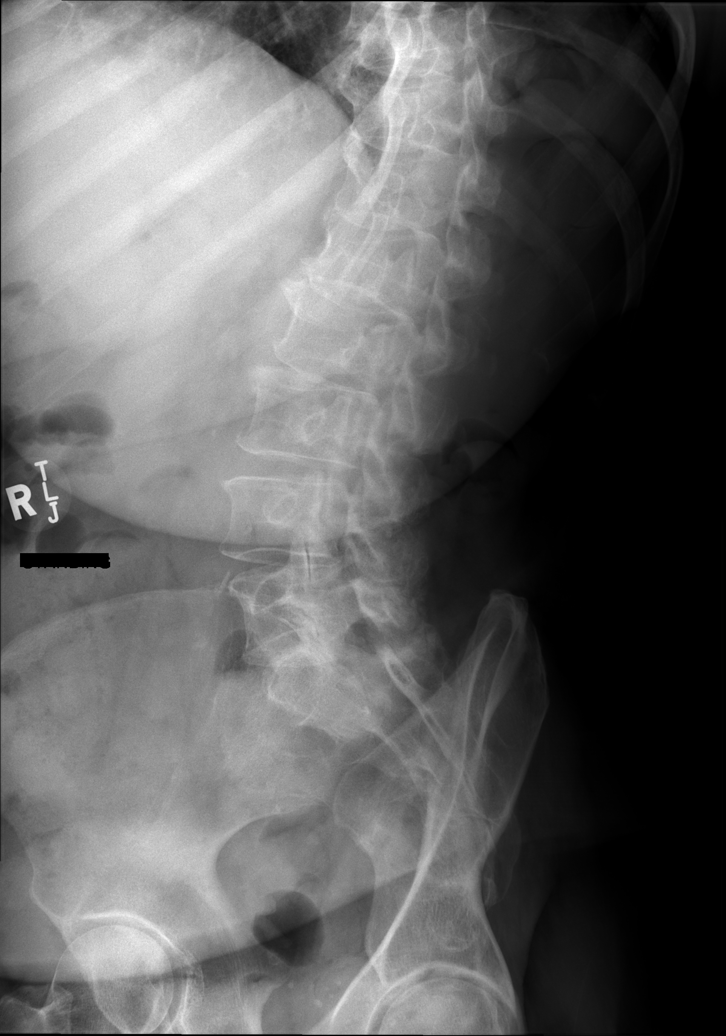

[w lumbar spine obl (2 of 2)]
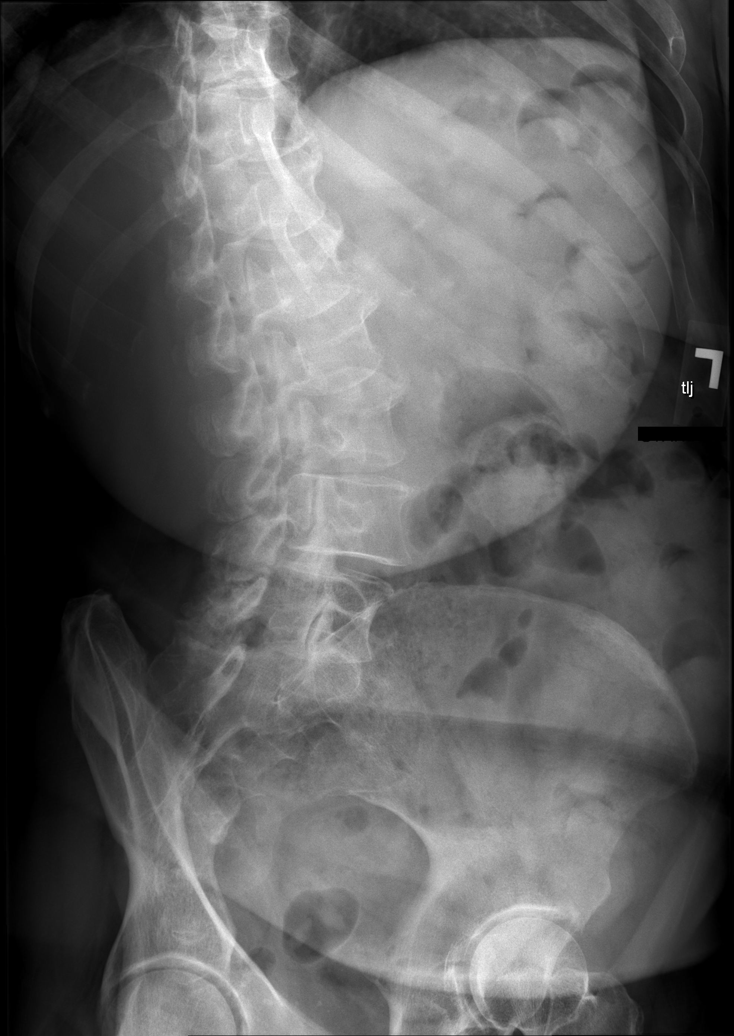

[w lumbar spine lat]
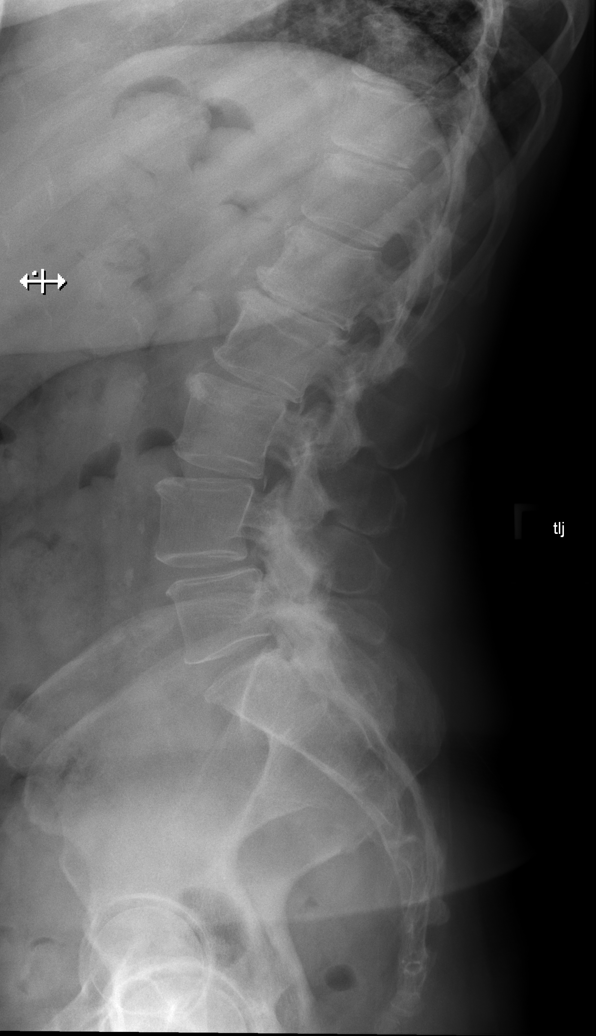

[w lumbar l-5 s-1 spot]
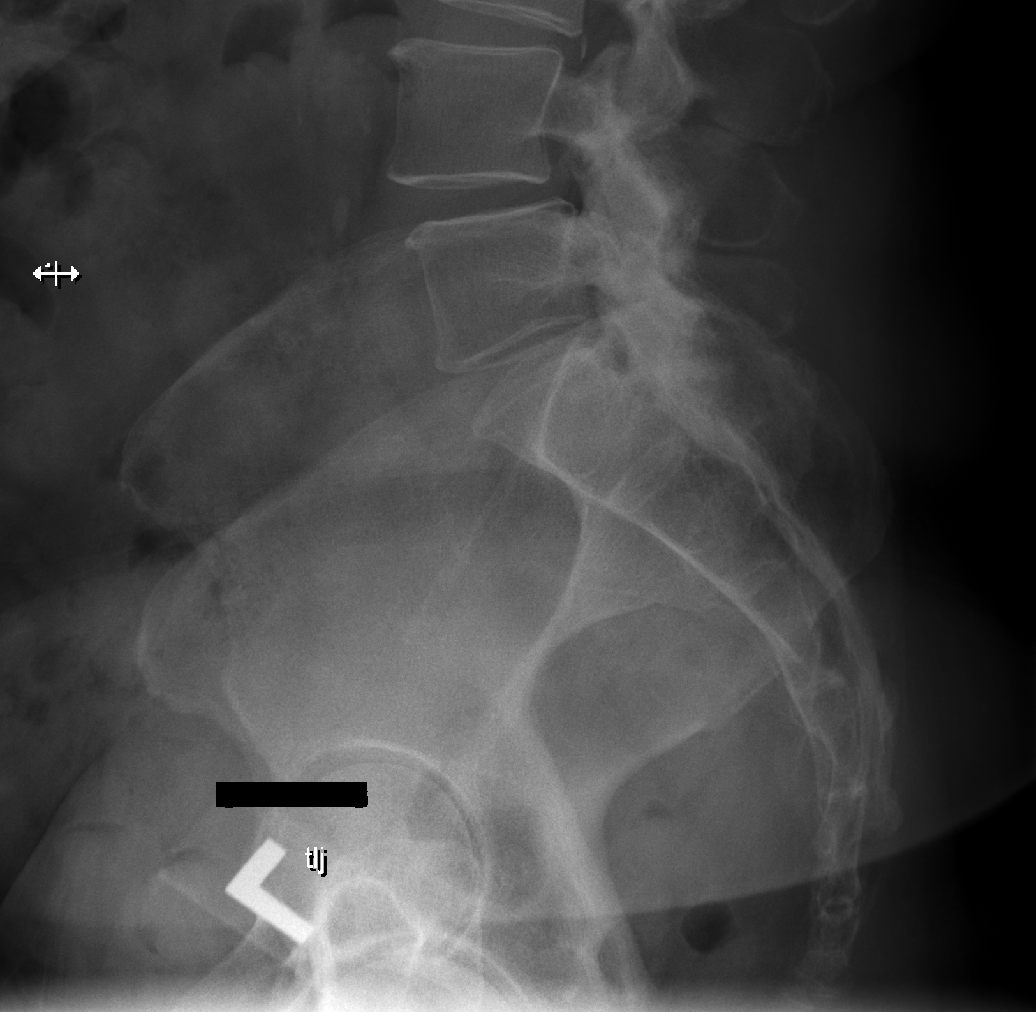

[5 of 5 positions shown; findings below may reference images not displayed]

FINDINGS: Frontal, bilateral oblique, lateral views of the lumbar spine are
obtained. There are 5 non-rib-bearing lumbar type vertebral bodies
identified. There is mild retrolisthesis of L1 on L2 and L2 on L3.
Mild grade 1 anterolisthesis of L4 on L5. Multilevel spondylosis
greatest at L1-2 and L2-3. Diffuse facet hypertrophy greatest at
L4-5 and L5-S1. No fractures. Sacroiliac joints are unremarkable.
IMPRESSION: 1. Multilevel lumbar spondylosis and facet hypertrophy as above. No
acute fracture.

## 2023-10-11 ENCOUNTER — Ambulatory Visit: Payer: Medicare Other | Admitting: Family Medicine

## 2023-10-12 ENCOUNTER — Ambulatory Visit: Admitting: Family Medicine

## 2023-10-13 ENCOUNTER — Telehealth: Payer: Self-pay

## 2023-10-21 ENCOUNTER — Telehealth: Payer: Self-pay

## 2023-10-21 NOTE — Progress Notes (Addendum)
 Complex Care Management Care Guide Note  10/21/2023 Name: RMONI KEPLINGER MRN: [id number] DOB: [birth year]  Penny Boxer Moller is a 68 y.o. year old female who is a primary care patient of Catheryn Cluck, MD and is actively engaged with the care management team. I reached out to Shervon A Quackenbush by phone today to assist with re-scheduling  with the RN Case Manager.  Follow up plan: Patient plans to follow up directly with RNCM.  [facility]  [facility], [facility] Health Care Management Assistant Direct Dial: [phone]  Fax: [phone]

## 2023-11-03 ENCOUNTER — Encounter: Payer: Self-pay | Admitting: Family Medicine

## 2023-11-03 ENCOUNTER — Ambulatory Visit: Admitting: Family Medicine

## 2023-11-03 VITALS — BP 110/70 | HR 79 | Temp 97.9°F | Ht 70.0 in | Wt 151.4 lb

## 2023-11-03 DIAGNOSIS — Z23 Encounter for immunization: Secondary | ICD-10-CM | POA: Diagnosis not present

## 2023-11-03 DIAGNOSIS — E119 Type 2 diabetes mellitus without complications: Secondary | ICD-10-CM

## 2023-11-03 DIAGNOSIS — Z7984 Long term (current) use of oral hypoglycemic drugs: Secondary | ICD-10-CM | POA: Diagnosis not present

## 2023-11-03 DIAGNOSIS — R911 Solitary pulmonary nodule: Secondary | ICD-10-CM | POA: Diagnosis not present

## 2023-11-03 DIAGNOSIS — B070 Plantar wart: Secondary | ICD-10-CM | POA: Diagnosis not present

## 2023-11-03 DIAGNOSIS — Z1231 Encounter for screening mammogram for malignant neoplasm of breast: Secondary | ICD-10-CM

## 2023-11-03 DIAGNOSIS — Z7985 Long-term (current) use of injectable non-insulin antidiabetic drugs: Secondary | ICD-10-CM | POA: Diagnosis not present

## 2023-11-03 LAB — POCT GLYCOSYLATED HEMOGLOBIN (HGB A1C): Hemoglobin A1C: 6.9 % — AB (ref 4.0–5.6)

## 2023-11-03 MED ORDER — OZEMPIC (0.25 OR 0.5 MG/DOSE) 2 MG/3ML ~~LOC~~ SOPN: 0.5000 mg | PEN_INJECTOR | SUBCUTANEOUS | 3 refills | Status: DC

## 2023-11-03 MED ORDER — FARXIGA 5 MG PO TABS: 5.0000 mg | ORAL_TABLET | Freq: Every day | ORAL | 3 refills | Status: AC

## 2023-11-03 NOTE — Patient Instructions (Addendum)
  VISIT SUMMARY: During your visit, we addressed several health concerns including a lesion on your left foot, your diabetes management, and general health maintenance. We also discussed your rheumatoid arthritis and the need for routine screenings.  YOUR PLAN: -PLANTAR WART: You have a lesion on the bottom of your left foot, which is likely a plantar wart. Plantar warts are growths that appear on the heels or other weight-bearing areas of your feet. We performed cryotherapy to treat the wart and recommend using over-the-counter 40% salicylic acid after the treatment. You should soak your foot, use a pumice stone, apply the salicylic acid, and cover it with duct tape. We also suggest a follow-up with dermatology for further evaluation and management. If symptoms persist, we may consider an x-ray to rule out a foreign body.  -TYPE 2 DIABETES MELLITUS: Your diabetes is well-controlled with an A1c of 6.9%. We discussed stopping pioglitazone  and increasing your Ozempic dose to 0.5 mg weekly for better control and additional benefits for your kidneys and heart. You should continue taking Farxiga  5 mg daily. We will follow up in 3 months to monitor your diabetes management.  -RHEUMATOID ARTHRITIS: Your rheumatoid arthritis is currently managed with hydroxychloroquine . No changes were made to this treatment today.  -GENERAL HEALTH MAINTENANCE: You are due for a tetanus booster vaccine since your last shot was in 2012. It is important to stay up to date on vaccinations, especially with your potential foreign body exposure and immunocompromised status due to diabetes and rheumatoid arthritis. You are also due for a mammogram and an eye exam for routine screening.  INSTRUCTIONS: Please follow up with dermatology for further evaluation of your plantar wart. Schedule an appointment for a mammogram and an eye exam. We will see you again in 3 months to monitor your diabetes management.  For left foot xray, go to:     [facility] [address], [place], [place] [address] Phone: [phone]

## 2023-11-03 NOTE — Progress Notes (Addendum)
 Assessment & Plan   Assessment/Plan:     Assessment & Plan Plantar Wart Lesion on the bottom of the left foot, likely a plantar wart. Differential diagnosis includes a foreign body. The lesion is not painful but noticeable. Discussed the potential for plantar warts to be resistant to treatment and the possibility of them increasing in size. Cryotherapy is recommended as an initial treatment, with the understanding that it may not completely resolve the wart. Discussed risk and benefit. Patient provided verbal consent. Cryotherapy was applied in the standard fashion. Partial destruction of the lesion occurred. No blood loss. Patient tolerated well. Salicylic acid and duct tape application are suggested as adjunctive treatments. Dermatology follow-up is advised for further management. - Perform cryotherapy on the plantar wart - Advise use of over-the-counter 40% salicylic acid after cryotherapy - Instruct to soak foot, use pumice stone, apply salicylic acid, and cover with duct tape - Refer to dermatology for further evaluation and management - Consider x-ray if symptoms persist to rule out foreign body  Type 2 Diabetes Mellitus Diabetes is well-controlled with an A1c of 6.9%. Current treatment includes Ozempic , Farxiga , and pioglitazone . Reports morning blood sugar levels around 110 mg/dL. Experiences tremors when blood sugar drops during the day. Discussed stopping pioglitazone  and increasing Ozempic  to 0.5 mg for better control and additional benefits such as kidney and heart protection. The combination of Farxiga  and increased Ozempic  is less likely to cause hypoglycemia. - Discontinue pioglitazone  15 mg daily - Increase Ozempic  to 0.5 mg weekly - Continue Farxiga  5 mg daily - Follow up in 3 months for diabetes management  Rheumatoid Arthritis Currently managed with hydroxychloroquine .  General Health Maintenance Tetanus vaccination is due as the last shot was in 2012. Discussed the  importance of being up to date on vaccinations, especially with potential foreign body exposure and immunocompromised status due to diabetes and rheumatoid arthritis. Mammogram and eye exam are due for routine screening. - Administer tetanus booster vaccine - Refer for mammogram - Schedule eye exam with ophthalmology      Medications Discontinued During This Encounter  Medication Reason   pioglitazone  (ACTOS ) 15 MG tablet    FARXIGA  5 MG TABS tablet Reorder   Semaglutide ,0.25 or 0.5MG /DOS, (OZEMPIC , 0.25 OR 0.5 MG/DOSE,) 2 MG/3ML SOPN Reorder    Return in about 3 months (around 02/03/2024) for DM.        Subjective:   Encounter date: 11/03/2023  Kelli Ruiz is a 68 y.o. female who has S/P conversion right knee; Diabetes mellitus (HCC); Hypertension associated with diabetes (HCC); Hyperlipidemia associated with type 2 diabetes mellitus (HCC); Tremor; OSA (obstructive sleep apnea); Swelling of both hands; Excessive daytime sleepiness; OSA on CPAP; Non-restorative sleep; RLS (restless legs syndrome); Bilateral lower extremity edema; Vertigo; Caregiver stress; Ocular myokymia; Rheumatoid arthritis involving multiple sites [facility]); MDD (major depressive disorder); Urinary frequency; Multiple nevi; Chest congestion; Vaginal atrophy; Sarcopenia; Positive colorectal cancer screening using Cologuard test; Chronic idiopathic constipation; Erythrocytosis; and Thrombocytopenia (HCC) on their problem list..   She  has a past medical history of Arthritis, Complication of anesthesia, Depression, Diabetes mellitus without complication (HCC), Hypertension, Neuromuscular disorder (HCC), Obese (01/19/2017), RLS (restless legs syndrome), Sleep apnea, and Vertigo..   She presents with chief complaint of Follow-up (Would like left foot looked at) and Allergies .   Discussed the use of AI scribe software for clinical note transcription with the patient, who gave verbal consent to proceed.  History of  Present Illness Kelli Ruiz is a 68 year old  female with diabetes who presents with concerns about a lesion on her left foot.  She has a lesion on the bottom of her left foot, initially thought to be caused by debris in her shoe while walking her dog. The sensation is described as feeling like a rock under the skin, present for approximately four weeks. The lesion is not consistently painful but can be felt when stepping in certain ways, such as in the shower. She is unsure of the nature of the lesion.  Her diabetes is well-controlled with an A1c of 6.9%. She is on a regimen that includes Ozempic , Farxiga , and pioglitazone . Morning blood sugar levels are around 110 mg/dL. She experiences occasional hand tremors, particularly in her right hand, associated with low blood sugar episodes during the day. She has not yet increased her Ozempic  dose to 0.5 mg.  She is on hydroxychloroquine  for rheumatoid arthritis. Her last tetanus shot was in 2012.  She has a history of a lung nodule and reports that her recent PET scan was okay. She is scheduled for a follow-up at the cancer center next month.  She is due for a mammogram and an eye exam, having missed the recent mobile mammogram unit visit. She also inquires about cosmetic concerns regarding varicose veins, which are not currently causing any ulcers or significant symptoms.  She walks her Cocker Spaniel regularly and has a supportive family. Her Cocker Camilla has been diagnosed with an enlarged heart and previously had cancer.     ROS  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;  Surgeon: Ernie Cough, MD;  Location: [facility];  Service: Orthopedics;  Laterality: Right;  90 mins with regional block   DILATION AND CURETTAGE OF UTERUS  12- 2014 or 2015   TONSILLECTOMY      unicompartmental knee  2016    Outpatient Medications Prior to Visit  Medication Sig Dispense Refill   ACCU-CHEK GUIDE test strip USE TO TEST BLOOD SUGAR ONCE DAILY AS DIRECTED 50 each 10   Accu-Chek Softclix Lancets lancets USE TO TEST BLOOD SUGAR DAILY AS DIRECTED 100 each 10   albuterol  (VENTOLIN  HFA) 108 (90 Base) MCG/ACT inhaler Inhale 2 puffs into the lungs every 6 (six) hours as needed for wheezing or shortness of breath. 8 g 0   amLODipine  (NORVASC ) 5 MG tablet Take 1 tablet (5 mg total) by mouth daily. 90 tablet 3   blood glucose meter kit and supplies Dispense based on patient and insurance preference. Use up to four times daily as directed. (FOR ICD-10 E10.9, E11.9). 1 each 0   cetirizine  (ALLERGY, CETIRIZINE ,) 10 MG tablet Take 1 tablet (10 mg total) by mouth daily. 90 tablet 1   citalopram  (CELEXA ) 40 MG tablet TAKE 1 TABLET BY MOUTH ONCE DAILY 30 tablet 10   diclofenac  Sodium (VOLTAREN ) 1 % GEL Apply 4 g topically 4 (four) times daily as needed. 100 g 3   fluticasone  (FLONASE ) 50 MCG/ACT nasal spray Place 2 sprays into both nostrils daily as needed for allergies or rhinitis. 16 g 3   gabapentin  (NEURONTIN ) 600 MG tablet TAKE 1 TABLET BY MOUTH AT BREAKFAST AND AT BEDTIME 60 tablet 10   hydroxychloroquine  (PLAQUENIL ) 200 MG tablet TAKE 1 TABLET BY MOUTH TWICE DAILY, MONDAY THROUGH FRIDAY. NONE ON SATURDAY OR SUNDAY. 40 tablet 0   polyethylene glycol powder (GLYCOLAX /MIRALAX )  17 GM/SCOOP powder Take 17 g by mouth daily. 500 g 5   rosuvastatin  (CRESTOR ) 10 MG tablet TAKE 1 TABLET BY MOUTH EVERY DAY AT BEDTIME 30 tablet 10   FARXIGA  5 MG TABS tablet Take 5 mg by mouth daily.     pioglitazone  (ACTOS ) 15 MG tablet Take 1 tablet (15 mg total) by mouth daily. 90 tablet 3   Semaglutide ,0.25 or 0.5MG /DOS, (OZEMPIC , 0.25 OR 0.5 MG/DOSE,) 2 MG/3ML SOPN Inject into the skin. Inject 0.25mg  weekly x4 weeks, then increase to 0.5mg  weekly     No facility-administered medications prior to visit.     Family History  Problem Relation Age of Onset   Diabetes Mother    Heart attack Mother    Kidney disease Mother    Arthritis Father    Stroke Father    Hypertension Father    Cancer Sister    Dementia Sister    Schizophrenia Sister    Breast cancer Neg Hx    Sleep apnea Neg Hx     Social History   Socioeconomic History   Marital status: Single    Spouse name: Not on file   Number of children: Not on file   Years of education: Not on file   Highest education level: Not on file  Occupational History   Not on file  Tobacco Use   Smoking status: Former    Current packs/day: 0.00    Types: Cigarettes    Start date: 01/09/1997    Quit date: 01/09/2017    Years since quitting: 6.8    Passive exposure: Past   Smokeless tobacco: Former   Tobacco comments:    Has stopped vaping now  Advertising account planner   Vaping status: Former  Substance and Sexual Activity   Alcohol use: No   Drug use: No   Sexual activity: Not on file  Other Topics Concern   Not on file  Social History Narrative   Pt lives with roommate    Retired    Chief Executive Officer Drivers of Corporate investment banker Strain: Low Risk  (02/28/2023)   Overall Financial Resource Strain (CARDIA)    Difficulty of Paying Living Expenses: Not hard at all  Food Insecurity: No Food Insecurity (09/22/2023)   Hunger Vital Sign    Worried About Running Out of Food in the Last Year: Never true    Ran Out of Food in the Last Year: Never true  Transportation Needs: No Transportation Needs (09/22/2023)   PRAPARE - Administrator, Civil Service (Medical): No    Lack of Transportation (Non-Medical): No  Physical Activity: Insufficiently Active (02/28/2023)   Exercise Vital Sign    Days of Exercise per Week: 7 days    Minutes of Exercise per Session: 20 min  Stress: No Stress Concern Present (02/28/2023)   [employer] of Occupational Health - Occupational Stress Questionnaire    Feeling of Stress : Not at all  Social  Connections: Socially Isolated (02/28/2023)   Social Connection and Isolation Panel    Frequency of Communication with Friends and Family: Three times a week    Frequency of Social Gatherings with Friends and Family: Once a week    Attends Religious Services: Never    Database administrator or Organizations: No    Attends Banker Meetings: Never    Marital Status: Never married  Intimate Partner Violence: Not At Risk (09/22/2023)   Humiliation, Afraid, Rape, and Kick questionnaire    Fear of  Current or Ex-Partner: No    Emotionally Abused: No    Physically Abused: No    Sexually Abused: No                                                                                                  Objective:  Physical Exam: BP 110/70   Pulse 79   Temp 97.9 F (36.6 C) (Temporal)   Ht 5' 10 (1.778 m)   Wt 151 lb 6.4 oz (68.7 kg)   SpO2 99%   BMI 21.72 kg/m    Physical Exam MEASUREMENTS: BMI- 21.0. GENERAL: Alert, cooperative, well developed, no acute distress. HEENT: Normocephalic, normal oropharynx, moist mucous membranes. CHEST: Clear to auscultation bilaterally, no wheezes, rhonchi, or crackles. CARDIOVASCULAR: Normal heart rate and rhythm, S1 and S2 normal without murmurs. ABDOMEN: Soft, non-tender, non-distended, without organomegaly, normal bowel sounds. EXTREMITIES: No cyanosis or edema. Plantar wart on the bottom of the left foot. NEUROLOGICAL: Cranial nerves grossly intact, moves all extremities without gross motor or sensory deficit.   Physical Exam  NM PET Image Initial (PI) Skull Base To Thigh Result Date: 08/26/2023 CLINICAL DATA:  Initial treatment strategy for lung nodule. EXAM: NUCLEAR MEDICINE PET SKULL BASE TO THIGH TECHNIQUE: 7.7 mCi F-18 FDG was injected intravenously. Full-ring PET imaging was performed from the skull base to thigh after the radiotracer. CT data was obtained and used for attenuation correction and anatomic localization. Fasting blood  glucose: 143 mg/dl COMPARISON:  CT 96/75/7974 and ultrasound abdomen FINDINGS: Mediastinal blood pool activity: SUV max 3.0 Liver activity: SUV max 3.6 NECK: No specific abnormal radiotracer uptake seen in the neck including along lymph node change of the submandibular, posterior triangle or internal jugular regions. There is symmetric uptake seen along the visualized portions of the intracranial compartment. Incidental CT findings: Paranasal sinuses and mastoid air cells are clear. The parotid glands, submandibular glands are unremarkable. Small thyroid  gland. CHEST: No specific abnormal radiotracer uptake identified along the axillary regions, hilum or mediastinum. No abnormal parenchymal areas of uptake identified. There is a nodular area on the prior CT scan along the anterior aspect of the right lower lobe superiorly near the margin of the major fissure. This area is seen today on CT images seventy-nine. Dimension today is 15 x 7 mm and previously 15 x 7 mm. This is semi-solid. Again no abnormal uptake in this location. SUV of the area maximum is 1.4. This would have a differential. There is slight uptake along the lower esophagus, nonspecific. There is a small hiatal hernia. Incidental CT findings: Heart is nonenlarged. No pericardial effusion. Scattered vascular calcifications along the thoracic aorta. Lungs are without consolidation, pneumothorax or effusion. Slight mosaic ground-glass. Diffuse breathing motion. ABDOMEN/PELVIS: No specific abnormal uptake identified. Physiologic distribution radiotracer along the parenchymal organs, bowel and renal collecting systems. Only minimal uptake along the upper portion of the stomach, nonspecific. Please correlate with any symptoms. Incidental CT findings: Scattered vascular calcifications are identified. No obstructing renal or ureteral stones are seen. Mild bilateral renal atrophy. Uterus is present. Underdistended urinary bladder. The bowel overall has a normal  course  and caliber. Moderate diffuse colonic stool. Normal appendix in the right lower quadrant. Gallbladder is present. Stable slight nodularity to the left adrenal gland. Minimal uptake of maximum SUV 3.4. Hounsfield unit on the noncontrast CT of 8. SKELETON: No abnormal uptake along the visualized osseous structures. Slight curvature of the spine with scattered degenerative changes. Areas of canal encroachment along the lumbar spine related to the degenerative changes. IMPRESSION: Semi-solid ill-defined nodular area in the superior segment right lower lobe is stable today by CT but does not show any abnormal uptake. Would recommend follow-up imaging in 3 months with CT scan for further surveillance. Left adrenal nodules again seen as on prior examination. On the noncontrast CT this has Hounsfield unit of 8 and has minimal uptake. Suggestive of an adenoma. No additional areas of abnormal radiotracer uptake at this time. Electronically Signed   By: Ranell Bring M.D.   On: 08/26/2023 16:13   CT CHEST ABDOMEN PELVIS W CONTRAST Result Date: 08/14/2023 CLINICAL DATA:  Unexplained weight loss, upper abdominal pain, constipation, fatigue, new thrombocytopenia, colon polyps * Tracking Code: BO * EXAM: CT CHEST, ABDOMEN, AND PELVIS WITH CONTRAST TECHNIQUE: Multidetector CT imaging of the chest, abdomen and pelvis was performed following the standard protocol during bolus administration of intravenous contrast. RADIATION DOSE REDUCTION: This exam was performed according to the departmental dose-optimization program which includes automated exposure control, adjustment of the mA and/or kV according to patient size and/or use of iterative reconstruction technique. CONTRAST:  OMNIPAQUE  IOHEXOL  300 MG/ML  SOLN COMPARISON:  None Available. FINDINGS: CT CHEST FINDINGS Cardiovascular: Aortic atherosclerosis. Normal heart size. No pericardial effusion. Mediastinum/Nodes: No enlarged mediastinal, hilar, or axillary lymph  nodes. Thyroid  gland, trachea, and esophagus demonstrate no significant findings. Lungs/Pleura: Irregular nodule of the peripheral right upper lobe measuring 1.5 x 0.7 cm (series 302, image 67). No pleural effusion or pneumothorax. Musculoskeletal: No chest wall abnormality. No acute osseous findings. CT ABDOMEN PELVIS FINDINGS Hepatobiliary: No solid liver abnormality is seen. Hepatic steatosis. No gallstones, gallbladder wall thickening, or biliary dilatation. Pancreas: Unremarkable. No pancreatic ductal dilatation or surrounding inflammatory changes. Spleen: Normal in size without significant abnormality. Adrenals/Urinary Tract: Soft tissue attenuation left adrenal nodule measuring 1.4 x 0.9 cm (series 301, image 52). Normal right adrenal. Kidneys are normal, without renal calculi, solid lesion, or hydronephrosis. Bladder is unremarkable. Stomach/Bowel: Stomach is within normal limits. Appendix not clearly visualized. No evidence of bowel wall thickening, distention, or inflammatory changes. Moderate burden of stool throughout the colon and rectum. Vascular/Lymphatic: No significant vascular findings are present. No enlarged abdominal or pelvic lymph nodes. Reproductive: No mass or other abnormality. Other: No abdominal wall hernia or abnormality. No ascites. Musculoskeletal: No acute osseous findings. IMPRESSION: 1. Irregular nodule of the peripheral right upper lobe measuring 1.5 x 0.7 cm. Although nonspecific and possibly infectious or inflammatory, this is concerning for primary lung malignancy. Recommend multidisciplinary thoracic referral for consideration of: (A) repeat chest CT, (b) follow-up PET-CT, or (c) tissue sampling. This recommendation follows the consensus statement: Guidelines for Management of Incidental Pulmonary Nodules Detected on CT Images: From the Fleischner Society 2017; Radiology 2017; 284:228-243. 2. Soft tissue attenuation left adrenal nodule measuring 1.4 x 0.9 cm, nonspecific but  statistically most likely an incidental, benign left adrenal adenoma. However, adrenal metastasis difficult to strictly exclude in the setting of a suspicious lung nodule. This could be characterized for metabolic activity by PET-CT or alternately for macroscopic fat content by MR. 3. Hepatic steatosis. 4. Moderate burden of stool. 5.  No acute findings to explain abdominal pain. These results will be called to the ordering clinician or representative by the Radiologist Assistant, and communication documented in the PACS or [employer]. Aortic Atherosclerosis (ICD10-I70.0). Electronically Signed   By: Marolyn JONETTA Jaksch M.D.   On: 08/14/2023 15:18   US  Abdomen Limited RUQ (LIVER/GB) Result Date: 08/08/2023 CLINICAL DATA:  Elevated liver function tests. EXAM: ULTRASOUND ABDOMEN LIMITED RIGHT UPPER QUADRANT COMPARISON:  None Available. FINDINGS: Gallbladder: No gallstones or wall thickening visualized. No sonographic Murphy sign noted by sonographer. Common bile duct: Diameter: 2 mm. Liver: No focal lesion identified. Within normal limits in parenchymal echogenicity. Portal vein is patent on color Doppler imaging with normal direction of blood flow towards the liver. Other: None. IMPRESSION: Normal right upper quadrant ultrasound. Electronically Signed   By: Craig Farr M.D.   On: 08/08/2023 16:22    Recent Results (from the past 2160 hours)  Glucose, capillary     Status: Abnormal   Collection Time: 08/22/23  2:40 PM  Result Value Ref Range   Glucose-Capillary 143 (H) 70 - 99 mg/dL    Comment: Glucose reference range applies only to samples taken after fasting for at least 8 hours.  POCT glycosylated hemoglobin (Hb A1C)     Status: Abnormal   Collection Time: 11/03/23 11:16 AM  Result Value Ref Range   Hemoglobin A1C 6.9 (A) 4.0 - 5.6 %   HbA1c POC (<> result, manual entry)     HbA1c, POC (prediabetic range)     HbA1c, POC (controlled diabetic range)          Beverley Adine Hummer, MD, MS

## 2023-11-09 ENCOUNTER — Telehealth: Payer: Self-pay

## 2023-11-09 NOTE — Telephone Encounter (Signed)
 4 boxes of Ozempic  arrived to the office for the pt to pick up. Placed in med fridge.

## 2023-11-10 ENCOUNTER — Other Ambulatory Visit: Payer: Self-pay

## 2023-11-10 NOTE — Progress Notes (Signed)
   11/10/2023  Patient ID: Kelli Ruiz, female   DOB: Mar 15, 1956, 68 y.o.   MRN: [id number]  Subjective/Objective Telephone visit to follow-up on management of diabetes   Diabetes Management Plan -Current medications:  Farxiga  5mg  daily, Ozempic  0.5mg  weekly -Patient unable to tolerate 10mg  of Farxiga  in the past due to excessive urination -Ozempic  recently increased to 0.5mg  weekly and pioglitazone  stopped based on current A1c and decreased risk of hypoglycemia when taking Farxiga  and Ozempic  w/o Pioglitazone  on board -Patient took first dose of Ozempic  0.5mg  on Saturday, but has not yet stopped pioglitazone  -Last A1c 6.9% on 6/19, down from 8.2% -Checking home FBG regularly and endorses values 100-108; states she has sx of hypoglycemia if BG <90 -4 boxes of Ozempic  0.25/0.5mg  arrived at PCP office yesterday from [facility] PAP   Assessment/Plan   Diabetes Management Plan -Current controlled based on A1c <7% -Continue Farxiga  5mg  daily and Ozempic  0.5mg  weekly -Patient will contact [facility] to request they no longer fill pioglitazone .  She is aware which tablets these are to remove from her current pill packs from the pharmacy. -Continue to monitor home FBG regularly  Follow-up:  7/31   Channing DELENA Mealing, PharmD, DPLA

## 2023-11-14 ENCOUNTER — Other Ambulatory Visit: Payer: Self-pay | Admitting: Family Medicine

## 2023-11-14 DIAGNOSIS — I152 Hypertension secondary to endocrine disorders: Secondary | ICD-10-CM

## 2023-11-25 ENCOUNTER — Other Ambulatory Visit: Payer: Self-pay | Admitting: Family

## 2023-11-25 DIAGNOSIS — R911 Solitary pulmonary nodule: Secondary | ICD-10-CM

## 2023-11-25 DIAGNOSIS — D696 Thrombocytopenia, unspecified: Secondary | ICD-10-CM

## 2023-11-28 ENCOUNTER — Encounter (HOSPITAL_BASED_OUTPATIENT_CLINIC_OR_DEPARTMENT_OTHER): Payer: Self-pay

## 2023-11-28 ENCOUNTER — Ambulatory Visit (HOSPITAL_BASED_OUTPATIENT_CLINIC_OR_DEPARTMENT_OTHER)
Admission: RE | Admit: 2023-11-28 | Discharge: 2023-11-28 | Disposition: A | Discharge status: Home / Self Care | Source: Ambulatory Visit | Attending: Family | Admitting: Family

## 2023-11-28 ENCOUNTER — Telehealth: Payer: Self-pay | Admitting: Family Medicine

## 2023-11-28 ENCOUNTER — Inpatient Hospital Stay: Admitting: Family

## 2023-11-28 ENCOUNTER — Inpatient Hospital Stay: Attending: Hematology & Oncology

## 2023-11-28 VITALS — BP 119/63 | HR 77 | Temp 97.6°F | Resp 18 | Ht 64.0 in | Wt 143.1 lb

## 2023-11-28 DIAGNOSIS — D696 Thrombocytopenia, unspecified: Secondary | ICD-10-CM | POA: Diagnosis not present

## 2023-11-28 DIAGNOSIS — E119 Type 2 diabetes mellitus without complications: Secondary | ICD-10-CM | POA: Diagnosis not present

## 2023-11-28 DIAGNOSIS — R911 Solitary pulmonary nodule: Secondary | ICD-10-CM

## 2023-11-28 DIAGNOSIS — Z87891 Personal history of nicotine dependence: Secondary | ICD-10-CM | POA: Insufficient documentation

## 2023-11-28 DIAGNOSIS — R110 Nausea: Secondary | ICD-10-CM | POA: Insufficient documentation

## 2023-11-28 DIAGNOSIS — K760 Fatty (change of) liver, not elsewhere classified: Secondary | ICD-10-CM | POA: Diagnosis not present

## 2023-11-28 DIAGNOSIS — I700 Atherosclerosis of aorta: Secondary | ICD-10-CM | POA: Diagnosis not present

## 2023-11-28 DIAGNOSIS — J984 Other disorders of lung: Secondary | ICD-10-CM | POA: Diagnosis not present

## 2023-11-28 DIAGNOSIS — Z79899 Other long term (current) drug therapy: Secondary | ICD-10-CM | POA: Insufficient documentation

## 2023-11-28 DIAGNOSIS — K449 Diaphragmatic hernia without obstruction or gangrene: Secondary | ICD-10-CM | POA: Diagnosis not present

## 2023-11-28 DIAGNOSIS — R918 Other nonspecific abnormal finding of lung field: Secondary | ICD-10-CM | POA: Diagnosis not present

## 2023-11-28 DIAGNOSIS — Z7985 Long-term (current) use of injectable non-insulin antidiabetic drugs: Secondary | ICD-10-CM | POA: Insufficient documentation

## 2023-11-28 DIAGNOSIS — R197 Diarrhea, unspecified: Secondary | ICD-10-CM | POA: Diagnosis not present

## 2023-11-28 LAB — CBC WITH DIFFERENTIAL (CANCER CENTER ONLY)
Abs Immature Granulocytes: 0.01 K/uL (ref 0.00–0.07)
Basophils Absolute: 0.1 K/uL (ref 0.0–0.1)
Basophils Relative: 1 %
Eosinophils Absolute: 0.1 K/uL (ref 0.0–0.5)
Eosinophils Relative: 2 %
HCT: 46.4 % — ABNORMAL HIGH (ref 36.0–46.0)
Hemoglobin: 15.5 g/dL — ABNORMAL HIGH (ref 12.0–15.0)
Immature Granulocytes: 0 %
Lymphocytes Relative: 41 %
Lymphs Abs: 2.3 K/uL (ref 0.7–4.0)
MCH: 30 pg (ref 26.0–34.0)
MCHC: 33.4 g/dL (ref 30.0–36.0)
MCV: 89.7 fL (ref 80.0–100.0)
Monocytes Absolute: 0.6 K/uL (ref 0.1–1.0)
Monocytes Relative: 11 %
Neutro Abs: 2.5 K/uL (ref 1.7–7.7)
Neutrophils Relative %: 45 %
Platelet Count: 159 K/uL (ref 150–400)
RBC: 5.17 MIL/uL — ABNORMAL HIGH (ref 3.87–5.11)
RDW: 13.9 % (ref 11.5–15.5)
WBC Count: 5.6 K/uL (ref 4.0–10.5)
nRBC: 0 % (ref 0.0–0.2)

## 2023-11-28 LAB — SAVE SMEAR(SSMR), FOR PROVIDER SLIDE REVIEW

## 2023-11-28 LAB — CMP (CANCER CENTER ONLY)
ALT: 28 U/L (ref 0–44)
AST: 24 U/L (ref 15–41)
Albumin: 4.4 g/dL (ref 3.5–5.0)
Alkaline Phosphatase: 53 U/L (ref 38–126)
Anion gap: 9 (ref 5–15)
BUN: 12 mg/dL (ref 8–23)
CO2: 29 mmol/L (ref 22–32)
Calcium: 10.2 mg/dL (ref 8.9–10.3)
Chloride: 103 mmol/L (ref 98–111)
Creatinine: 0.96 mg/dL (ref 0.44–1.00)
GFR, Estimated: >60 mL/min (ref >60)
Glucose, Bld: 118 mg/dL — ABNORMAL HIGH (ref 70–99)
Potassium: 4.3 mmol/L (ref 3.5–5.1)
Sodium: 141 mmol/L (ref 135–145)
Total Bilirubin: 0.8 mg/dL (ref 0.0–1.2)
Total Protein: 7.3 g/dL (ref 6.5–8.1)

## 2023-11-28 LAB — LACTATE DEHYDROGENASE: LDH: 137 U/L (ref 98–192)

## 2023-11-28 MED ORDER — IOHEXOL 300 MG/ML  SOLN
100.0000 mL | Freq: Once | INTRAMUSCULAR | Status: AC | PRN
  Administered 2023-11-28: 80 mL via INTRAVENOUS

## 2023-11-28 NOTE — Telephone Encounter (Signed)
 Pt picked up her Ozempic  today 11/28/23 at 12:00

## 2023-11-28 NOTE — Progress Notes (Unsigned)
 Hematology and Oncology Follow Up Visit  Kelli Ruiz [id number] Nov 04, 1955 68 y.o. 11/28/2023   Principle Diagnosis:  Thrombocytopenia  Fatty liver RUL Lung nodule  Current Therapy:   Observation    Interim History:  Kelli Ruiz is here today for follow-up. She is doing well. She notes occasional tenderness in the left upper abdominal quadrant. This comes and goes.  She has started treatment with Ozempic  and has had some occasional nausea and diarrhea.  Her weight is down 8 lbs since we last saw her. She notes that her appetite is decreased especially her craving for sweets.  She is staying well hydrated.  CT in March showed fatty liver as well as right upper lung lobe 1.5 x 0.7 cm nodule. PET recommended and  showed a solid nodule without any abnormal uptake. CT repeated today and results are pending.  No fever, chills, vomiting, cough, rash, dizziness, SOB, chest pain, palpitations, abdominal pain or changes in bowel or bladder habits.  No swelling, tenderness, numbness or tingling in her extremities.  No falls or syncope.  No bleeding, bruising or petechiae.  Platelets are back within normal limits at 159.  ECOG Performance Status: 0 - Asymptomatic  Medications:  Allergies as of 11/28/2023   No Active Allergies      Medication List        Accurate as of November 28, 2023 11:54 AM. If you have any questions, ask your nurse or doctor.          Accu-Chek Guide test strip Generic drug: glucose blood USE TO TEST BLOOD SUGAR ONCE DAILY AS DIRECTED   Accu-Chek Softclix Lancets lancets USE TO TEST BLOOD SUGAR DAILY AS DIRECTED   albuterol  108 (90 Base) MCG/ACT inhaler Commonly known as: VENTOLIN  HFA Inhale 2 puffs into the lungs every 6 (six) hours as needed for wheezing or shortness of breath.   amLODipine  5 MG tablet Commonly known as: NORVASC  TAKE 1 TABLET BY MOUTH ONCE DAILY   blood glucose meter kit and supplies Dispense based on patient and insurance  preference. Use up to four times daily as directed. (FOR ICD-10 E10.9, E11.9).   cetirizine  10 MG tablet Commonly known as: Allergy (Cetirizine ) Take 1 tablet (10 mg total) by mouth daily.   citalopram  40 MG tablet Commonly known as: CELEXA  TAKE 1 TABLET BY MOUTH ONCE DAILY   diclofenac  Sodium 1 % Gel Commonly known as: Voltaren  Apply 4 g topically 4 (four) times daily as needed.   Farxiga  5 MG Tabs tablet Generic drug: dapagliflozin  propanediol Take 1 tablet (5 mg total) by mouth daily.   fluticasone  50 MCG/ACT nasal spray Commonly known as: FLONASE  Place 2 sprays into both nostrils daily as needed for allergies or rhinitis.   gabapentin  600 MG tablet Commonly known as: NEURONTIN  TAKE 1 TABLET BY MOUTH AT BREAKFAST AND AT BEDTIME   hydroxychloroquine  200 MG tablet Commonly known as: PLAQUENIL  TAKE 1 TABLET BY MOUTH TWICE DAILY, MONDAY THROUGH FRIDAY. NONE ON SATURDAY OR SUNDAY.   Ozempic  (0.25 or 0.5 MG/DOSE) 2 MG/3ML Sopn Generic drug: Semaglutide (0.25 or 0.5MG /DOS) Inject 0.5 mg into the skin once a week. Inject 0.25mg  weekly x4 weeks, then increase to 0.5mg  weekly   polyethylene glycol powder 17 GM/SCOOP powder Commonly known as: GLYCOLAX /MIRALAX  Take 17 g by mouth daily.   rosuvastatin  10 MG tablet Commonly known as: CRESTOR  TAKE 1 TABLET BY MOUTH EVERY DAY AT BEDTIME        Allergies: No Active Allergies  Past Medical History, Surgical history,  Social history, and Family History were reviewed and updated.  Review of Systems: All other 10 point review of systems is negative.   Physical Exam:  height is 5' 4 (1.626 m) and weight is 143 lb 1.9 oz (64.9 kg). Her oral temperature is 97.6 F (36.4 C). Her blood pressure is 119/63 and her pulse is 77. Her respiration is 18 and oxygen saturation is 99%.   Wt Readings from Last 3 Encounters:  11/28/23 143 lb 1.9 oz (64.9 kg)  11/03/23 151 lb 6.4 oz (68.7 kg)  08/01/23 154 lb 1.9 oz (69.9 kg)    Ocular:  Sclerae unicteric, pupils equal, round and reactive to light Ear-nose-throat: Oropharynx clear, dentition fair Lymphatic: No cervical or supraclavicular adenopathy Lungs no rales or rhonchi, good excursion bilaterally Heart regular rate and rhythm, no murmur appreciated Abd soft, nontender, positive bowel sounds MSK no focal spinal tenderness, no joint edema Neuro: non-focal, well-oriented, appropriate affect Breasts: Deferred   Lab Results  Component Value Date   WBC 5.6 11/28/2023   HGB 15.5 (H) 11/28/2023   HCT 46.4 (H) 11/28/2023   MCV 89.7 11/28/2023   PLT 159 11/28/2023   No results found for: FERRITIN, IRON, TIBC, UIBC, IRONPCTSAT Lab Results  Component Value Date   RBC 5.17 (H) 11/28/2023   No results found for: KPAFRELGTCHN, LAMBDASER, KAPLAMBRATIO No results found for: IGGSERUM, IGA, IGMSERUM No results found for: STEPHANY CARLOTA BENSON MARKEL EARLA JOANNIE DOC VICK, SPEI   Chemistry      Component Value Date/Time   NA 141 11/28/2023 0918   K 4.3 11/28/2023 0918   CL 103 11/28/2023 0918   CO2 29 11/28/2023 0918   BUN 12 11/28/2023 0918   CREATININE 0.96 11/28/2023 0918   CREATININE 1.02 06/22/2022 1122      Component Value Date/Time   CALCIUM  10.2 11/28/2023 0918   ALKPHOS 53 11/28/2023 0918   AST 24 11/28/2023 0918   ALT 28 11/28/2023 0918   BILITOT 0.8 11/28/2023 0918       Impression and Plan: Kelli Ruiz is a pleasant 68 yo caucasian female with history of mild thrombocytopenia. Her counts today look great.  Platelets are 159.  CT of the chest to follow-up for lung nodule is pending.  Follow-up in another 3 months. We can adjust this if needed based on CT.   Lauraine Pepper, NP 7/14/202511:54 AM

## 2023-12-05 ENCOUNTER — Ambulatory Visit
Admission: RE | Admit: 2023-12-05 | Discharge: 2023-12-05 | Disposition: A | Discharge status: Home / Self Care | Source: Ambulatory Visit | Attending: Family Medicine

## 2023-12-05 DIAGNOSIS — Z1231 Encounter for screening mammogram for malignant neoplasm of breast: Secondary | ICD-10-CM | POA: Diagnosis not present

## 2023-12-07 ENCOUNTER — Ambulatory Visit: Payer: Self-pay | Admitting: *Deleted

## 2023-12-14 ENCOUNTER — Other Ambulatory Visit: Payer: Self-pay | Admitting: Family Medicine

## 2023-12-14 DIAGNOSIS — E1165 Type 2 diabetes mellitus with hyperglycemia: Secondary | ICD-10-CM

## 2023-12-15 ENCOUNTER — Other Ambulatory Visit: Payer: Self-pay

## 2023-12-15 NOTE — Progress Notes (Signed)
   12/15/2023  Patient ID: Kelli Ruiz, female   DOB: [birth year], 68 y.o.   MRN: [id number]  Subjective/Objective Telephone visit to follow-up on management of diabetes   Diabetes Management Plan -Current medications:  Farxiga  5mg  daily, Ozempic  0.5mg  weekly -Patient unable to tolerate 10mg  of Farxiga  in the past due to excessive urination -Ozempic  dose was increased to 0.5mg  weekly approximately 1 month ago and pioglitazone  was stopped to prevent hypoglycemia -Last A1c 6.9% on 6/19, down from 8.2% -Checking home FBG regularly and endorses values averaging 105-115 -Patient has been experiencing abdominal pain, as well as some constipation and also diarrhea since increasing Ozempic  dose.  She also endorses a significant decrease in appetite, stating smell of food can  make her nauseas; and she is down to approximately 140lbs. -States she is drinking plenty of fluids but is not eating much at all -Patient tolerated 0.25mg  dose of Ozempic  well -Current has 1 box of 0.25/0.5mg  and 8 boxes of 1mg  Ozempic  received from [facility].  Assessment/Plan   Diabetes Management Plan -Current controlled based on A1c <7% -Continue Farxiga  5mg  daily and decrease Ozempic  to 0.25mg  weekly for at least another 4 weeks.  If tolerated well, we can adjust dose to 0.375mg  weekly before going back to 0.5mg  weekly. -Continue to monitor home FBG regularly -Sees Dr. Sebastian again 9/18 and will be due for A1c   Follow-up:  3 weeks   Channing DELENA Mealing, PharmD, DPLA

## 2023-12-20 ENCOUNTER — Telehealth: Payer: Self-pay

## 2023-12-20 NOTE — Telephone Encounter (Signed)
 completed

## 2023-12-20 NOTE — Telephone Encounter (Deleted)
 Ozempic  came on 12/20/2023 4 boxes 2mg  dosage.

## 2023-12-20 NOTE — Telephone Encounter (Signed)
 complete

## 2024-01-04 NOTE — Progress Notes (Unsigned)
   01/05/2024  Patient ID: Kelli Ruiz, female   DOB: 1956/01/02, 68 y.o.   MRN: [id number]  Subjective/Objective Telephone visit to follow-up on management of diabetes   Diabetes Management Plan -Current medications:  Farxiga  5mg  daily, Ozempic  0.52mg  weekly -Patient unable to tolerate 10mg  of Farxiga  in the past due to excessive urination -Ozempic  dose was increased to 0.5mg  weekly approximately 2 months ago and pioglitazone  was stopped to prevent hypoglycemia; but patient was not able to tolerate 0.5mg  Ozempic  does- decreased Ozempic  to 0.25mg  3 weeks ago.  GI symptoms have resolved except for lack of appetite.  Patient is concerned with amount of weight loss and potential decrease in muscle mass and effect on bone health.  Has not weighed recently, but was down to 140lbs when we spoke 3 weeks ago. -Last A1c 6.9% on 6/19, down from 8.2% -Checking home FBG regularly and endorses values averaging 105-115 even with decreased Ozempic  dose -[facility] has 1 box of 0.25/0.5mg  and 8 boxes of 1mg  Ozempic  received from [facility].  Assessment/Plan   Diabetes Management Plan -Current controlled based on A1c <7% -Continue Farxiga  5mg  daily, stop Ozempic , initiate Rybelsus  3mg  daily  -Will contact PCP office to see if they have any samples of Rybelsus  on hand -Working on order change form for [facility] to switch patient from Ozempic  to Rybleus -Patient will bring Ozempic  to office at next PCP visit -Continue to monitor home FBG regularly -Sees Dr. Sebastian again 9/18 and will be due for A1c   Follow-up:  Will notify patient regarding availability of Rybelsus  samples   Kelli Ruiz, PharmD, DPLA

## 2024-01-05 ENCOUNTER — Other Ambulatory Visit: Payer: Self-pay

## 2024-01-05 ENCOUNTER — Other Ambulatory Visit

## 2024-01-05 DIAGNOSIS — E119 Type 2 diabetes mellitus without complications: Secondary | ICD-10-CM

## 2024-01-06 ENCOUNTER — Telehealth: Payer: Self-pay

## 2024-01-06 DIAGNOSIS — E119 Type 2 diabetes mellitus without complications: Secondary | ICD-10-CM

## 2024-01-06 MED ORDER — RYBELSUS 3 MG PO TABS: 3.0000 mg | ORAL_TABLET | Freq: Every day | ORAL | 3 refills | Status: DC

## 2024-01-06 NOTE — Addendum Note (Signed)
 Addended by: SEBASTIAN RIGHTER B on: 01/06/2024 11:45 AM   Modules accepted: Orders

## 2024-01-06 NOTE — Progress Notes (Signed)
   01/06/2024  Patient ID: Kelli Ruiz, female   DOB: 10/07/1955, 68 y.o.   MRN: [id number]  PCP in agreement with changing patient from Ozempic  0.25mg  weekly to Rybelsus  3mg  daily.  Coordinating with medication assistance team to initiate order change form.  I am also seeing if Dr. Oswald office happens to have any samples of Rybelsus  (usually do not keep samples) and verify patient can bring in unopened Ozempic  pens to next PCP visit.  Channing DELENA Mealing, PharmD, DPLA

## 2024-01-09 ENCOUNTER — Telehealth: Payer: Self-pay

## 2024-01-09 NOTE — Telephone Encounter (Signed)
 Faxed provider portion of dose change order form for Rybelsus  (Novo Nordisk) to office for review and signature.

## 2024-01-13 ENCOUNTER — Telehealth: Payer: Self-pay

## 2024-01-13 NOTE — Progress Notes (Signed)
   01/13/2024  Patient ID: Kelli Ruiz, female   DOB: 1955-11-08, 68 y.o.   MRN: [id number]  Order change form for Rybelsus  3/7mg  through [facility] went into processing today, and the medication should arrive at Dr. Oswald office in 10-14 business days.  Attempted to contact patient to make her aware of this and that the office does not have any samples available in the meantime.  I was not able to reach her, but I did leave a HIPAA compliant voicemail and am sending a MyChart message.  Kelli Ruiz, PharmD, DPLA

## 2024-02-02 ENCOUNTER — Ambulatory Visit: Admitting: Family Medicine

## 2024-02-09 ENCOUNTER — Ambulatory Visit: Admitting: Family Medicine

## 2024-02-23 ENCOUNTER — Telehealth: Payer: Self-pay

## 2024-02-23 NOTE — Telephone Encounter (Signed)
 PAP: Patient assistance application for Farxiga  through [employer] ([name]) has been mailed to pt's home address on file. Provider portion of application will be faxed to provider's office.For renewal 2026.

## 2024-02-28 ENCOUNTER — Encounter: Payer: Self-pay | Admitting: Family

## 2024-02-28 ENCOUNTER — Inpatient Hospital Stay: Attending: Hematology & Oncology

## 2024-02-28 ENCOUNTER — Inpatient Hospital Stay: Admitting: Family

## 2024-02-28 VITALS — BP 105/45 | HR 73 | Temp 98.0°F | Resp 18 | Ht 64.0 in | Wt 141.1 lb

## 2024-02-28 DIAGNOSIS — D696 Thrombocytopenia, unspecified: Secondary | ICD-10-CM

## 2024-02-28 DIAGNOSIS — Z79899 Other long term (current) drug therapy: Secondary | ICD-10-CM | POA: Insufficient documentation

## 2024-02-28 DIAGNOSIS — R5383 Other fatigue: Secondary | ICD-10-CM | POA: Insufficient documentation

## 2024-02-28 DIAGNOSIS — R911 Solitary pulmonary nodule: Secondary | ICD-10-CM | POA: Insufficient documentation

## 2024-02-28 DIAGNOSIS — R059 Cough, unspecified: Secondary | ICD-10-CM | POA: Insufficient documentation

## 2024-02-28 DIAGNOSIS — K760 Fatty (change of) liver, not elsewhere classified: Secondary | ICD-10-CM | POA: Diagnosis not present

## 2024-02-28 LAB — CBC WITH DIFFERENTIAL (CANCER CENTER ONLY)
Abs Immature Granulocytes: 0.02 K/uL (ref 0.00–0.07)
Basophils Absolute: 0.1 K/uL (ref 0.0–0.1)
Basophils Relative: 1 %
Eosinophils Absolute: 0.2 K/uL (ref 0.0–0.5)
Eosinophils Relative: 3 %
HCT: 47.7 % — ABNORMAL HIGH (ref 36.0–46.0)
Hemoglobin: 15.7 g/dL — ABNORMAL HIGH (ref 12.0–15.0)
Immature Granulocytes: 0 %
Lymphocytes Relative: 38 %
Lymphs Abs: 2.8 K/uL (ref 0.7–4.0)
MCH: 30.3 pg (ref 26.0–34.0)
MCHC: 32.9 g/dL (ref 30.0–36.0)
MCV: 92.1 fL (ref 80.0–100.0)
Monocytes Absolute: 0.6 K/uL (ref 0.1–1.0)
Monocytes Relative: 9 %
Neutro Abs: 3.6 K/uL (ref 1.7–7.7)
Neutrophils Relative %: 49 %
Platelet Count: 189 K/uL (ref 150–400)
RBC: 5.18 MIL/uL — ABNORMAL HIGH (ref 3.87–5.11)
RDW: 13.6 % (ref 11.5–15.5)
WBC Count: 7.3 K/uL (ref 4.0–10.5)
nRBC: 0 % (ref 0.0–0.2)

## 2024-02-28 LAB — CMP (CANCER CENTER ONLY)
ALT: 36 U/L (ref 0–44)
AST: 31 U/L (ref 15–41)
Albumin: 4.4 g/dL (ref 3.5–5.0)
Alkaline Phosphatase: 73 U/L (ref 38–126)
Anion gap: 9 (ref 5–15)
BUN: 15 mg/dL (ref 8–23)
CO2: 30 mmol/L (ref 22–32)
Calcium: 10 mg/dL (ref 8.9–10.3)
Chloride: 104 mmol/L (ref 98–111)
Creatinine: 0.84 mg/dL (ref 0.44–1.00)
GFR, Estimated: >60 mL/min (ref >60)
Glucose, Bld: 145 mg/dL — ABNORMAL HIGH (ref 70–99)
Potassium: 5.1 mmol/L (ref 3.5–5.1)
Sodium: 143 mmol/L (ref 135–145)
Total Bilirubin: 0.7 mg/dL (ref 0.0–1.2)
Total Protein: 7.3 g/dL (ref 6.5–8.1)

## 2024-02-28 LAB — SAVE SMEAR(SSMR), FOR PROVIDER SLIDE REVIEW

## 2024-02-28 LAB — LACTATE DEHYDROGENASE: LDH: 145 U/L (ref 98–192)

## 2024-02-28 NOTE — Progress Notes (Signed)
 Hematology and Oncology Follow Up Visit  Kelli Ruiz [id number] 1955/09/07 68 y.o. 02/28/2024   Principle Diagnosis:  Thrombocytopenia  Fatty liver RUL Lung nodule   Current Therapy:        Observation    Interim History:  Kelli Ruiz is here today for follow-up. She is doing well but does note fatigue.  She has not been sleeping well because her sweet 35 yo dog is not doing well. She checks on them frequently through the night.  No issue with fever, chills, n/v, cough, rash, dizziness, SOB, chest pain, palpitations abdominal pain or changes in bowel or bladder habits.  She states that she occasional has a dry cough with allergies.  No swelling, tenderness numbness or tingling in her extremities.  No falls or syncope.  Appetite and hydration are good. Weight is stable at 141 lbs.   ECOG Performance Status: 0 - Asymptomatic  Medications:  Allergies as of 02/28/2024   No Active Allergies      Medication List        Accurate as of February 28, 2024 10:32 AM. If you have any questions, ask your nurse or doctor.          Accu-Chek Guide test strip Generic drug: glucose blood USE TO TEST BLOOD SUGAR ONCE DAILY AS DIRECTED   Accu-Chek Softclix Lancets lancets USE TO TEST BLOOD SUGAR DAILY AS DIRECTED   albuterol  108 (90 Base) MCG/ACT inhaler Commonly known as: VENTOLIN  HFA Inhale 2 puffs into the lungs every 6 (six) hours as needed for wheezing or shortness of breath.   amLODipine  5 MG tablet Commonly known as: NORVASC  TAKE 1 TABLET BY MOUTH ONCE DAILY   blood glucose meter kit and supplies Dispense based on patient and insurance preference. Use up to four times daily as directed. (FOR ICD-10 E10.9, E11.9).   cetirizine  10 MG tablet Commonly known as: Allergy (Cetirizine ) Take 1 tablet (10 mg total) by mouth daily.   citalopram  40 MG tablet Commonly known as: CELEXA  TAKE 1 TABLET BY MOUTH ONCE DAILY   diclofenac  Sodium 1 % Gel Commonly known as:  Voltaren  Apply 4 g topically 4 (four) times daily as needed.   Farxiga  5 MG Tabs tablet Generic drug: dapagliflozin  propanediol Take 1 tablet (5 mg total) by mouth daily.   fluticasone  50 MCG/ACT nasal spray Commonly known as: FLONASE  Place 2 sprays into both nostrils daily as needed for allergies or rhinitis.   gabapentin  600 MG tablet Commonly known as: NEURONTIN  TAKE 1 TABLET BY MOUTH AT BREAKFAST AND AT BEDTIME   hydroxychloroquine  200 MG tablet Commonly known as: PLAQUENIL  TAKE 1 TABLET BY MOUTH TWICE DAILY, MONDAY THROUGH FRIDAY. NONE ON SATURDAY OR SUNDAY.   polyethylene glycol powder 17 GM/SCOOP powder Commonly known as: GLYCOLAX /MIRALAX  Take 17 g by mouth daily.   rosuvastatin  10 MG tablet Commonly known as: CRESTOR  TAKE 1 TABLET BY MOUTH EVERY DAY AT BEDTIME   Rybelsus  3 MG Tabs Generic drug: Semaglutide  Take 1 tablet (3 mg total) by mouth daily.        Allergies: No Active Allergies  Past Medical History, Surgical history, Social history, and Family History were reviewed and updated.  Review of Systems: All other 10 point review of systems is negative.   Physical Exam:  vitals were not taken for this visit.   Wt Readings from Last 3 Encounters:  11/28/23 143 lb 1.9 oz (64.9 kg)  11/03/23 151 lb 6.4 oz (68.7 kg)  08/01/23 154 lb 1.9 oz (69.9 kg)  Ocular: Sclerae unicteric, pupils equal, round and reactive to light Ear-nose-throat: Oropharynx clear, dentition fair Lymphatic: No cervical or supraclavicular adenopathy Lungs no rales or rhonchi, good excursion bilaterally Heart regular rate and rhythm, no murmur appreciated Abd soft, nontender, positive bowel sounds MSK no focal spinal tenderness, no joint edema Neuro: non-focal, well-oriented, appropriate affect Breasts: Deferred   Lab Results  Component Value Date   WBC 5.6 11/28/2023   HGB 15.5 (H) 11/28/2023   HCT 46.4 (H) 11/28/2023   MCV 89.7 11/28/2023   PLT 159 11/28/2023   No  results found for: FERRITIN, IRON, TIBC, UIBC, IRONPCTSAT Lab Results  Component Value Date   RBC 5.17 (H) 11/28/2023   No results found for: KPAFRELGTCHN, LAMBDASER, KAPLAMBRATIO No results found for: IGGSERUM, IGA, IGMSERUM No results found for: STEPHANY CARLOTA BENSON MARKEL EARLA JOANNIE DOC VICK, SPEI   Chemistry      Component Value Date/Time   NA 141 11/28/2023 0918   K 4.3 11/28/2023 0918   CL 103 11/28/2023 0918   CO2 29 11/28/2023 0918   BUN 12 11/28/2023 0918   CREATININE 0.96 11/28/2023 0918   CREATININE 1.02 06/22/2022 1122      Component Value Date/Time   CALCIUM  10.2 11/28/2023 0918   ALKPHOS 53 11/28/2023 0918   AST 24 11/28/2023 0918   ALT 28 11/28/2023 0918   BILITOT 0.8 11/28/2023 0918       Impression and Plan: Kelli Ruiz is a pleasant 68 yo caucasian female with history of mild thrombocytopenia. Her counts today look great.  Platelets are 189!  Follow-up in another 4 months.   Lauraine Pepper, NP 10/14/202510:32 AM

## 2024-02-28 NOTE — Progress Notes (Unsigned)
   01/05/2024  Patient ID: Magdalena DELENA Salt, female   DOB: November 27, 1955, 68 y.o.   MRN: [id number]  Subjective/Objective Telephone visit to follow-up on management of diabetes   Diabetes Management Plan -Current medications:  Farxiga  5mg  daily, Ozempic  0.52mg  weekly -Patient unable to tolerate 10mg  of Farxiga  in the past due to excessive urination -Ozempic  dose was increased to 0.5mg  weekly approximately 2 months ago and pioglitazone  was stopped to prevent hypoglycemia; but patient was not able to tolerate 0.5mg  Ozempic  does- decreased Ozempic  to 0.25mg  3 weeks ago.  GI symptoms have resolved except for lack of appetite.  Patient is concerned with amount of weight loss and potential decrease in muscle mass and effect on bone health.  Has not weighed recently, but was down to 140lbs when we spoke 3 weeks ago. -Last A1c 6.9% on 6/19, down from 8.2% -Checking home FBG regularly and endorses values averaging 105-115 even with decreased Ozempic  dose -[facility] has 1 box of 0.25/0.5mg  and 8 boxes of 1mg  Ozempic  received from [facility].  Assessment/Plan   Diabetes Management Plan -Current controlled based on A1c <7% -Continue Farxiga  5mg  daily, stop Ozempic , initiate Rybelsus  3mg  daily  -Will contact PCP office to see if they have any samples of Rybelsus  on hand -Working on order change form for [facility] to switch patient from Ozempic  to Rybleus -Patient will bring Ozempic  to office at next PCP visit -Continue to monitor home FBG regularly -Sees Dr. Sebastian again 9/18 and will be due for A1c   Follow-up:  Will notify patient regarding availability of Rybelsus  samples   Channing DELENA Mealing, PharmD, DPLA

## 2024-02-29 ENCOUNTER — Other Ambulatory Visit: Payer: Self-pay

## 2024-02-29 DIAGNOSIS — I152 Hypertension secondary to endocrine disorders: Secondary | ICD-10-CM

## 2024-02-29 DIAGNOSIS — E1169 Type 2 diabetes mellitus with other specified complication: Secondary | ICD-10-CM

## 2024-02-29 DIAGNOSIS — E119 Type 2 diabetes mellitus without complications: Secondary | ICD-10-CM

## 2024-02-29 NOTE — Telephone Encounter (Signed)
 PAP: Patient assistance application for Farxiga  has been approved by PAP Companies: [name] from 05/17/2024 to 05/16/2025. Medication should be delivered to PAP Delivery: Home. For further shipping updates, please contact [employer] ([name]) at [phone]. Patient ID is: [id number]

## 2024-02-29 NOTE — Progress Notes (Signed)
 Pharmacy Medication Assistance Program Note    02/29/2024  Patient ID: DESAREA OHAGAN, female  DOB: April 11, 1956, 68 y.o.  MRN:  [id number]     02/29/2024  Outreach Medication Two  Initial Outreach Date (Medication Two) 02/23/2024  Manufacturer Medication Two [employer]  [employer] [facility] Farxiga   Type of Audiological scientist Items Requested Application  Date Application Sent to Prescriber 02/23/2024  Name of Prescriber Beverley Hummer  Date Application Received From Patient 02/23/2024  Application Items Received From Patient Application  Date Application Received From Provider 02/24/2024  Method Application Sent to Manufacturer Fax  Date Application Submitted to Manufacturer 02/24/2024  Patient Assistance Determination Approved  Approval Start Date 05/17/2024  Patient Notification Method MyChart

## 2024-03-05 NOTE — Progress Notes (Signed)
 This encounter was created in error - please disregard.

## 2024-03-06 NOTE — Telephone Encounter (Signed)
 Kelli Ruiz. Is patient currently taking 0.25/5MG  OZEMPIC  pens? We currently have 4 boxes in clinic. I did review message below and wanted to confirm. I also received a [facility] patient program application today. Please let me know .

## 2024-03-07 ENCOUNTER — Telehealth: Payer: Self-pay

## 2024-03-07 ENCOUNTER — Other Ambulatory Visit (HOSPITAL_COMMUNITY): Payer: Self-pay

## 2024-03-07 NOTE — Telephone Encounter (Signed)
 Paperwork was sent and faxed on 03/07/2024 with confirmation

## 2024-03-07 NOTE — Telephone Encounter (Signed)
 Refill form completed for Rybelsus  and faxed to provider office for review and signature.

## 2024-03-09 ENCOUNTER — Telehealth: Payer: Self-pay | Admitting: Family Medicine

## 2024-03-09 NOTE — Telephone Encounter (Addendum)
 Pt picked up rybelsus  3MG  and 7MG  today; 1 box each

## 2024-03-09 NOTE — Telephone Encounter (Signed)
 Error

## 2024-03-13 ENCOUNTER — Other Ambulatory Visit: Payer: Self-pay | Admitting: Family Medicine

## 2024-03-14 NOTE — Telephone Encounter (Signed)
 Called pt to inform that RX for Rybelsus  7MG  (3 boxes) is ready for pick up; left VM and sent MyChart message .

## 2024-03-15 NOTE — Telephone Encounter (Signed)
 Rx is stored at my desk in top right upper cabinet lower shelf.

## 2024-03-17 NOTE — Progress Notes (Unsigned)
   01/05/2024  Patient ID: Kelli Ruiz, female   DOB: November 27, 1955, 68 y.o.   MRN: [id number]  Subjective/Objective Telephone visit to follow-up on management of diabetes   Diabetes Management Plan -Current medications:  Farxiga  5mg  daily, Ozempic  0.52mg  weekly -Patient unable to tolerate 10mg  of Farxiga  in the past due to excessive urination -Ozempic  dose was increased to 0.5mg  weekly approximately 2 months ago and pioglitazone  was stopped to prevent hypoglycemia; but patient was not able to tolerate 0.5mg  Ozempic  does- decreased Ozempic  to 0.25mg  3 weeks ago.  GI symptoms have resolved except for lack of appetite.  Patient is concerned with amount of weight loss and potential decrease in muscle mass and effect on bone health.  Has not weighed recently, but was down to 140lbs when we spoke 3 weeks ago. -Last A1c 6.9% on 6/19, down from 8.2% -Checking home FBG regularly and endorses values averaging 105-115 even with decreased Ozempic  dose -[facility] has 1 box of 0.25/0.5mg  and 8 boxes of 1mg  Ozempic  received from [facility].  Assessment/Plan   Diabetes Management Plan -Current controlled based on A1c <7% -Continue Farxiga  5mg  daily, stop Ozempic , initiate Rybelsus  3mg  daily  -Will contact PCP office to see if they have any samples of Rybelsus  on hand -Working on order change form for [facility] to switch patient from Ozempic  to Rybleus -Patient will bring Ozempic  to office at next PCP visit -Continue to monitor home FBG regularly -Sees Dr. Sebastian again 9/18 and will be due for A1c   Follow-up:  Will notify patient regarding availability of Rybelsus  samples   Kelli Ruiz, PharmD, DPLA

## 2024-03-19 ENCOUNTER — Other Ambulatory Visit: Payer: Self-pay

## 2024-03-19 DIAGNOSIS — E1159 Type 2 diabetes mellitus with other circulatory complications: Secondary | ICD-10-CM

## 2024-03-19 DIAGNOSIS — E119 Type 2 diabetes mellitus without complications: Secondary | ICD-10-CM

## 2024-03-19 DIAGNOSIS — E1169 Type 2 diabetes mellitus with other specified complication: Secondary | ICD-10-CM

## 2024-04-05 ENCOUNTER — Other Ambulatory Visit: Payer: Self-pay

## 2024-04-05 DIAGNOSIS — E119 Type 2 diabetes mellitus without complications: Secondary | ICD-10-CM

## 2024-04-05 DIAGNOSIS — E1159 Type 2 diabetes mellitus with other circulatory complications: Secondary | ICD-10-CM

## 2024-04-05 NOTE — Progress Notes (Signed)
   04/05/24  Patient ID: Kelli Ruiz, female   DOB: 12-30-1955, 67 y.o.   MRN: [id number]  Subjective/Objective Telephone visit to follow-up on management of chronic conditions   Diabetes Management Plan -Current medications:  Farxiga  5mg  daily, Rybelsus  3mg  daily -Patient unable to tolerate 10mg  of Farxiga  in the past due to excessive urination -Patient was also not able to tolerate Ozempic  due to GI upset, so this was recently changed to Rybelsus .  Patient has been taking Rybelsus  3mg  daily approximately 3 weeks now.  Tolerating well so far, but did notice some constipation and nausea at first but states this is improving.  Using Miralax  daily as needed and has increased fluid intake (with water  and non-sugar beverages) -She received 1 bottle of 3mg  and 1 of 7mg  Rybelsus  from [facility]- 3 more boxes of 7mg  have arrived at [facility] and are ready for pick up -Patient receives Farxiga  5mg  through [name] [facility] and recently completed her authorization for 2026 re-enrollment -Last A1c 6.9% on 6/19, down from 8.2% -Patient has not been checking home BG regularly  -ACEi/ARB for cardiorenal protection: No -Statin for ASCVD risk reduction:  Yes; rosuvastatin  10mg  daily- last LDL was 64 on 2/27 -UACR of 11.7 on 2/27  Blood Pressure Control -Current medications:  amlodipine  5mg  daily -Patient does not monitor home BP, but last OV reading was somewhat low at 105/45 on 10/14 -Patient does not endorse any s/sx of hypotension  Assessment/Plan   Diabetes Management Plan -Current controlled based on A1c <7% -Continue Farxiga  5mg  daily -Continue Rybelsus  3mg  daily for a total of 1 month, then increase to 7mg  daily.  Patient will pick up the 3 boxes of 7mg  as soon as she is able. -Continue Miralax  daily as needed as well as adequate hydration -Resume home monitoring of FBG -Patient will not be able to receive Rybelsus  through [facility] after 1/1, but we completed [employer] Medicare Application online today.   If approved, copay for Rybelsus  would be around $12.  She should get a letter in the mail with determination in the next 4-6 weeks. -Patient was scheduled to see Dr. Sebastian in September, but appointment was reschedule and then canceled.  She is due for a follow-up and A1c, CMP, UACR, and lipid panel.  Does not currently have insurance, but this should be reinstated after the 1st of the year, and she will schedule a follow-up with Dr. Sebastian at that time.  Blood Pressure Control -Continue current regimen at this time -Patient plans to purchase automated, upper arm BP monitor to begin checking home BP regualrly -If BP continues to run low, or if patient begins to experience any s/sx of hypotension, I would recommend either decreasing amlodipine  to 2.5mg  daily or discontinuing    Follow-up:  12/30   Kelli Ruiz, PharmD, DPLA

## 2024-04-11 NOTE — Progress Notes (Signed)
 Kelli Ruiz                                          MRN: [id number]   04/11/2024   The [facility] Quality Team Specialist reviewed this patient medical record for the purposes of chart review for care gap closure. The following were reviewed: abstraction for care gap closure-glycemic status assessment. KED closed in [facility] portal.    [facility]BCI Quality Team

## 2024-05-14 ENCOUNTER — Other Ambulatory Visit: Payer: Self-pay | Admitting: Family Medicine

## 2024-05-14 DIAGNOSIS — E1169 Type 2 diabetes mellitus with other specified complication: Secondary | ICD-10-CM

## 2024-05-14 NOTE — Progress Notes (Signed)
 "  05/15/24  Patient ID: Kelli Ruiz, female   DOB: 1955/06/08, 68 y.o.   MRN: [id number]  Subjective/Objective Telephone visit to follow-up on management of chronic conditions   Diabetes Management Plan -Current medications:  Farxiga  5mg  daily, Rybelsus  3mg  daily -Patient unable to tolerate 10mg  of Farxiga  in the past due to excessive urination -Patient was also not able to tolerate Ozempic  due to GI upset, so this was recently changed to Rybelsus .  Patient states she just started taking Rybelsus  3mg  daily yesterday. -She received 1 bottle of 3mg  and 1 of 7mg  Rybelsus  from [facility]- 3 more boxes of 7mg  have arrived at [facility] and are ready for pick up -Patient receives Farxiga  5mg  through [name] and recently completed her re-enrollment for 2026  -Patient has not been checking home BG regularly and is needing a refill on test strips -ACEi/ARB for cardiorenal protection: No -Statin for ASCVD risk reduction:  Yes; rosuvastatin  10mg  daily -UACR of 11.7 on 2/27  Blood Pressure Control -Current medications:  amlodipine  5mg  daily -Patient does not monitor home BP, but last OV reading was somewhat low at 105/45 on 10/14 -Patient does not endorse any s/sx of hypotension  Lab Results  Component Value Date   HGBA1C 6.9 (A) 11/03/2023   HGBA1C 8.2 (H) 07/19/2023   HGBA1C 8.1 (A) 07/14/2023       Component Value Date/Time   NA 143 02/28/2024 1017   K 5.1 02/28/2024 1017   CL 104 02/28/2024 1017   CO2 30 02/28/2024 1017   GLUCOSE 145 (H) 02/28/2024 1017   BUN 15 02/28/2024 1017   CREATININE 0.84 02/28/2024 1017   CREATININE 1.02 06/22/2022 1122   CALCIUM  10.0 02/28/2024 1017   PROT 7.3 02/28/2024 1017   ALBUMIN 4.4 02/28/2024 1017   AST 31 02/28/2024 1017   ALT 36 02/28/2024 1017   ALKPHOS 73 02/28/2024 1017   BILITOT 0.7 02/28/2024 1017   GFR 79.34 07/14/2023 1203   EGFR 60 06/22/2022 1122   GFRNONAA >60 02/28/2024 1017      Component Value Date/Time   CHOL 129  07/14/2023 1203   TRIG 99.0 07/14/2023 1203   HDL 44.50 07/14/2023 1203   CHOLHDL 3 07/14/2023 1203   VLDL 19.8 07/14/2023 1203   LDLCALC 64 07/14/2023 1203    Assessment/Plan   Diabetes Management Plan -Currently controlled based on A1c <7% -Last LDL at goal of <70 -BP at goal of <130/80 -Last UACR at goal -Continue Farxiga  5mg  daily -Continue Rybelsus  3mg  daily for a total of 1 month, then increase to 7mg  daily.  Patient will pick up the 3 boxes of 7mg  as soon as she is able. -Resume home monitoring of FBG -Patient will not be able to receive Rybelsus  through [facility] after 1/1, but we completed [employer] Medicare Application >1 month ago; she has not yet received a determination letter from [facility].  If approved, copay for Rybelsus  would be around $12.  -Patient was scheduled to see Dr. Sebastian in September, but appointment was reschedule and then canceled.  She is due for a follow-up and A1c, CMP, UACR, and lipid panel.  Does not currently have insurance, but this will be reinstated after the 1st of the year, and she will schedule a follow-up with Dr. Sebastian at that time.  Blood Pressure Control -Continue current regimen at this time -Patient plans to purchase automated, upper arm BP monitor to begin checking home BP regualrly -If BP continues to run low, or if patient begins  to experience any s/sx of hypotension, I would recommend either decreasing amlodipine  to 2.5mg  daily or discontinuing    Follow-up:  1 month   Kelli Ruiz, PharmD, DPLA  "

## 2024-05-15 ENCOUNTER — Other Ambulatory Visit (INDEPENDENT_AMBULATORY_CARE_PROVIDER_SITE_OTHER): Payer: Self-pay

## 2024-05-15 DIAGNOSIS — J302 Other seasonal allergic rhinitis: Secondary | ICD-10-CM

## 2024-05-15 DIAGNOSIS — R0989 Other specified symptoms and signs involving the circulatory and respiratory systems: Secondary | ICD-10-CM

## 2024-05-16 MED ORDER — ACCU-CHEK GUIDE TEST VI STRP: ORAL_STRIP | 0 refills | Status: AC

## 2024-05-16 MED ORDER — FLUTICASONE PROPIONATE 50 MCG/ACT NA SUSP: 2.0000 | Freq: Every day | NASAL | 0 refills | Status: AC | PRN

## 2024-05-16 MED ORDER — ALBUTEROL SULFATE HFA 108 (90 BASE) MCG/ACT IN AERS: 2.0000 | INHALATION_SPRAY | Freq: Four times a day (QID) | RESPIRATORY_TRACT | 0 refills | Status: AC | PRN

## 2024-05-21 ENCOUNTER — Other Ambulatory Visit: Payer: Self-pay | Admitting: Family Medicine

## 2024-05-21 DIAGNOSIS — J302 Other seasonal allergic rhinitis: Secondary | ICD-10-CM

## 2024-05-21 DIAGNOSIS — R0989 Other specified symptoms and signs involving the circulatory and respiratory systems: Secondary | ICD-10-CM

## 2024-05-29 ENCOUNTER — Ambulatory Visit: Payer: Self-pay

## 2024-05-29 NOTE — Telephone Encounter (Signed)
 FYI Only or Action Required?: Action required by provider: declined OV, requesting medication.  Patient was last seen in primary care on 11/03/2023 by Kelli Beverley NOVAK, MD.  Called Nurse Triage reporting Insomnia and Depression.  Symptoms began a week ago.  Interventions attempted: Prescription medications: Celexa  40 MG and Other: exercise.  Symptoms are: gradually worsening.  Triage Disposition: See Physician Within 24 Hours  Patient/caregiver understands and will follow disposition?: No, refuses disposition                                  1. CONCERN: What happened that made you call today?     Difficulty sleeping, emotional roller coaster 2. DEPRESSION SYMPTOM SCREENING: How are you feeling overall? (e.g., decreased energy, increased sleeping or difficulty sleeping, difficulty concentrating, feelings of sadness, guilt, hopelessness, or worthlessness)     Difficulty sleeping 3. RISK OF HARM - SUICIDAL IDEATION:  Do you ever have thoughts of hurting or killing yourself?  (e.g., yes, no, no but preoccupation with thoughts about death)     Denies 4. RISK OF HARM - HOMICIDAL IDEATION:  Do you ever have thoughts of hurting or killing someone else?  (e.g., yes, no, no but preoccupation with thoughts about death)     Denies 6. SUPPORT: Who is with you now? Who do you live with? Do you have family or friends who you can talk to?      Lives with a roommate  8. STRESSORS: Has there been any new stress or recent changes in your life?     Passing of her dad  10. OTHER: Do you have any other physical symptoms right now? (e.g., fever)     Shakiness, due to lack of sleep and stress, lack of appetite, due to diabetes medications, uncontrollable crying    This RN advised in-person evaluation. Patient declined OV because she does not feel comfortable driving with lack of sleep. Patient is requesting a mild medication to help her sleep. Please  advise.   Reason for Disposition  [1] Depression AND [2] getting worse (e.g., sleeping poorly, less able to do activities of daily living)  Protocols used: Depression-A-AH  Copied from CRM #[id number]. Topic: Clinical - Red Word Triage >> May 29, 2024 10:09 AM Kelli Ruiz wrote: Red Word that prompted transfer to Nurse Triage: has barley slept since Jan 6th, has been a comptroller, has only been able to get about an hour or two of sleep. feels down, lost her father. requesting medication to help her sleep. Trying to not cry and get some sleep.

## 2024-05-29 NOTE — Telephone Encounter (Signed)
 Spoke with pt, scheduled pt a follow up appointment 06/06/2024. Pt is requesting a medication be sent in today (to the [facility] in [place], [address], pt was in tears on the phone. Pt states a form was faxed over and needs to be filled out before next OV Porsha and I looked for the form but do not see any forms that have been revived from [facility]. Please advise.

## 2024-05-29 NOTE — Telephone Encounter (Signed)
 Spoke with pt, pt is aware and expressed understanding. Pt states she will bring forms with her at upcoming appointment. Thank you!

## 2024-06-04 ENCOUNTER — Telehealth: Payer: Self-pay | Admitting: Adult Health

## 2024-06-04 NOTE — Telephone Encounter (Signed)
 Patient reschedule appointment due to will be out of town

## 2024-06-06 ENCOUNTER — Ambulatory Visit: Admitting: Family Medicine

## 2024-06-11 ENCOUNTER — Telehealth: Payer: Self-pay

## 2024-06-11 NOTE — Progress Notes (Signed)
" ° °  06/11/2024  Patient ID: Kelli Ruiz, female   DOB: 1955-12-30, 69 y.o.   MRN: [id number]  Outreach attempt to reschedule upcoming telephone follow-up based on meeting I will be in during that time.  I was not able to reach the patient, but I did leave a HIPAA compliant voicemail with my direct number.  I will try to call her next week if I do not hear back.  Kelli Ruiz, PharmD, DPLA  "

## 2024-06-12 ENCOUNTER — Ambulatory Visit

## 2024-06-14 ENCOUNTER — Ambulatory Visit: Payer: PRIVATE HEALTH INSURANCE | Admitting: Adult Health

## 2024-06-15 ENCOUNTER — Telehealth: Payer: Self-pay

## 2024-06-15 NOTE — Progress Notes (Unsigned)
 Pharmacy Quality Measure Review  This patient is appearing on a report for being at risk of failing the adherence measure for diabetes medications this calendar year.   Medication: Pioglitazone  15mg  tablet Last fill date: 12/14/2023 for 30 day supply  Pioglitazone  was discontinued in June of 2025 due to risk of hypoglycemia. Patient should not fall on report for the current year (2026).  Lisle Slocumb Student - PharmD.

## 2024-06-19 ENCOUNTER — Other Ambulatory Visit

## 2024-06-20 ENCOUNTER — Other Ambulatory Visit

## 2024-06-20 DIAGNOSIS — E119 Type 2 diabetes mellitus without complications: Secondary | ICD-10-CM

## 2024-06-20 DIAGNOSIS — E1169 Type 2 diabetes mellitus with other specified complication: Secondary | ICD-10-CM

## 2024-06-20 DIAGNOSIS — E1159 Type 2 diabetes mellitus with other circulatory complications: Secondary | ICD-10-CM

## 2024-06-22 ENCOUNTER — Telehealth: Payer: Self-pay | Admitting: Family

## 2024-06-22 NOTE — Telephone Encounter (Signed)
 Returning pts voicemail to r/s upcoming appt. LVM to return call for rescheduling.

## 2024-06-29 ENCOUNTER — Inpatient Hospital Stay: Attending: Hematology & Oncology

## 2024-06-29 ENCOUNTER — Inpatient Hospital Stay: Admitting: Family

## 2024-07-02 ENCOUNTER — Ambulatory Visit: Payer: PRIVATE HEALTH INSURANCE | Admitting: Adult Health

## 2024-07-25 ENCOUNTER — Other Ambulatory Visit

## 2024-08-07 ENCOUNTER — Ambulatory Visit

## 2025-01-01 ENCOUNTER — Ambulatory Visit: Payer: PRIVATE HEALTH INSURANCE | Admitting: Adult Health
